# Patient Record
Sex: Male | Born: 1950
Health system: Southern US, Community
[De-identification: ages and names within clinical notes are randomized; demographics above are authoritative.]

## PROBLEM LIST (undated history)

## (undated) DIAGNOSIS — I209 Angina pectoris, unspecified: Secondary | ICD-10-CM

## (undated) DIAGNOSIS — K219 Gastro-esophageal reflux disease without esophagitis: Secondary | ICD-10-CM

## (undated) DIAGNOSIS — M1990 Unspecified osteoarthritis, unspecified site: Secondary | ICD-10-CM

## (undated) DIAGNOSIS — E785 Hyperlipidemia, unspecified: Secondary | ICD-10-CM

## (undated) HISTORY — DX: Gastro-esophageal reflux disease without esophagitis: K21.9

## (undated) HISTORY — DX: Unspecified osteoarthritis, unspecified site: M19.90

## (undated) HISTORY — DX: Hyperlipidemia, unspecified: E78.5

---

## 1997-07-05 HISTORY — PX: APPENDECTOMY: SHX54

## 2010-09-09 LAB — HM COLONOSCOPY

## 2011-08-25 LAB — PSA: PSA: 0.8

## 2012-07-06 ENCOUNTER — Ambulatory Visit: Payer: Self-pay | Admitting: Family Medicine

## 2012-07-07 ENCOUNTER — Ambulatory Visit: Payer: Self-pay | Admitting: Family Medicine

## 2012-10-10 ENCOUNTER — Ambulatory Visit: Payer: Self-pay | Admitting: Family Medicine

## 2013-02-26 ENCOUNTER — Encounter: Payer: Self-pay | Admitting: *Deleted

## 2013-03-14 ENCOUNTER — Ambulatory Visit (INDEPENDENT_AMBULATORY_CARE_PROVIDER_SITE_OTHER): Payer: BC Managed Care – PPO | Admitting: General Surgery

## 2013-03-14 ENCOUNTER — Encounter: Payer: Self-pay | Admitting: General Surgery

## 2013-03-14 VITALS — BP 142/76 | HR 68 | Resp 14 | Ht 72.0 in | Wt 214.0 lb

## 2013-03-14 DIAGNOSIS — K8020 Calculus of gallbladder without cholecystitis without obstruction: Secondary | ICD-10-CM | POA: Insufficient documentation

## 2013-03-14 NOTE — Patient Instructions (Addendum)
Patient to return as needed. 

## 2013-03-14 NOTE — Progress Notes (Signed)
Patient ID: Ryan English, male   DOB: Apr 05, 1951, 62 y.o.   MRN: [id number]  Chief Complaint  Patient presents with  . Other    evaluation of gallstones    HPI Ryan English is a 62 y.o. male who presents for an evaluation of gallstones. The patient was referred by Dr. Elease Hashimoto. The patient went for a Chest CT in January 2014 where gallstones were present. The patient denies any problems or symptoms at this time.  On questioning the patient has no dietary intolerance. He has not appreciated postprandial belching, distention, diarrhea or pain.  HPI  Past Medical History  Diagnosis Date  . GERD (gastroesophageal reflux disease)   . Arthritis   . Hyperlipidemia     Past Surgical History  Procedure Laterality Date  . Appendectomy  1999    History reviewed. No pertinent family history.  Social History History  Substance Use Topics  . Smoking status: Never Smoker   . Smokeless tobacco: Not on file  . Alcohol Use: Yes    No Known Allergies  Current Outpatient Prescriptions  Medication Sig Dispense Refill  . aspirin 81 MG tablet Take 81 mg by mouth daily.      Marland Kitchen atorvastatin (LIPITOR) 40 MG tablet Take 1 tablet by mouth daily.      . Misc Natural Products (OSTEO BI-FLEX JOINT SHIELD PO) Take 2 tablets by mouth daily.      . Omega-3 Fatty Acids (FISH OIL) 1200 MG CAPS Take 1 capsule by mouth daily.      Marland Kitchen omeprazole (PRILOSEC) 20 MG capsule Take 20 mg by mouth daily.      Marland Kitchen Phenylephrine-DM-GG-APAP (SUDAFED PE COLD/COUGH PO) Take 10 mg by mouth daily.       No current facility-administered medications for this visit.    Review of Systems Review of Systems  Constitutional: Negative.   Respiratory: Negative.   Cardiovascular: Negative.   Gastrointestinal: Negative.     Blood pressure 142/76, pulse 68, resp. rate 14, height 6' (1.829 m), weight 214 lb (97.07 kg).  Physical Exam Physical Exam  Constitutional: He is oriented to person, place, and time. He appears  well-developed and well-nourished.  Neck: No thyromegaly present.  Cardiovascular: Normal rate, regular rhythm and normal heart sounds.   No murmur heard. Pulmonary/Chest: Effort normal and breath sounds normal.  Abdominal: Soft. Bowel sounds are normal. There is no tenderness.  Lymphadenopathy:    He has no cervical adenopathy.  Neurological: He is alert and oriented to person, place, and time.  Skin: Skin is warm and dry.    Data Reviewed PCP Notes.  CT scan dated 07/07/2012 completed for a cough and fever showed no evidence of pulmonary infiltrate. Gallstones were identified. 1.3 cm right adrenal nodule noted for which MRI was suggested multiple pinpoint pulmonary nodules consistent with granulomas.  Review of the CT showed the gallstones to be calcified suggesting that they've been there for quite some time.    Assessment    Cholelithiasis without any symptoms to suggest cholecystitis.    Plan    The patient is in good health, totally asymptomatic and with no history of diabetes. Typical symptoms to suggest the gallstones are becoming symptomatic were reviewed. Should he experience any symptoms he was encouraged to call for assessment, but otherwise would defer elective cholecystectomy.       Ryan English 03/16/2013, 5:00 PM

## 2013-03-16 ENCOUNTER — Encounter: Payer: Self-pay | Admitting: General Surgery

## 2013-05-08 ENCOUNTER — Ambulatory Visit: Payer: Self-pay | Admitting: Family Medicine

## 2013-08-28 ENCOUNTER — Ambulatory Visit: Payer: Self-pay | Admitting: Family Medicine

## 2014-06-21 ENCOUNTER — Other Ambulatory Visit: Payer: Self-pay | Admitting: Orthopaedic Surgery

## 2014-06-21 DIAGNOSIS — M4716 Other spondylosis with myelopathy, lumbar region: Secondary | ICD-10-CM

## 2014-06-25 ENCOUNTER — Ambulatory Visit
Admission: RE | Admit: 2014-06-25 | Discharge: 2014-06-25 | Disposition: A | Discharge status: Home / Self Care | Payer: BC Managed Care – PPO | Source: Ambulatory Visit | Attending: Orthopaedic Surgery | Admitting: Orthopaedic Surgery

## 2014-06-25 DIAGNOSIS — M4716 Other spondylosis with myelopathy, lumbar region: Secondary | ICD-10-CM

## 2014-08-02 DIAGNOSIS — R001 Bradycardia, unspecified: Secondary | ICD-10-CM | POA: Insufficient documentation

## 2014-08-02 DIAGNOSIS — E782 Mixed hyperlipidemia: Secondary | ICD-10-CM | POA: Insufficient documentation

## 2014-12-31 ENCOUNTER — Ambulatory Visit: Payer: Self-pay

## 2015-04-09 ENCOUNTER — Telehealth: Payer: Self-pay | Admitting: Family Medicine

## 2015-04-09 DIAGNOSIS — J3089 Other allergic rhinitis: Secondary | ICD-10-CM

## 2015-04-09 DIAGNOSIS — J309 Allergic rhinitis, unspecified: Secondary | ICD-10-CM | POA: Insufficient documentation

## 2015-04-09 MED ORDER — MONTELUKAST SODIUM 10 MG PO TABS: 10.0000 mg | ORAL_TABLET | Freq: Every day | ORAL | Status: DC

## 2015-04-09 NOTE — Telephone Encounter (Signed)
Pt is returning call.  CB#[phone]/MW

## 2015-04-09 NOTE — Telephone Encounter (Signed)
Pt states he has some drainage and coughing with no fever.Pt would like to see if you would be willing to call in something for him to [facility].

## 2015-04-09 NOTE — Telephone Encounter (Signed)
Patient reports that he has PND, scratchy throat, runny nose, and cough X 3 days. Patient denies any fever or shortness of breath. Patient reports that the drainage is his main complaint. He denies any ear pain or itchy watery eyes. He has not been taking anything OTC for symptoms. Patient is wondering if something can be called  Into the pharmacy. [facility] [place]. Please advise. Thanks!

## 2015-04-11 NOTE — Telephone Encounter (Signed)
See other note; this is completed.   Thanks,   -Mickel Baas

## 2015-05-26 ENCOUNTER — Other Ambulatory Visit: Payer: Self-pay

## 2015-05-26 ENCOUNTER — Other Ambulatory Visit
Admission: RE | Admit: 2015-05-26 | Discharge: 2015-05-26 | Disposition: A | Discharge status: Home / Self Care | Payer: BLUE CROSS/BLUE SHIELD | Source: Ambulatory Visit | Attending: Family Medicine | Admitting: Family Medicine

## 2015-05-26 ENCOUNTER — Encounter: Payer: Self-pay | Admitting: Family Medicine

## 2015-05-26 ENCOUNTER — Telehealth: Payer: Self-pay

## 2015-05-26 ENCOUNTER — Ambulatory Visit (INDEPENDENT_AMBULATORY_CARE_PROVIDER_SITE_OTHER): Payer: BLUE CROSS/BLUE SHIELD | Admitting: Family Medicine

## 2015-05-26 VITALS — BP 128/66 | HR 60 | Temp 97.7°F | Resp 16 | Ht 71.5 in | Wt 214.0 lb

## 2015-05-26 DIAGNOSIS — R0789 Other chest pain: Secondary | ICD-10-CM | POA: Insufficient documentation

## 2015-05-26 DIAGNOSIS — D3500 Benign neoplasm of unspecified adrenal gland: Secondary | ICD-10-CM | POA: Insufficient documentation

## 2015-05-26 DIAGNOSIS — R001 Bradycardia, unspecified: Secondary | ICD-10-CM | POA: Diagnosis not present

## 2015-05-26 DIAGNOSIS — I2510 Atherosclerotic heart disease of native coronary artery without angina pectoris: Secondary | ICD-10-CM

## 2015-05-26 DIAGNOSIS — K649 Unspecified hemorrhoids: Secondary | ICD-10-CM | POA: Insufficient documentation

## 2015-05-26 DIAGNOSIS — H6990 Unspecified Eustachian tube disorder, unspecified ear: Secondary | ICD-10-CM | POA: Insufficient documentation

## 2015-05-26 DIAGNOSIS — H6980 Other specified disorders of Eustachian tube, unspecified ear: Secondary | ICD-10-CM | POA: Insufficient documentation

## 2015-05-26 DIAGNOSIS — K8020 Calculus of gallbladder without cholecystitis without obstruction: Secondary | ICD-10-CM | POA: Insufficient documentation

## 2015-05-26 LAB — TROPONIN I

## 2015-05-26 LAB — CKMB (ARMC ONLY): CK, MB: 2.1 ng/mL (ref 0.5–5.0)

## 2015-05-26 NOTE — Telephone Encounter (Signed)
Pt called c/o chest pain x 4-5 days. Pt reports it is left sided, and is a constant "pressure". Denies crushing sensation. Reports it is relieved with applying pressure to the chest. Pt also experiencing lightheadedness/dizziness. Denies N/V. Pt reports he has left arm arthritis, and could not tell me if arm pain is abnormal or worsening. Pt also is unsure if he is experiencing SOB. Pt reports he is stable, and only called [place] at his wife's urging. Advised pt to check BP, and to report to ED if sx worsen. Appointment made for 12:00 today. Renaldo Fiddler, CMA

## 2015-05-26 NOTE — Progress Notes (Signed)
Name: ESTELLA VERDIER   MRN: [id number]    DOB: June 07, 1951   Date:05/26/2015       Progress Note  Subjective  Chief Complaint  Chief Complaint  Patient presents with  . Chest Pain    HPI  64 yo male started with ache in left chest since last Tuesday.  Arm pain has not changed. Notices it more in the evening.  Is light headed. Feels like he is in a fog. Dizzy when closing eyes. Walking fine.   Notices it more with sitting on the couch. No sharp pain. Constant ache.  Rubbing it helps.  Makes it feel better.  Does not make any difference with activity.  No SOB.  No nausea. No jaw pain.  Does not impact his ADLs. Kept grandkids this week.  Did see cardiology earlier this year.  Had stress test twice.  Not had cath.  No reflux.  Has minimal coronary artery disease and bradycardia.  Does feel better with pressure.     Past Medical History  Diagnosis Date  . GERD (gastroesophageal reflux disease)   . Arthritis   . Hyperlipidemia     Social History  Substance Use Topics  . Smoking status: Never Smoker   . Smokeless tobacco: Never Used  . Alcohol Use: Yes     Current outpatient prescriptions:  .  aspirin 81 MG tablet, Take 81 mg by mouth daily., Disp: , Rfl:  .  atorvastatin (LIPITOR) 40 MG tablet, Take 1 tablet by mouth daily., Disp: , Rfl:  .  cyclobenzaprine (FLEXERIL) 10 MG tablet, Take 10 mg by mouth 3 (three) times daily as needed for muscle spasms., Disp: , Rfl:  .  gabapentin (NEURONTIN) 300 MG capsule, , Disp: , Rfl: 1 .  loratadine (CLARITIN) 10 MG tablet, Take by mouth., Disp: , Rfl:  .  meloxicam (MOBIC) 15 MG tablet, Take by mouth., Disp: , Rfl:  .  Misc Natural Products (OSTEO BI-FLEX JOINT SHIELD PO), Take 2 tablets by mouth daily., Disp: , Rfl:  .  montelukast (SINGULAIR) 10 MG tablet, Take 1 tablet (10 mg total) by mouth at bedtime., Disp: 30 tablet, Rfl: 3 .  Omega-3 Fatty Acids (FISH OIL) 1200 MG CAPS, Take 1 capsule by mouth daily., Disp: , Rfl:  .  omeprazole  (PRILOSEC) 20 MG capsule, Take 20 mg by mouth daily., Disp: , Rfl:  .  Phenylephrine-DM-GG-APAP (SUDAFED PE COLD/COUGH PO), Take 10 mg by mouth daily., Disp: , Rfl:   No Known Allergies  Review of Systems  Constitutional: Negative.   HENT: Negative.   Cardiovascular: Positive for chest pain. Negative for palpitations, orthopnea, claudication and leg swelling.  Musculoskeletal: Positive for myalgias. Negative for back pain and joint pain.  Neurological: Positive for dizziness.  Psychiatric/Behavioral: Negative.     Objective  Filed Vitals:   05/26/15 1229  BP: 128/66  Pulse: 60  Temp: 97.7 F (36.5 C)  TempSrc: Oral  Resp: 16  Height: 5' 11.5" (1.816 m)  Weight: 214 lb (97.07 kg)     Physical Exam  Constitutional: He is oriented to person, place, and time and well-developed, well-nourished, and in no distress.  HENT:  Head: Normocephalic and atraumatic.  Right Ear: External ear normal.  Left Ear: External ear normal.  Nose: Nose normal.  Mouth/Throat: Oropharynx is clear and moist.  Eyes: Pupils are equal, round, and reactive to light.  Neck: Normal range of motion. Neck supple.  Cardiovascular: Normal rate and regular rhythm.   Pulmonary/Chest: Effort normal.  Musculoskeletal: He exhibits tenderness.  Left chest wall in area where he describes the pain. Does duplicate and worsen pain he has come in for.   Neurological: He is alert and oriented to person, place, and time.  Skin: Skin is warm and dry.  Psychiatric: Mood, memory, affect and judgment normal.   1. Atypical chest pain New problem. EKG unchanged from previous. Has known history of bradycardia.  Suspect chest wall pain. Will treat as needed if does not resolve. Will check enzymes to rule out cardiac etiology although suspicion is low.   - EKG 12-Lead  2. Atherosclerosis of native coronary artery of native heart without angina pectoris Does have mild plaque on CT. Strong FH.  Normal stress tests. Will check  enzymes to be safe.  Ordered stat CK- MB and troponin.  Further plan pending these results.  Continue current medication.     3. Bradycardia Asymptomatic.  Will refer as needed.    Margarita Rana, MD

## 2015-06-06 ENCOUNTER — Emergency Department
Admission: EM | Admit: 2015-06-06 | Discharge: 2015-06-06 | Disposition: A | Discharge status: Home / Self Care | Payer: BLUE CROSS/BLUE SHIELD | Attending: Emergency Medicine | Admitting: Emergency Medicine

## 2015-06-06 ENCOUNTER — Telehealth: Payer: Self-pay

## 2015-06-06 ENCOUNTER — Emergency Department: Payer: BLUE CROSS/BLUE SHIELD

## 2015-06-06 DIAGNOSIS — R079 Chest pain, unspecified: Secondary | ICD-10-CM | POA: Diagnosis present

## 2015-06-06 DIAGNOSIS — Z791 Long term (current) use of non-steroidal anti-inflammatories (NSAID): Secondary | ICD-10-CM | POA: Insufficient documentation

## 2015-06-06 DIAGNOSIS — Z7982 Long term (current) use of aspirin: Secondary | ICD-10-CM | POA: Insufficient documentation

## 2015-06-06 DIAGNOSIS — R42 Dizziness and giddiness: Secondary | ICD-10-CM | POA: Diagnosis not present

## 2015-06-06 LAB — CBC
HCT: 41.1 % (ref 40.0–52.0)
HEMOGLOBIN: 14.4 g/dL (ref 13.0–18.0)
MCH: 29.7 pg (ref 26.0–34.0)
MCHC: 35.1 g/dL (ref 32.0–36.0)
MCV: 84.6 fL (ref 80.0–100.0)
Platelets: 165 10*3/uL (ref 150–440)
RBC: 4.85 MIL/uL (ref 4.40–5.90)
RDW: 13.2 % (ref 11.5–14.5)
WBC: 6.2 10*3/uL (ref 3.8–10.6)

## 2015-06-06 LAB — BASIC METABOLIC PANEL
ANION GAP: 7 (ref 5–15)
BUN: 13 mg/dL (ref 6–20)
CHLORIDE: 107 mmol/L (ref 101–111)
CO2: 26 mmol/L (ref 22–32)
Calcium: 9.3 mg/dL (ref 8.9–10.3)
Creatinine, Ser: 0.86 mg/dL (ref 0.61–1.24)
GFR calc Af Amer: >60 mL/min (ref >60)
Glucose, Bld: 83 mg/dL (ref 65–99)
POTASSIUM: 3.8 mmol/L (ref 3.5–5.1)
SODIUM: 140 mmol/L (ref 135–145)

## 2015-06-06 LAB — TROPONIN I

## 2015-06-06 NOTE — ED Notes (Signed)
Patient transported to X-ray 

## 2015-06-06 NOTE — ED Notes (Signed)
Pt in via triage w/ intermittent chest pain x 1 week.  Pt reports dizziness and some SOB w/ pain.  Pt A/Ox4, vitals WDL, no immediate distress at this time.

## 2015-06-06 NOTE — Discharge Instructions (Signed)
Please seek medical attention for any high fevers, chest pain, shortness of breath, change in behavior, persistent vomiting, bloody stool or any other new or concerning symptoms. ° ° °Nonspecific Chest Pain  °Chest pain can be caused by many different conditions. There is always a chance that your pain could be related to something serious, such as a heart attack or a blood clot in your lungs. Chest pain can also be caused by conditions that are not life-threatening. If you have chest pain, it is very important to follow up with your health care provider. °CAUSES  °Chest pain can be caused by: °· Heartburn. °· Pneumonia or bronchitis. °· Anxiety or stress. °· Inflammation around your heart (pericarditis) or lung (pleuritis or pleurisy). °· A blood clot in your lung. °· A collapsed lung (pneumothorax). It can develop suddenly on its own (spontaneous pneumothorax) or from trauma to the chest. °· Shingles infection (varicella-zoster virus). °· Heart attack. °· Damage to the bones, muscles, and cartilage that make up your chest wall. This can include: °¨ Bruised bones due to injury. °¨ Strained muscles or cartilage due to frequent or repeated coughing or overwork. °¨ Fracture to one or more ribs. °¨ Sore cartilage due to inflammation (costochondritis). °RISK FACTORS  °Risk factors for chest pain may include: °· Activities that increase your risk for trauma or injury to your chest. °· Respiratory infections or conditions that cause frequent coughing. °· Medical conditions or overeating that can cause heartburn. °· Heart disease or family history of heart disease. °· Conditions or health behaviors that increase your risk of developing a blood clot. °· Having had chicken pox (varicella zoster). °SIGNS AND SYMPTOMS °Chest pain can feel like: °· Burning or tingling on the surface of your chest or deep in your chest. °· Crushing, pressure, aching, or squeezing pain. °· Dull or sharp pain that is worse when you move, cough, or  take a deep breath. °· Pain that is also felt in your back, neck, shoulder, or arm, or pain that spreads to any of these areas. °Your chest pain may come and go, or it may stay constant. °DIAGNOSIS °Lab tests or other studies may be needed to find the cause of your pain. Your health care provider may have you take a test called an ambulatory ECG (electrocardiogram). An ECG records your heartbeat patterns at the time the test is performed. You may also have other tests, such as: °· Transthoracic echocardiogram (TTE). During echocardiography, sound waves are used to create a picture of all of the heart structures and to look at how blood flows through your heart. °· Transesophageal echocardiogram (TEE). This is a more advanced imaging test that obtains images from inside your body. It allows your health care provider to see your heart in finer detail. °· Cardiac monitoring. This allows your health care provider to monitor your heart rate and rhythm in real time. °· Holter monitor. This is a portable device that records your heartbeat and can help to diagnose abnormal heartbeats. It allows your health care provider to track your heart activity for several days, if needed. °· Stress tests. These can be done through exercise or by taking medicine that makes your heart beat more quickly. °· Blood tests. °· Imaging tests. °TREATMENT  °Your treatment depends on what is causing your chest pain. Treatment may include: °· Medicines. These may include: °¨ Acid blockers for heartburn. °¨ Anti-inflammatory medicine. °¨ Pain medicine for inflammatory conditions. °¨ Antibiotic medicine, if an infection is present. °¨ Medicines   to dissolve blood clots. °¨ Medicines to treat coronary artery disease. °· Supportive care for conditions that do not require medicines. This may include: °¨ Resting. °¨ Applying heat or cold packs to injured areas. °¨ Limiting activities until pain decreases. °HOME CARE INSTRUCTIONS °· If you were prescribed  an antibiotic medicine, finish it all even if you start to feel better. °· Avoid any activities that bring on chest pain. °· Do not use any tobacco products, including cigarettes, chewing tobacco, or electronic cigarettes. If you need help quitting, ask your health care provider. °· Do not drink alcohol. °· Take medicines only as directed by your health care provider. °· Keep all follow-up visits as directed by your health care provider. This is important. This includes any further testing if your chest pain does not go away. °· If heartburn is the cause for your chest pain, you may be told to keep your head raised (elevated) while sleeping. This reduces the chance that acid will go from your stomach into your esophagus. °· Make lifestyle changes as directed by your health care provider. These may include: °¨ Getting regular exercise. Ask your health care provider to suggest some activities that are safe for you. °¨ Eating a heart-healthy diet. A registered dietitian can help you to learn healthy eating options. °¨ Maintaining a healthy weight. °¨ Managing diabetes, if necessary. °¨ Reducing stress. °SEEK MEDICAL CARE IF: °· Your chest pain does not go away after treatment. °· You have a rash with blisters on your chest. °· You have a fever. °SEEK IMMEDIATE MEDICAL CARE IF:  °· Your chest pain is worse. °· You have an increasing cough, or you cough up blood. °· You have severe abdominal pain. °· You have severe weakness. °· You faint. °· You have chills. °· You have sudden, unexplained chest discomfort. °· You have sudden, unexplained discomfort in your arms, back, neck, or jaw. °· You have shortness of breath at any time. °· You suddenly start to sweat, or your skin gets clammy. °· You feel nauseous or you vomit. °· You suddenly feel light-headed or dizzy. °· Your heart begins to beat quickly, or it feels like it is skipping beats. °These symptoms may represent a serious problem that is an emergency. Do not wait to  see if the symptoms will go away. Get medical help right away. Call your local emergency services (911 in the [place] Do not drive yourself to the hospital. °  °This information is not intended to replace advice given to you by your health care provider. Make sure you discuss any questions you have with your health care provider. °  °Document Released: 03/31/2005 Document Revised: 07/12/2014 Document Reviewed: 01/25/2014 °Elsevier Interactive Patient Education ©2016 [employer] ° °

## 2015-06-06 NOTE — Telephone Encounter (Signed)
Please see if pain is with palpation, if not, needs to follow up with his cardiologist.Light headed and uneasy sounds like a change. ER if seems like symptoms have worsened. I will be unable to be reached after 3 today.   Thanks.

## 2015-06-06 NOTE — Telephone Encounter (Signed)
Patient called c/o of occasional chest pain that it bothersome. He reports that he was seen in the office on 05/26/15 and an EKG and labs were done. Patient reports that this was normal. However, patient is still experiencing pain. He denies any shortness of breath, but he does feel light-headed and un-easy. He states that the nurse at his job checked his BP and it was 140/76 which is stable for him. He is not sure of what else to do. Any recommendations? Please advise. Contact number is correct (mobile number). Thanks!

## 2015-06-06 NOTE — ED Provider Notes (Signed)
[facility] Emergency Department Provider Note    ____________________________________________  Time seen: 1700  I have reviewed the triage vital signs and the nursing notes.   HISTORY  Chief Complaint Chest Pain   History limited by: Not Limited   HPI Ryan English is a 64 y.o. male who presents to the emergency department today with concerns for intermittent chest pain and dizziness. He states that the chest pain has been over the course of the past week and a half. He states that the pain will sometimes last the full day. He states it is somewhat worse at night. He has not noticed any thing he can directly do that makes the pain better or worse. He states he has not had any associated shortness of breath. He has not had any diaphoresis. He does state that he has felt lightheaded with this pain. This morning it was the worst and he felt lightheaded and dizzy. States this has improved throughout the day.   Past Medical History  Diagnosis Date  . GERD (gastroesophageal reflux disease)   . Arthritis   . Hyperlipidemia     Patient Active Problem List   Diagnosis Date Noted  . Abnormal blood sugar 05/26/2015  . Adrenal adenoma 05/26/2015  . Benign paroxysmal positional nystagmus 05/26/2015  . Atherosclerosis of coronary artery 05/26/2015  . Biliary calculi 05/26/2015  . Dysfunction of eustachian tube 05/26/2015  . Hemorrhoid 05/26/2015  . 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 Hypercholesterolemia 05/26/2015  . Lung nodule, multiple 05/26/2015  . Atypical chest pain 05/26/2015  . Allergic rhinitis 04/09/2015  . Bradycardia 08/02/2014  . Combined fat and carbohydrate induced hyperlipemia 08/02/2014  . Gallstone 03/14/2013    Past Surgical History  Procedure Laterality Date  . Appendectomy  1999    Current Outpatient Rx  Name  Route  Sig  Dispense  Refill  . aspirin 81 MG tablet   Oral   Take 81 mg by mouth daily.         Marland Kitchen atorvastatin  (LIPITOR) 40 MG tablet   Oral   Take 1 tablet by mouth daily.         . cyclobenzaprine (FLEXERIL) 10 MG tablet   Oral   Take 10 mg by mouth 3 (three) times daily as needed for muscle spasms.         Marland Kitchen gabapentin (NEURONTIN) 300 MG capsule            1   . loratadine (CLARITIN) 10 MG tablet   Oral   Take by mouth.         . meloxicam (MOBIC) 15 MG tablet   Oral   Take by mouth.         . Misc Natural Products (OSTEO BI-FLEX JOINT SHIELD PO)   Oral   Take 2 tablets by mouth daily.         . montelukast (SINGULAIR) 10 MG tablet   Oral   Take 1 tablet (10 mg total) by mouth at bedtime.   30 tablet   3   . Omega-3 Fatty Acids (FISH OIL) 1200 MG CAPS   Oral   Take 1 capsule by mouth daily.         Marland Kitchen omeprazole (PRILOSEC) 20 MG capsule   Oral   Take 20 mg by mouth daily.         Marland Kitchen Phenylephrine-DM-GG-APAP (SUDAFED PE COLD/COUGH PO)   Oral   Take 10 mg by mouth daily.  Allergies Review of patient's allergies indicates no known allergies.  History reviewed. No pertinent family history.  Social History Social History  Substance Use Topics  . Smoking status: Never Smoker   . Smokeless tobacco: Never Used  . Alcohol Use: Yes    Review of Systems  Constitutional: Negative for fever. Cardiovascular: Positive for chest pain. Respiratory: Negative for shortness of breath. Gastrointestinal: Negative for abdominal pain, vomiting and diarrhea. Genitourinary: Negative for dysuria. Musculoskeletal: Negative for back pain. Skin: Negative for rash. Neurological: Negative for headaches, focal weakness or numbness. Positive for dizziness.  10-point ROS otherwise negative.  ____________________________________________   PHYSICAL EXAM:  VITAL SIGNS: ED Triage Vitals  Enc Vitals Group     BP 06/06/15 1606 134/81 mmHg     Pulse Rate 06/06/15 1606 65     Resp 06/06/15 1606 16     Temp 06/06/15 1606 98.1 F (36.7 C)     Temp src --       SpO2 06/06/15 1606 95 %     Weight 06/06/15 1606 212 lb (96.163 kg)     Height 06/06/15 1606 5\' 11"  (1.803 m)     Head Cir --      Peak Flow --      Pain Score 06/06/15 1605 5   Constitutional: Alert and oriented. Well appearing and in no distress. Eyes: Conjunctivae are normal. PERRL. Normal extraocular movements. ENT   Head: Normocephalic and atraumatic.   Nose: No congestion/rhinnorhea.   Mouth/Throat: Mucous membranes are moist.   Neck: No stridor. Hematological/Lymphatic/Immunilogical: No cervical lymphadenopathy. Cardiovascular: Normal rate, regular rhythm.  No murmurs, rubs, or gallops. Respiratory: Normal respiratory effort without tachypnea nor retractions. Breath sounds are clear and equal bilaterally. No wheezes/rales/rhonchi. Gastrointestinal: Soft and nontender. No distention. Genitourinary: Deferred Musculoskeletal: Normal range of motion in all extremities. No joint effusions.  No lower extremity tenderness nor edema. Neurologic:  Normal speech and language. No gross focal neurologic deficits are appreciated.  Skin:  Skin is warm, dry and intact. No rash noted. Psychiatric: Mood and affect are normal. Speech and behavior are normal. Patient exhibits appropriate insight and judgment.  ____________________________________________    LABS (pertinent positives/negatives)  Labs Reviewed  CBC  BASIC METABOLIC PANEL  TROPONIN I     ____________________________________________   EKG  I, Nance Pear, attending physician, personally viewed and interpreted this EKG  EKG Time: 1600 Rate: 65 Rhythm: NSr Axis: normal Intervals: qtc 426 QRS: narrow ST changes: no st elevation Impression: normal ekg ____________________________________________    RADIOLOGY  CXR IMPRESSION: Minimal chronic bronchitic changes without acute infiltrate.   ____________________________________________   PROCEDURES  Procedure(s) performed: None  Critical  Care performed: No  ____________________________________________   INITIAL IMPRESSION / ASSESSMENT AND PLAN / ED COURSE  Pertinent labs & imaging results that were available during my care of the patient were reviewed by me and considered in my medical decision making (see chart for details).  Patient presented to the emergency department today with 1-1/2 week concern of intermittent chest pain with dizziness. 2 sets of troponin negative. Patient's EKG without any ST elevation. Discussed with patient that I would like to follow up with cardiology although given her workup today think ACS unlikely. No other clear etiology of the patient's symptoms. Encourage primary care follow-up.  ____________________________________________   FINAL CLINICAL IMPRESSION(S) / ED DIAGNOSES  Final diagnoses:  Chest pain, unspecified chest pain type  Dizziness     Nance Pear, MD 06/06/15 2007

## 2015-06-06 NOTE — ED Notes (Addendum)
Patient comes in with intermittent chest pain for 1.5 weeks, but today chest pain has gotten worse and is associated with dizziness today.  Patient complains of weakness with left sided chest pain that radiates to left arm.  Patient denies nausea or vomiting.

## 2015-06-06 NOTE — Telephone Encounter (Signed)
I spoke with [name], he says he does have some shortness of breath, he says it could be a cold though.  But he is becoming more dizzy and unsteady.  I suggested that he should go to the ER and be evaluated and he agreed to go.  I called over the ER Triage nurse and advised her he was on his way.   Thanks,   -Mickel Baas

## 2015-06-07 NOTE — Telephone Encounter (Signed)
Spoke with patient and he reports that he has still had some dizzy spells. However, they are much better than yesterday. He reports that they did draw labs and did tests which he does not know the results on. He reports that they did not schedule his appt with cardiology because it was after 8 pm when he was discharged. Patient reports that he will call 1st thing on Monday to schedule appt. He sends his thanks for calling and checking on him.

## 2015-06-07 NOTE — Telephone Encounter (Signed)
Patient discharged from ER Friday.  Sounds like need follow up with Dr. Nehemiah Massed. Please see if this has been scheduled. Thanks.

## 2015-06-16 ENCOUNTER — Other Ambulatory Visit: Payer: Self-pay | Admitting: Orthopedic Surgery

## 2015-06-16 DIAGNOSIS — M4316 Spondylolisthesis, lumbar region: Secondary | ICD-10-CM

## 2015-06-26 ENCOUNTER — Ambulatory Visit
Admission: RE | Admit: 2015-06-26 | Discharge: 2015-06-26 | Disposition: A | Discharge status: Home / Self Care | Payer: BLUE CROSS/BLUE SHIELD | Source: Ambulatory Visit | Attending: Orthopedic Surgery | Admitting: Orthopedic Surgery

## 2015-06-26 DIAGNOSIS — M4316 Spondylolisthesis, lumbar region: Secondary | ICD-10-CM

## 2015-07-31 ENCOUNTER — Other Ambulatory Visit: Payer: Self-pay | Admitting: Family Medicine

## 2015-07-31 DIAGNOSIS — J3089 Other allergic rhinitis: Secondary | ICD-10-CM

## 2015-08-12 ENCOUNTER — Ambulatory Visit (INDEPENDENT_AMBULATORY_CARE_PROVIDER_SITE_OTHER): Payer: BLUE CROSS/BLUE SHIELD | Admitting: Physician Assistant

## 2015-08-12 ENCOUNTER — Encounter: Payer: Self-pay | Admitting: Physician Assistant

## 2015-08-12 VITALS — BP 128/72 | HR 70 | Temp 98.3°F | Resp 16 | Wt 218.0 lb

## 2015-08-12 DIAGNOSIS — N4829 Other inflammatory disorders of penis: Secondary | ICD-10-CM | POA: Diagnosis not present

## 2015-08-12 DIAGNOSIS — J069 Acute upper respiratory infection, unspecified: Secondary | ICD-10-CM | POA: Diagnosis not present

## 2015-08-12 MED ORDER — AZITHROMYCIN 250 MG PO TABS: ORAL_TABLET | ORAL | Status: DC

## 2015-08-12 MED ORDER — BACITRACIN 500 UNIT/GM OP OINT: TOPICAL_OINTMENT | OPHTHALMIC | Status: DC

## 2015-08-12 NOTE — Patient Instructions (Signed)
Upper Respiratory Infection, Adult Most upper respiratory infections (URIs) are a viral infection of the air passages leading to the lungs. A URI affects the nose, throat, and upper air passages. The most common type of URI is nasopharyngitis and is typically referred to as "the common cold." URIs run their course and usually go away on their own. Most of the time, a URI does not require medical attention, but sometimes a bacterial infection in the upper airways can follow a viral infection. This is called a secondary infection. Sinus and middle ear infections are common types of secondary upper respiratory infections. Bacterial pneumonia can also complicate a URI. A URI can worsen asthma and chronic obstructive pulmonary disease (COPD). Sometimes, these complications can require emergency medical care and may be life threatening.  CAUSES Almost all URIs are caused by viruses. A virus is a type of germ and can spread from one person to another.  RISKS FACTORS You may be at risk for a URI if:   You smoke.   You have chronic heart or lung disease.  You have a weakened defense (immune) system.   You are very young or very old.   You have nasal allergies or asthma.  You work in crowded or poorly ventilated areas.  You work in health care facilities or schools. SIGNS AND SYMPTOMS  Symptoms typically develop 2-3 days after you come in contact with a cold virus. Most viral URIs last 7-10 days. However, viral URIs from the influenza virus (flu virus) can last 14-18 days and are typically more severe. Symptoms may include:   Runny or stuffy (congested) nose.   Sneezing.   Cough.   Sore throat.   Headache.   Fatigue.   Fever.   Loss of appetite.   Pain in your forehead, behind your eyes, and over your cheekbones (sinus pain).  Muscle aches.  DIAGNOSIS  Your health care provider may diagnose a URI by:  Physical exam.  Tests to check that your symptoms are not due to  another condition such as:  Strep throat.  Sinusitis.  Pneumonia.  Asthma. TREATMENT  A URI goes away on its own with time. It cannot be cured with medicines, but medicines may be prescribed or recommended to relieve symptoms. Medicines may help:  Reduce your fever.  Reduce your cough.  Relieve nasal congestion. HOME CARE INSTRUCTIONS   Take medicines only as directed by your health care provider.   Gargle warm saltwater or take cough drops to comfort your throat as directed by your health care provider.  Use a warm mist humidifier or inhale steam from a shower to increase air moisture. This may make it easier to breathe.  Drink enough fluid to keep your urine clear or pale yellow.   Eat soups and other clear broths and maintain good nutrition.   Rest as needed.   Return to work when your temperature has returned to normal or as your health care provider advises. You may need to stay home longer to avoid infecting others. You can also use a face mask and careful hand washing to prevent spread of the virus.  Increase the usage of your inhaler if you have asthma.   Do not use any tobacco products, including cigarettes, chewing tobacco, or electronic cigarettes. If you need help quitting, ask your health care provider. PREVENTION  The best way to protect yourself from getting a cold is to practice good hygiene.   Avoid oral or hand contact with people with cold   symptoms.   Wash your hands often if contact occurs.  There is no clear evidence that vitamin C, vitamin E, echinacea, or exercise reduces the chance of developing a cold. However, it is always recommended to get plenty of rest, exercise, and practice good nutrition.  SEEK MEDICAL CARE IF:   You are getting worse rather than better.   Your symptoms are not controlled by medicine.   You have chills.  You have worsening shortness of breath.  You have brown or red mucus.  You have yellow or brown nasal  discharge.  You have pain in your face, especially when you bend forward.  You have a fever.  You have swollen neck glands.  You have pain while swallowing.  You have white areas in the back of your throat. SEEK IMMEDIATE MEDICAL CARE IF:   You have severe or persistent:  Headache.  Ear pain.  Sinus pain.  Chest pain.  You have chronic lung disease and any of the following:  Wheezing.  Prolonged cough.  Coughing up blood.  A change in your usual mucus.  You have a stiff neck.  You have changes in your:  Vision.  Hearing.  Thinking.  Mood. MAKE SURE YOU:   Understand these instructions.  Will watch your condition.  Will get help right away if you are not doing well or get worse.   This information is not intended to replace advice given to you by your health care provider. Make sure you discuss any questions you have with your health care provider.   Document Released: 12/15/2000 Document Revised: 11/05/2014 Document Reviewed: 09/26/2013 Elsevier Interactive Patient Education 2016 [employer]  

## 2015-08-12 NOTE — Progress Notes (Signed)
Patient ID: Ryan English, male   DOB: March 27, 1951, 65 y.o.   MRN: [id number]       [name] Male    DOB: 10-06-1950   65 y.o.   MRN: [id number] Visit Date: 08/12/2015  Today's Provider: Mar Daring, PA-C   Chief Complaint  Patient presents with  . URI    X 2 days.    Subjective:    URI  This is a new problem. The current episode started in the past 7 days. The problem has been gradually worsening. There has been no fever. Associated symptoms include congestion, coughing, headaches, rhinorrhea, sinus pain, sneezing and a sore throat. Pertinent negatives include no abdominal pain, chest pain, ear pain, nausea, vomiting or wheezing. He has tried decongestant for the symptoms. The treatment provided mild relief.  Patient reports that he has been using Mucinex with no relief. Patient also mentions that he is having surgery in 2 weeks on his back and wants to be well before his operation.      No Known Allergies Previous Medications   ASPIRIN 81 MG TABLET    Take 81 mg by mouth daily.   ATORVASTATIN (LIPITOR) 40 MG TABLET    Take 1 tablet by mouth daily.   CYCLOBENZAPRINE (FLEXERIL) 10 MG TABLET    Take 10 mg by mouth 3 (three) times daily as needed for muscle spasms.   GABAPENTIN (NEURONTIN) 300 MG CAPSULE       LORATADINE (CLARITIN) 10 MG TABLET    Take by mouth.   MELOXICAM (MOBIC) 15 MG TABLET    Take by mouth.   MISC NATURAL PRODUCTS (OSTEO BI-FLEX JOINT SHIELD PO)    Take 2 tablets by mouth daily.   MONTELUKAST (SINGULAIR) 10 MG TABLET    TAKE 1 TABLET (10 MG TOTAL) BY MOUTH AT BEDTIME.   OMEGA-3 FATTY ACIDS (FISH OIL) 1200 MG CAPS    Take 1 capsule by mouth daily.   OMEPRAZOLE (PRILOSEC) 20 MG CAPSULE    Take 20 mg by mouth daily.   PHENYLEPHRINE-DM-GG-APAP (SUDAFED PE COLD/COUGH PO)    Take 10 mg by mouth daily.    Review of Systems  Constitutional: Positive for fatigue.  HENT: Positive for congestion, postnasal drip, rhinorrhea, sneezing and sore  throat. Negative for ear pain.   Respiratory: Positive for cough. Negative for chest tightness, shortness of breath and wheezing.   Cardiovascular: Negative for chest pain.  Gastrointestinal: Negative for nausea, vomiting and abdominal pain.  Neurological: Positive for headaches. Negative for dizziness.    Social History  Substance Use Topics  . Smoking status: Never Smoker   . Smokeless tobacco: Never Used  . Alcohol Use: Yes   Objective:   BP 128/72 mmHg  Pulse 70  Temp(Src) 98.3 F (36.8 C)  Resp 16  Wt 218 lb (98.884 kg)  SpO2 97%  Physical Exam  Constitutional: He appears well-developed and well-nourished. No distress.  HENT:  Head: Normocephalic and atraumatic.  Right Ear: Hearing, tympanic membrane, external ear and ear canal normal. Tympanic membrane is not erythematous and not bulging. No middle ear effusion.  Left Ear: Hearing, tympanic membrane, external ear and ear canal normal. Tympanic membrane is not erythematous and not bulging.  No middle ear effusion.  Nose: Mucosal edema and rhinorrhea present. Right sinus exhibits no maxillary sinus tenderness and no frontal sinus tenderness. Left sinus exhibits no maxillary sinus tenderness and no frontal sinus tenderness.  Mouth/Throat: Uvula is midline, oropharynx is clear and moist and  mucous membranes are normal. No oropharyngeal exudate, posterior oropharyngeal edema or posterior oropharyngeal erythema.  Eyes: Conjunctivae and EOM are normal. Pupils are equal, round, and reactive to light. Right eye exhibits no discharge. Left eye exhibits no discharge.  Neck: Normal range of motion. Neck supple. No tracheal deviation present. No Brudzinski's sign and no Kernig's sign noted. No thyromegaly present.  Cardiovascular: Normal rate, regular rhythm and normal heart sounds.  Exam reveals no gallop and no friction rub.   No murmur heard. Pulmonary/Chest: Effort normal and breath sounds normal. No stridor. No respiratory distress.  He has no wheezes. He has no rales.  Lymphadenopathy:    He has no cervical adenopathy.  Skin: Skin is warm and dry. He is not diaphoretic.  Vitals reviewed.       Assessment & Plan:     1. Upper respiratory infection New-onset symptoms. Explained that this could be most likely viral and that if so it may have to run its course but will give Z-Pak as below in case it is bacterial to catch it and time and hopefully limit symptoms prior to his surgical procedure that is upcoming in 2 weeks. I did advise that he may continue the Mucinex DM as needed for congestion. He does need to make sure to stay well-hydrated and try to get plenty of rest. He is to call the office if his symptoms do worsen in the meantime. - azithromycin (ZITHROMAX) 250 MG tablet; Take 2 tablets PO on day one, and one tablet PO daily thereafter until completed.  Dispense: 6 tablet; Refill: 0  2. Foreskin inflammation He mentions an increase in irritation and inflammation of his foreskin secondary to sexual intercourse. I did advise the may be due to lack of lubrication and to avoid future irritation to add more lubrication. He did voice understanding. I will give bacitracin ointment as below. He is to call the office if symptoms do not improve. - bacitracin ophthalmic ointment; Apply as directed twice daily  Dispense: 3.5 g; Refill: 0       Mar Daring, PA-C  [facility]

## 2015-08-20 ENCOUNTER — Ambulatory Visit (INDEPENDENT_AMBULATORY_CARE_PROVIDER_SITE_OTHER): Payer: BLUE CROSS/BLUE SHIELD | Admitting: Family Medicine

## 2015-08-20 ENCOUNTER — Encounter: Payer: Self-pay | Admitting: Family Medicine

## 2015-08-20 VITALS — BP 128/70 | HR 64 | Temp 97.4°F | Resp 16 | Wt 219.0 lb

## 2015-08-20 DIAGNOSIS — K529 Noninfective gastroenteritis and colitis, unspecified: Secondary | ICD-10-CM

## 2015-08-20 DIAGNOSIS — N4829 Other inflammatory disorders of penis: Secondary | ICD-10-CM | POA: Diagnosis not present

## 2015-08-20 MED ORDER — NYSTATIN 100000 UNIT/GM EX OINT: 1.0000 "application " | TOPICAL_OINTMENT | Freq: Two times a day (BID) | CUTANEOUS | Status: DC

## 2015-08-20 MED ORDER — PROBIOTIC COLON SUPPORT PO CAPS: 1.0000 | ORAL_CAPSULE | Freq: Every day | ORAL | Status: DC

## 2015-08-20 NOTE — Progress Notes (Signed)
Subjective:    Patient ID: Ryan English, male    DOB: [birth year], 65 y.o.   MRN: [id number]  Abdominal Pain This is a new problem. The current episode started in the past 7 days (since Monday pm). The problem has been unchanged. The pain is located in the generalized abdominal region. The pain is at a severity of 3/10. The pain is mild. The quality of the pain is aching ("churning"). Associated symptoms include arthralgias, diarrhea, a fever (temperture reached "almost 100 degrees" last night), flatus, melena, myalgias, nausea and vomiting. Pertinent negatives include no anorexia, belching, constipation, dysuria, frequency, headaches, hematochezia or hematuria. Nothing aggravates the pain. He has tried nothing for the symptoms.  Pt has eaten today, without vomiting. Pt does report he had diarrhea an hour before appointment.      Review of Systems  Constitutional: Positive for fever (temperture reached "almost 100 degrees" last night).  Gastrointestinal: Positive for nausea, vomiting, abdominal pain, diarrhea, melena and flatus. Negative for constipation, hematochezia and anorexia.  Genitourinary: Negative for dysuria, frequency and hematuria.  Musculoskeletal: Positive for myalgias and arthralgias.  Neurological: Positive for light-headedness. Negative for headaches.   BP 128/70 mmHg  Pulse 64  Temp(Src) 97.4 F (36.3 C) (Oral)  Resp 16  Wt 219 lb (99.338 kg)   Patient Active Problem List   Diagnosis Date Noted  . Abnormal blood sugar 05/26/2015  . Adrenal adenoma 05/26/2015  . Benign paroxysmal positional nystagmus 05/26/2015  . Atherosclerosis of coronary artery 05/26/2015  . Biliary calculi 05/26/2015  . Dysfunction of eustachian tube 05/26/2015  . Hemorrhoid 05/26/2015  . 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 Hypercholesterolemia 05/26/2015  . Lung nodule, multiple 05/26/2015  . Atypical chest pain 05/26/2015  . Allergic rhinitis 04/09/2015  . Bradycardia 08/02/2014  .  Combined fat and carbohydrate induced hyperlipemia 08/02/2014  . Gallstone 03/14/2013   Past Medical History  Diagnosis Date  . GERD (gastroesophageal reflux disease)   . Arthritis   . Hyperlipidemia    Current Outpatient Prescriptions on File Prior to Visit  Medication Sig  . aspirin 81 MG tablet Take 81 mg by mouth daily.  Marland Kitchen atorvastatin (LIPITOR) 40 MG tablet Take 1 tablet by mouth daily.  . cyclobenzaprine (FLEXERIL) 10 MG tablet Take 10 mg by mouth 3 (three) times daily as needed for muscle spasms.  Marland Kitchen loratadine (CLARITIN) 10 MG tablet Take by mouth.  . Misc Natural Products (OSTEO BI-FLEX JOINT SHIELD PO) Take 2 tablets by mouth daily.  . montelukast (SINGULAIR) 10 MG tablet TAKE 1 TABLET (10 MG TOTAL) BY MOUTH AT BEDTIME.  Marland Kitchen omeprazole (PRILOSEC) 20 MG capsule Take 20 mg by mouth daily.  Marland Kitchen gabapentin (NEURONTIN) 300 MG capsule Reported on 08/20/2015  . Omega-3 Fatty Acids (FISH OIL) 1200 MG CAPS Take 1 capsule by mouth daily. Reported on 08/20/2015  . Phenylephrine-DM-GG-APAP (SUDAFED PE COLD/COUGH PO) Take 10 mg by mouth daily. Reported on 08/20/2015   No current facility-administered medications on file prior to visit.   No Known Allergies Past Surgical History  Procedure Laterality Date  . Appendectomy  1999   Social History   Social History  . Marital Status: Married    Spouse Name: N/A  . Number of Children: N/A  . Years of Education: N/A   Occupational History  . Not on file.   Social History Main Topics  . Smoking status: Never Smoker   . Smokeless tobacco: Never Used  . Alcohol Use: Yes     Comment: occasionally  .  Drug Use: No  . Sexual Activity: Not on file   Other Topics Concern  . Not on file   Social History Narrative   No family history on file.       Objective:   Physical Exam  Constitutional: He appears well-developed and well-nourished.  Cardiovascular: Normal rate and regular rhythm.   Pulmonary/Chest: Effort normal and breath sounds  normal. No respiratory distress.  Abdominal: Soft. Bowel sounds are increased. There is no tenderness.  Psychiatric: He has a normal mood and affect. His behavior is normal.   BP 128/70 mmHg  Pulse 64  Temp(Src) 97.4 F (36.3 C) (Oral)  Resp 16  Wt 219 lb (99.338 kg)     Assessment & Plan:  1. Gastroenteritis New problem.   Suspect viral. Grandchild was sick with similar. Does have surgery scheduled in three weeks. Warnings given for reasons to call back if worsens or does not improve.   - Probiotic Product (PROBIOTIC COLON SUPPORT) CAPS; Take 1 tablet by mouth daily.  Dispense: 1 capsule; Refill: 0  2. Foreskin inflammation Will try Nystatin. Recheck if does not improve.  - nystatin ointment (MYCOSTATIN); Apply 1 application topically 2 (two) times daily.  Dispense: 30 g; Refill: 0   Patient was seen and examined by Jerrell Belfast, MD, and note scribed by Renaldo Fiddler, CMA. I have reviewed the document for accuracy and completeness and I agree with above. Jerrell Belfast, MD   Margarita Rana, MD

## 2015-11-05 DIAGNOSIS — Z6830 Body mass index (BMI) 30.0-30.9, adult: Secondary | ICD-10-CM | POA: Diagnosis not present

## 2015-11-05 DIAGNOSIS — M4806 Spinal stenosis, lumbar region: Secondary | ICD-10-CM | POA: Diagnosis not present

## 2015-11-06 ENCOUNTER — Ambulatory Visit (INDEPENDENT_AMBULATORY_CARE_PROVIDER_SITE_OTHER): Payer: BLUE CROSS/BLUE SHIELD | Admitting: Family Medicine

## 2015-11-06 ENCOUNTER — Encounter: Payer: Self-pay | Admitting: Family Medicine

## 2015-11-06 VITALS — BP 118/72 | HR 64 | Temp 98.6°F | Resp 16 | Wt 216.0 lb

## 2015-11-06 DIAGNOSIS — M4806 Spinal stenosis, lumbar region: Secondary | ICD-10-CM | POA: Diagnosis not present

## 2015-11-06 DIAGNOSIS — M4310 Spondylolisthesis, site unspecified: Secondary | ICD-10-CM | POA: Insufficient documentation

## 2015-11-06 DIAGNOSIS — J069 Acute upper respiratory infection, unspecified: Secondary | ICD-10-CM | POA: Diagnosis not present

## 2015-11-06 DIAGNOSIS — M479 Spondylosis, unspecified: Secondary | ICD-10-CM | POA: Insufficient documentation

## 2015-11-06 DIAGNOSIS — Z125 Encounter for screening for malignant neoplasm of prostate: Secondary | ICD-10-CM | POA: Diagnosis not present

## 2015-11-06 DIAGNOSIS — Z0000 Encounter for general adult medical examination without abnormal findings: Secondary | ICD-10-CM

## 2015-11-06 DIAGNOSIS — R7309 Other abnormal glucose: Secondary | ICD-10-CM

## 2015-11-06 DIAGNOSIS — M48061 Spinal stenosis, lumbar region without neurogenic claudication: Secondary | ICD-10-CM

## 2015-11-06 DIAGNOSIS — J3089 Other allergic rhinitis: Secondary | ICD-10-CM

## 2015-11-06 DIAGNOSIS — E7800 Pure hypercholesterolemia, unspecified: Secondary | ICD-10-CM | POA: Diagnosis not present

## 2015-11-06 DIAGNOSIS — M5430 Sciatica, unspecified side: Secondary | ICD-10-CM | POA: Insufficient documentation

## 2015-11-06 MED ORDER — HYDROCODONE-HOMATROPINE 5-1.5 MG/5ML PO SYRP: 5.0000 mL | ORAL_SOLUTION | Freq: Three times a day (TID) | ORAL | Status: DC | PRN

## 2015-11-06 NOTE — Progress Notes (Signed)
Patient ID: Ryan English, male   DOB: 1950/07/18, 65 y.o.   MRN: [id number]        [name], Male    DOB: 06-23-1951, 65 y.o.   MRN: [id number] Visit Date: 11/06/2015  Today's Provider: Margarita Rana, MD   Chief Complaint  Patient presents with  . Annual Exam   Subjective:    Annual physical exam Ryan English is a 65 y.o. male who presents today for health maintenance and complete physical. He feels fairly well.  Pt reports having cold symptoms for about three days.  He is feeling some better but would like a cough medicine to help with sleep.   He reports exercising regularly. He reports he is sleeping fairly well.  -----------------------------------------------------------------   Review of Systems  Constitutional: Negative.   HENT: Negative.   Eyes: Negative.   Respiratory: Positive for cough. Negative for apnea, choking, chest tightness, shortness of breath, wheezing and stridor.   Cardiovascular: Negative.   Gastrointestinal: Negative.   Endocrine: Negative.   Genitourinary: Negative.   Musculoskeletal: Positive for back pain. Negative for myalgias, joint swelling, arthralgias, gait problem, neck pain and neck stiffness.  Skin: Negative.   Allergic/Immunologic: Negative.   Neurological: Negative.   Hematological: Negative.   Psychiatric/Behavioral: Negative.     Social History      He  reports that he has never smoked. He has never used smokeless tobacco. He reports that he drinks alcohol. He reports that he does not use illicit drugs.       Social History   Social History  . Marital Status: Married    Spouse Name: N/A  . Number of Children: 2  . Years of Education: N/A   Social History Main Topics  . Smoking status: Never Smoker   . Smokeless tobacco: Never Used  . Alcohol Use: Yes     Comment: occasionally  . Drug Use: No  . Sexual Activity: Not Asked   Other Topics Concern  . None   Social History Narrative    Past  Medical History  Diagnosis Date  . GERD (gastroesophageal reflux disease)   . Arthritis   . Hyperlipidemia      Patient Active Problem List   Diagnosis Date Noted  . Degenerative arthritis of spine 11/06/2015  . Neuralgia neuritis, sciatic nerve 11/06/2015  . Lumbar canal stenosis 11/06/2015  . SPL (spondylolisthesis) 11/06/2015  . Abnormal blood sugar 05/26/2015  . Adrenal adenoma 05/26/2015  . Benign paroxysmal positional nystagmus 05/26/2015  . Atherosclerosis of coronary artery 05/26/2015  . Biliary calculi 05/26/2015  . Dysfunction of eustachian tube 05/26/2015  . Hemorrhoid 05/26/2015  . 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 Hypercholesterolemia 05/26/2015  . Lung nodule, multiple 05/26/2015  . Atypical chest pain 05/26/2015  . Allergic rhinitis 04/09/2015  . Bradycardia 08/02/2014  . Combined fat and carbohydrate induced hyperlipemia 08/02/2014  . Gallstone 03/14/2013    Past Surgical History  Procedure Laterality Date  . Appendectomy  1999    Family History        Family Status  Relation Status Death Age  . Mother Deceased 44  . Father Deceased 47's    MVA  . Sister Deceased 78    DM  . Brother Alive   . Paternal Grandmother Alive   . Sister Alive   . Brother Deceased 13    MI  . Brother Alive   . Brother Alive         His family history includes Cancer  in his mother; Diabetes in his brother, brother, sister, and sister; Heart disease in his brother and brother.    No Known Allergies  Previous Medications   ASPIRIN 81 MG TABLET    Take 81 mg by mouth daily.   ATORVASTATIN (LIPITOR) 40 MG TABLET    Take 1 tablet by mouth daily.   CYCLOBENZAPRINE (FLEXERIL) 10 MG TABLET    Take 10 mg by mouth 3 (three) times daily as needed for muscle spasms.   GABAPENTIN (NEURONTIN) 300 MG CAPSULE    Reported on 08/20/2015   LORATADINE (CLARITIN) 10 MG TABLET    Take by mouth.   MELOXICAM (MOBIC) 15 MG TABLET    Take 15 mg by mouth.   MISC NATURAL PRODUCTS (OSTEO  BI-FLEX JOINT SHIELD PO)    Take 2 tablets by mouth daily.   MONTELUKAST (SINGULAIR) 10 MG TABLET    TAKE 1 TABLET (10 MG TOTAL) BY MOUTH AT BEDTIME.   NYSTATIN OINTMENT (MYCOSTATIN)    Apply 1 application topically 2 (two) times daily.   OMEGA-3 FATTY ACIDS (FISH OIL) 1200 MG CAPS    Take 1 capsule by mouth daily. Reported on 08/20/2015   OMEPRAZOLE (PRILOSEC) 20 MG CAPSULE    Take 20 mg by mouth daily.   PHENYLEPHRINE-DM-GG-APAP (SUDAFED PE COLD/COUGH PO)    Take 10 mg by mouth daily. Reported on 08/20/2015    Patient Care Team: Margarita Rana, MD as PCP - General (Family Medicine) Robert Bellow, MD as Consulting Physician (General Surgery) Margarita Rana, MD as Referring Physician (Family Medicine)     Objective:   Vitals: BP 118/72 mmHg  Pulse 64  Temp(Src) 98.6 F (37 C) (Oral)  Resp 16  Wt 216 lb (97.977 kg)   Physical Exam  Constitutional: He is oriented to person, place, and time. He appears well-developed and well-nourished.  HENT:  Head: Normocephalic and atraumatic.  Right Ear: Tympanic membrane, external ear and ear canal normal.  Left Ear: Tympanic membrane, external ear and ear canal normal.  Nose: Mucosal edema present.  Mouth/Throat: Uvula is midline, oropharynx is clear and moist and mucous membranes are normal.  Cardiovascular: Normal rate, regular rhythm and normal heart sounds.   Pulmonary/Chest: Effort normal and breath sounds normal.  Musculoskeletal: Normal range of motion.  Neurological: He is alert and oriented to person, place, and time.  Skin: Skin is warm and dry.  Psychiatric: He has a normal mood and affect. His behavior is normal. Judgment and thought content normal.     Depression Screen PHQ 2/9 Scores 11/06/2015  PHQ - 2 Score 0      Assessment & Plan:     Routine Health Maintenance and Physical Exam  Exercise Activities and Dietary recommendations Goals    None      Immunization History  Administered Date(s) Administered  .  Influenza,inj,Quad PF,36+ Mos 04/05/2015  . Td 05/17/2007  . Tdap 05/17/2007  . Zoster 08/21/2012    Health Maintenance  Topic Date Due  . HIV Screening  09/15/1965  . PNA vac Low Risk Adult (1 of 2 - PCV13) 09/16/2015  . INFLUENZA VACCINE  02/03/2016  . TETANUS/TDAP  05/22/2017  . COLONOSCOPY  09/08/2020  . ZOSTAVAX  Completed  . Hepatitis C Screening  Completed    2. Hypercholesterolemia Stable; will check labs.  - Comprehensive metabolic panel - Lipid panel  3. Abnormal blood sugar Stable; check labs.   - Comprehensive metabolic panel - TSH - Hemoglobin A1c  4. Prostate cancer screening  Will check labs.   - PSA  5. Upper respiratory infection Suspect Viral.  Will treat with cough medicine as below.  Pt advised to call if worsening or not improved.   - HYDROcodone-homatropine (HYCODAN) 5-1.5 MG/5ML syrup; Take 5 mLs by mouth every 8 (eight) hours as needed for cough.  Dispense: 120 mL; Refill: 0    Discussed health benefits of physical activity, and encouraged him to engage in regular exercise appropriate for his age and condition.     Patient was seen and examined by Jerrell Belfast, MD, and note scribed by Ashley Royalty, CMA.  I have reviewed the document for accuracy and completeness and I agree with above. Jerrell Belfast, MD   Margarita Rana, MD   --------------------------------------------------------------------

## 2015-11-14 DIAGNOSIS — Z0000 Encounter for general adult medical examination without abnormal findings: Secondary | ICD-10-CM | POA: Diagnosis not present

## 2015-11-14 DIAGNOSIS — E7800 Pure hypercholesterolemia, unspecified: Secondary | ICD-10-CM | POA: Diagnosis not present

## 2015-11-14 DIAGNOSIS — Z125 Encounter for screening for malignant neoplasm of prostate: Secondary | ICD-10-CM | POA: Diagnosis not present

## 2015-11-14 DIAGNOSIS — R7309 Other abnormal glucose: Secondary | ICD-10-CM | POA: Diagnosis not present

## 2015-11-15 LAB — COMPREHENSIVE METABOLIC PANEL
ALK PHOS: 57 IU/L (ref 39–117)
ALT: 23 IU/L (ref 0–44)
AST: 20 IU/L (ref 0–40)
Albumin/Globulin Ratio: 1.7 (ref 1.2–2.2)
Albumin: 4.4 g/dL (ref 3.6–4.8)
BILIRUBIN TOTAL: 0.5 mg/dL (ref 0.0–1.2)
BUN/Creatinine Ratio: 14 (ref 10–24)
BUN: 13 mg/dL (ref 8–27)
CHLORIDE: 103 mmol/L (ref 96–106)
CO2: 25 mmol/L (ref 18–29)
CREATININE: 0.93 mg/dL (ref 0.76–1.27)
Calcium: 9.6 mg/dL (ref 8.6–10.2)
GFR calc Af Amer: 99 mL/min/{1.73_m2} (ref >59)
GFR calc non Af Amer: 86 mL/min/{1.73_m2} (ref >59)
GLUCOSE: 96 mg/dL (ref 65–99)
Globulin, Total: 2.6 g/dL (ref 1.5–4.5)
Potassium: 4.4 mmol/L (ref 3.5–5.2)
Sodium: 143 mmol/L (ref 134–144)
Total Protein: 7 g/dL (ref 6.0–8.5)

## 2015-11-15 LAB — HEMOGLOBIN A1C
ESTIMATED AVERAGE GLUCOSE: 108 mg/dL
Hgb A1c MFr Bld: 5.4 % (ref 4.8–5.6)

## 2015-11-15 LAB — CBC WITH DIFFERENTIAL/PLATELET
BASOS ABS: 0.1 10*3/uL (ref 0.0–0.2)
Basos: 1 %
EOS (ABSOLUTE): 0.1 10*3/uL (ref 0.0–0.4)
Eos: 2 %
Hematocrit: 42.8 % (ref 37.5–51.0)
Hemoglobin: 14.8 g/dL (ref 12.6–17.7)
Immature Grans (Abs): 0.1 10*3/uL (ref 0.0–0.1)
Immature Granulocytes: 1 %
LYMPHS ABS: 1.8 10*3/uL (ref 0.7–3.1)
Lymphs: 26 %
MCH: 29.7 pg (ref 26.6–33.0)
MCHC: 34.6 g/dL (ref 31.5–35.7)
MCV: 86 fL (ref 79–97)
MONOS ABS: 0.7 10*3/uL (ref 0.1–0.9)
Monocytes: 10 %
Neutrophils Absolute: 4.2 10*3/uL (ref 1.4–7.0)
Neutrophils: 60 %
Platelets: 149 10*3/uL — ABNORMAL LOW (ref 150–379)
RBC: 4.98 x10E6/uL (ref 4.14–5.80)
RDW: 13.1 % (ref 12.3–15.4)
WBC: 6.9 10*3/uL (ref 3.4–10.8)

## 2015-11-15 LAB — PSA: Prostate Specific Ag, Serum: 1 ng/mL (ref 0.0–4.0)

## 2015-11-15 LAB — LIPID PANEL
Chol/HDL Ratio: 3.2 ratio units (ref 0.0–5.0)
Cholesterol, Total: 122 mg/dL (ref 100–199)
HDL: 38 mg/dL — ABNORMAL LOW (ref >39)
LDL CALC: 69 mg/dL (ref 0–99)
TRIGLYCERIDES: 74 mg/dL (ref 0–149)
VLDL Cholesterol Cal: 15 mg/dL (ref 5–40)

## 2015-11-15 LAB — TSH: TSH: 2.67 u[IU]/mL (ref 0.450–4.500)

## 2015-11-17 ENCOUNTER — Telehealth: Payer: Self-pay

## 2015-11-17 NOTE — Telephone Encounter (Signed)
-----   Message from Margarita Rana, MD sent at 11/16/2015  6:57 AM EDT ----- Labs stable. Please notify patient. Thanks.

## 2015-11-17 NOTE — Telephone Encounter (Signed)
LMTCB 11/17/2015  Thanks,   -Mickel Baas

## 2015-11-17 NOTE — Telephone Encounter (Signed)
Advised patient of lab results  

## 2016-01-26 ENCOUNTER — Other Ambulatory Visit: Payer: Self-pay | Admitting: Physician Assistant

## 2016-01-26 DIAGNOSIS — J3089 Other allergic rhinitis: Secondary | ICD-10-CM

## 2016-05-18 ENCOUNTER — Ambulatory Visit (INDEPENDENT_AMBULATORY_CARE_PROVIDER_SITE_OTHER): Payer: BLUE CROSS/BLUE SHIELD | Admitting: Physician Assistant

## 2016-05-18 ENCOUNTER — Ambulatory Visit
Admission: RE | Admit: 2016-05-18 | Discharge: 2016-05-18 | Disposition: A | Discharge status: Home / Self Care | Payer: BLUE CROSS/BLUE SHIELD | Source: Ambulatory Visit | Attending: Physician Assistant | Admitting: Physician Assistant

## 2016-05-18 ENCOUNTER — Encounter: Payer: Self-pay | Admitting: Physician Assistant

## 2016-05-18 ENCOUNTER — Telehealth: Payer: Self-pay

## 2016-05-18 VITALS — BP 136/86 | HR 86 | Temp 98.5°F | Resp 16 | Wt 210.0 lb

## 2016-05-18 DIAGNOSIS — R05 Cough: Secondary | ICD-10-CM | POA: Insufficient documentation

## 2016-05-18 DIAGNOSIS — R058 Other specified cough: Secondary | ICD-10-CM

## 2016-05-18 DIAGNOSIS — J069 Acute upper respiratory infection, unspecified: Secondary | ICD-10-CM

## 2016-05-18 MED ORDER — AZITHROMYCIN 250 MG PO TABS: ORAL_TABLET | ORAL | 0 refills | Status: DC

## 2016-05-18 NOTE — Patient Instructions (Signed)
Upper Respiratory Infection, Adult Most upper respiratory infections (URIs) are caused by a virus. A URI affects the nose, throat, and upper air passages. The most common type of URI is often called "the common cold." Follow these instructions at home:  Take medicines only as told by your doctor.  Gargle warm saltwater or take cough drops to comfort your throat as told by your doctor.  Use a warm mist humidifier or inhale steam from a shower to increase air moisture. This may make it easier to breathe.  Drink enough fluid to keep your pee (urine) clear or pale yellow.  Eat soups and other clear broths.  Have a healthy diet.  Rest as needed.  Go back to work when your fever is gone or your doctor says it is okay.  You may need to stay home longer to avoid giving your URI to others.  You can also wear a face mask and wash your hands often to prevent spread of the virus.  Use your inhaler more if you have asthma.  Do not use any tobacco products, including cigarettes, chewing tobacco, or electronic cigarettes. If you need help quitting, ask your doctor. Contact a doctor if:  You are getting worse, not better.  Your symptoms are not helped by medicine.  You have chills.  You are getting more short of breath.  You have brown or red mucus.  You have yellow or brown discharge from your nose.  You have pain in your face, especially when you bend forward.  You have a fever.  You have puffy (swollen) neck glands.  You have pain while swallowing.  You have white areas in the back of your throat. Get help right away if:  You have very bad or constant:  Headache.  Ear pain.  Pain in your forehead, behind your eyes, and over your cheekbones (sinus pain).  Chest pain.  You have long-lasting (chronic) lung disease and any of the following:  Wheezing.  Long-lasting cough.  Coughing up blood.  A change in your usual mucus.  You have a stiff neck.  You have  changes in your:  Vision.  Hearing.  Thinking.  Mood. This information is not intended to replace advice given to you by your health care provider. Make sure you discuss any questions you have with your health care provider. Document Released: 12/08/2007 Document Revised: 02/22/2016 Document Reviewed: 09/26/2013 Elsevier Interactive Patient Education  2017 Elsevier Inc.  

## 2016-05-18 NOTE — Telephone Encounter (Signed)
LMTCB 05/18/2016  Thanks,   -Mickel Baas

## 2016-05-18 NOTE — Telephone Encounter (Signed)
-----   Message from Trinna Post, [place] sent at 05/18/2016  3:20 PM EST ----- Normal CXR. No evidence of pneumonia. Likely viral URI, though patient may keep hard script for azithromycin.

## 2016-05-18 NOTE — Progress Notes (Signed)
[place]  Chief Complaint  Patient presents with  . URI    Subjective:    Patient ID: Ryan English, male    DOB: 01/13/1951, 65 y.o.   MRN: [id number]  Upper Respiratory Infection: Link Ryan English is a 65 y.o. male with a past medical history significant for Allergic Rhinitis,complaining of symptoms of a URI. Symptoms include congestion, cough and sore throat. Onset of symptoms was 3 days ago, gradually worsening since that time. He also c/o cough described as productive and productive cough with  green colored sputum for the past 3 days .  He is drinking plenty of fluids. Evaluation to date: none. Treatment to date: cough suppressants. The treatment has provided minimal.   Review of Systems  Constitutional: Positive for chills and fatigue. Negative for activity change, appetite change, diaphoresis, fever and unexpected weight change.  HENT: Positive for congestion, sneezing and sore throat. Negative for ear discharge, ear pain, hearing loss, nosebleeds, postnasal drip, rhinorrhea, sinus pain, sinus pressure, tinnitus, trouble swallowing and voice change.   Eyes: Negative.   Respiratory: Positive for cough, shortness of breath and wheezing. Negative for apnea, choking, chest tightness and stridor.   Gastrointestinal: Negative.   Neurological: Positive for light-headedness. Negative for dizziness and headaches.       Objective:   BP 136/86 (BP Location: Left Arm, Patient Position: Sitting, Cuff Size: Large)   Pulse 86   Temp 98.5 F (36.9 C) (Oral)   Resp 16   Wt 210 lb (95.3 kg)   BMI 29.29 kg/m   Patient Active Problem List   Diagnosis Date Noted  . Degenerative arthritis of spine 11/06/2015  . Neuralgia neuritis, sciatic nerve 11/06/2015  . Lumbar canal stenosis 11/06/2015  . SPL (spondylolisthesis) 11/06/2015  . Abnormal blood sugar 05/26/2015  . Adrenal adenoma 05/26/2015  . Benign paroxysmal positional nystagmus  05/26/2015  . Atherosclerosis of coronary artery 05/26/2015  . Biliary calculi 05/26/2015  . Dysfunction of eustachian tube 05/26/2015  . Hemorrhoid 05/26/2015  . 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 Hypercholesterolemia 05/26/2015  . Lung nodule, multiple 05/26/2015  . Atypical chest pain 05/26/2015  . Allergic rhinitis 04/09/2015  . Bradycardia 08/02/2014  . Combined fat and carbohydrate induced hyperlipemia 08/02/2014  . Gallstone 03/14/2013    Outpatient Encounter Prescriptions as of 05/18/2016  Medication Sig Note  . aspirin 81 MG tablet Take 81 mg by mouth daily.   Marland Kitchen atorvastatin (LIPITOR) 40 MG tablet Take 1 tablet by mouth daily.   . meloxicam (MOBIC) 15 MG tablet Take 15 mg by mouth. 11/06/2015: Received from: [facility]  . Misc Natural Products (OSTEO BI-FLEX JOINT SHIELD PO) Take 2 tablets by mouth daily.   . montelukast (SINGULAIR) 10 MG tablet TAKE 1 TABLET (10 MG TOTAL) BY MOUTH AT BEDTIME.   Marland Kitchen Omega-3 Fatty Acids (FISH OIL) 1200 MG CAPS Take 1 capsule by mouth daily. Reported on 08/20/2015   . omeprazole (PRILOSEC) 20 MG capsule Take 20 mg by mouth daily.   Marland Kitchen azithromycin (ZITHROMAX) 250 MG tablet Take 2 the first day, one the following four days   . cyclobenzaprine (FLEXERIL) 10 MG tablet Take 10 mg by mouth 3 (three) times daily as needed for muscle spasms.   Marland Kitchen gabapentin (NEURONTIN) 300 MG capsule Reported on 08/20/2015 05/26/2015: Received from: External Pharmacy  . HYDROcodone-homatropine (HYCODAN) 5-1.5 MG/5ML syrup Take 5 mLs by mouth every 8 (eight) hours as needed for cough. (Patient not taking: Reported on 05/18/2016)   .  loratadine (CLARITIN) 10 MG tablet Take by mouth. 05/26/2015: Received from: [facility]  . nystatin ointment (MYCOSTATIN) Apply 1 application topically 2 (two) times daily.   Marland Kitchen Phenylephrine-DM-GG-APAP (SUDAFED PE COLD/COUGH PO) Take 10 mg by mouth daily. Reported on 08/20/2015    No facility-administered encounter medications  on file as of 05/18/2016.     No Known Allergies     Physical Exam  Constitutional: He is oriented to person, place, and time. He appears well-developed and well-nourished. No distress.  HENT:  Mouth/Throat: Oropharynx is clear and moist. No oropharyngeal exudate.  Eyes: Conjunctivae are normal.  Neck: Neck supple.  Cardiovascular: Normal rate, regular rhythm and normal heart sounds.   Pulmonary/Chest: Effort normal and breath sounds normal.  Lymphadenopathy:    He has no cervical adenopathy.  Neurological: He is alert and oriented to person, place, and time.  Skin: Skin is warm and dry. He is not diaphoretic.  Psychiatric: He has a normal mood and affect. His behavior is normal.  Vitals reviewed.      Assessment & Plan:   Problem List Items Addressed This Visit    None    Visit Diagnoses    Productive cough    -  Primary   Relevant Medications   azithromycin (ZITHROMAX) 250 MG tablet   Other Relevant Orders   DG Chest 2 View   Upper respiratory tract infection, unspecified type       Relevant Medications   azithromycin (ZITHROMAX) 250 MG tablet     Problem List Items Addressed This Visit    None    Visit Diagnoses    Productive cough    -  Primary   Relevant Medications   azithromycin (ZITHROMAX) 250 MG tablet   Other Relevant Orders   DG Chest 2 View   Upper respiratory tract infection, unspecified type       Relevant Medications   azithromycin (ZITHROMAX) 250 MG tablet      Patient is 65 y/o male presenting with Uri symptoms and productive cough. Rapid flu in office was negative. Patient traveling for work tomorrow. Will get CXR to further evaluate. Have given hard script for azithromycin if symptoms progress while on his trip. Counseled him on symptoms that would warrant urgent care visit. Free to call office with questions/concerns.  Recommend rest, fluids, frequent hand washing.   Return if symptoms worsen or fail to improve.  The entirety of the  information documented in the History of Present Illness, Review of Systems and Physical Exam were personally obtained by me. Portions of this information were initially documented by Ashley Royalty, CMA and reviewed by me for thoroughness and accuracy.    Patient Instructions  Upper Respiratory Infection, Adult Most upper respiratory infections (URIs) are caused by a virus. A URI affects the nose, throat, and upper air passages. The most common type of URI is often called "the common cold." Follow these instructions at home:  Take medicines only as told by your doctor.  Gargle warm saltwater or take cough drops to comfort your throat as told by your doctor.  Use a warm mist humidifier or inhale steam from a shower to increase air moisture. This may make it easier to breathe.  Drink enough fluid to keep your pee (urine) clear or pale yellow.  Eat soups and other clear broths.  Have a healthy diet.  Rest as needed.  Go back to work when your fever is gone or your doctor says it is okay.  You  may need to stay home longer to avoid giving your URI to others.  You can also wear a face mask and wash your hands often to prevent spread of the virus.  Use your inhaler more if you have asthma.  Do not use any tobacco products, including cigarettes, chewing tobacco, or electronic cigarettes. If you need help quitting, ask your doctor. Contact a doctor if:  You are getting worse, not better.  Your symptoms are not helped by medicine.  You have chills.  You are getting more short of breath.  You have brown or red mucus.  You have yellow or brown discharge from your nose.  You have pain in your face, especially when you bend forward.  You have a fever.  You have puffy (swollen) neck glands.  You have pain while swallowing.  You have white areas in the back of your throat. Get help right away if:  You have very bad or constant:  Headache.  Ear pain.  Pain in your forehead,  behind your eyes, and over your cheekbones (sinus pain).  Chest pain.  You have long-lasting (chronic) lung disease and any of the following:  Wheezing.  Long-lasting cough.  Coughing up blood.  A change in your usual mucus.  You have a stiff neck.  You have changes in your:  Vision.  Hearing.  Thinking.  Mood. This information is not intended to replace advice given to you by your health care provider. Make sure you discuss any questions you have with your health care provider. Document Released: 12/08/2007 Document Revised: 02/22/2016 Document Reviewed: 09/26/2013 Elsevier Interactive Patient Education  2017 [employer].     The entirety of the information documented in the History of Present Illness, Review of Systems and Physical Exam were personally obtained by me. Portions of this information were initially documented by Ashley Royalty, CMA and reviewed by me for thoroughness and accuracy.

## 2016-05-19 NOTE — Telephone Encounter (Signed)
Pt called for results.  Ryan English was busy.  Since it was normal I gave him the results.  Ryan English can still call pt back if she wants to give him more details.  Pt understood results and will keep the rx for antibiotic on hand but said for right now he will not take it.  Thanks, [employer]

## 2016-05-19 NOTE — Telephone Encounter (Signed)
Thank you. Will advise Mickel Baas.

## 2016-06-09 DIAGNOSIS — Z23 Encounter for immunization: Secondary | ICD-10-CM | POA: Diagnosis not present

## 2016-06-17 ENCOUNTER — Telehealth: Payer: Self-pay | Admitting: Physician Assistant

## 2016-06-17 MED ORDER — CLOTRIMAZOLE-BETAMETHASONE 1-0.05 % EX CREA: 1.0000 "application " | TOPICAL_CREAM | Freq: Two times a day (BID) | CUTANEOUS | 1 refills | Status: DC

## 2016-06-17 NOTE — Telephone Encounter (Signed)
Can we see if it is clomitrazole-betamethasone or nystatin? I see Nystatin in his med list that she prescribed last February.

## 2016-06-17 NOTE — Telephone Encounter (Signed)
Please Review.  Thanks,  -Wilba Mutz 

## 2016-06-17 NOTE — Telephone Encounter (Signed)
Per patient he gave the name to the girl that answered this morning. He is talking about the Clotrimazole cream,.  Thanks,  -Joseline

## 2016-06-17 NOTE — Telephone Encounter (Signed)
Pt needs prescription for cream you put on your genital area  He said Dr. Venia Minks use to prescribe.  Clotrimazole betamethasone dipropioneate cream?  [facility]   Pt's call back  [phone]   [facility]

## 2016-06-17 NOTE — Telephone Encounter (Signed)
Sent in to [facility] [address]

## 2016-06-24 DIAGNOSIS — K219 Gastro-esophageal reflux disease without esophagitis: Secondary | ICD-10-CM | POA: Insufficient documentation

## 2016-06-24 DIAGNOSIS — E782 Mixed hyperlipidemia: Secondary | ICD-10-CM | POA: Diagnosis not present

## 2016-06-24 DIAGNOSIS — I2510 Atherosclerotic heart disease of native coronary artery without angina pectoris: Secondary | ICD-10-CM | POA: Diagnosis not present

## 2016-06-24 DIAGNOSIS — Z9189 Other specified personal risk factors, not elsewhere classified: Secondary | ICD-10-CM | POA: Diagnosis not present

## 2016-07-07 DIAGNOSIS — E782 Mixed hyperlipidemia: Secondary | ICD-10-CM | POA: Diagnosis not present

## 2016-07-24 ENCOUNTER — Other Ambulatory Visit: Payer: Self-pay | Admitting: Physician Assistant

## 2016-07-24 DIAGNOSIS — J3089 Other allergic rhinitis: Secondary | ICD-10-CM

## 2016-08-17 ENCOUNTER — Ambulatory Visit (INDEPENDENT_AMBULATORY_CARE_PROVIDER_SITE_OTHER): Payer: BLUE CROSS/BLUE SHIELD | Admitting: Family Medicine

## 2016-08-17 ENCOUNTER — Encounter: Payer: Self-pay | Admitting: Family Medicine

## 2016-08-17 VITALS — BP 132/72 | HR 72 | Temp 98.8°F | Resp 16 | Wt 212.0 lb

## 2016-08-17 DIAGNOSIS — J069 Acute upper respiratory infection, unspecified: Secondary | ICD-10-CM

## 2016-08-17 DIAGNOSIS — B9789 Other viral agents as the cause of diseases classified elsewhere: Secondary | ICD-10-CM

## 2016-08-17 MED ORDER — HYDROCODONE-HOMATROPINE 5-1.5 MG/5ML PO SYRP: ORAL_SOLUTION | ORAL | 0 refills | Status: DC

## 2016-08-17 NOTE — Patient Instructions (Signed)
Discussed use of Mucinex D for congestion and Delsym for cough. 

## 2016-08-17 NOTE — Progress Notes (Signed)
Subjective:     Patient ID: Ryan English, male   DOB: [birth year], 66 y.o.   MRN: [id number]  HPI  Chief Complaint  Patient presents with  . URI    Patient reports that he has had nasal congestion, chills, PND, and sore throat X 3 days. Patient reports that he has done salt water gargles and Mucinex with no relief. He has also taken some left over cough syrup from his last visit which helped a little.   Reports exposure to sick grandchildren.   Review of Systems     Objective:   Physical Exam  Constitutional: He appears well-developed and well-nourished. No distress.  Ears: T.M's intact without inflammation Throat: no tonsillar enlargement or exudate Neck: no cervical adenopathy Lungs: clear     Assessment:    1. Viral upper respiratory tract infection - HYDROcodone-homatropine (HYCODAN) 5-1.5 MG/5ML syrup; 5 ml 4-6 hours as needed for cough  Dispense: 240 mL; Refill: 0    Plan:    Discussed use of Mucinex D and Delsym.

## 2016-08-20 ENCOUNTER — Telehealth: Payer: Self-pay | Admitting: Physician Assistant

## 2016-08-20 ENCOUNTER — Encounter: Payer: Self-pay | Admitting: Physician Assistant

## 2016-08-20 ENCOUNTER — Ambulatory Visit (INDEPENDENT_AMBULATORY_CARE_PROVIDER_SITE_OTHER): Payer: BLUE CROSS/BLUE SHIELD | Admitting: Physician Assistant

## 2016-08-20 VITALS — BP 114/68 | HR 88 | Temp 98.8°F | Resp 16 | Wt 208.0 lb

## 2016-08-20 DIAGNOSIS — R05 Cough: Secondary | ICD-10-CM | POA: Diagnosis not present

## 2016-08-20 DIAGNOSIS — K529 Noninfective gastroenteritis and colitis, unspecified: Secondary | ICD-10-CM | POA: Diagnosis not present

## 2016-08-20 DIAGNOSIS — R059 Cough, unspecified: Secondary | ICD-10-CM

## 2016-08-20 LAB — POCT INFLUENZA A/B
Influenza A, POC: NEGATIVE
Influenza B, POC: NEGATIVE

## 2016-08-20 MED ORDER — PROMETHAZINE HCL 12.5 MG PO TABS: 12.5000 mg | ORAL_TABLET | Freq: Four times a day (QID) | ORAL | 0 refills | Status: DC | PRN

## 2016-08-20 NOTE — Telephone Encounter (Signed)
Ryan English was prescribed Zofran  Thanks,   -Mickel Baas

## 2016-08-20 NOTE — Telephone Encounter (Signed)
Why is it taking three days? Did explain to patient about prior authorization and chance he might pay out of pocket to get immediately. Don't feel comfortable him taking somebody else's prescription.

## 2016-08-20 NOTE — Telephone Encounter (Signed)
Pt advised... He is going to pick up his prescription.   Thanks,   -Mickel Baas

## 2016-08-20 NOTE — Telephone Encounter (Signed)
Pt was in this am.  He was given a medication for stomach virus, pain ect.  He said the rx wouldn't be avalible for 3 days.  He said his wife was in the first of the year with the same virus.  He wants to know if he can take the rx that she was given.  They have some left over.  His wives name is Hovannes Mcmanigal 01/25/55  His call back is [phone]  Thanks Con [employer]

## 2016-08-20 NOTE — Progress Notes (Signed)
[name] Male    DOB: Mar 26, 1951   66 y.o.   MRN: [id number] Visit Date: 08/20/2016  Today's Provider: Trinna Post, PA-C   Chief Complaint  Patient presents with  . Emesis   Subjective:    Emesis   This is a new problem. The current episode started yesterday. The problem occurs 5 to 10 times per day. The problem has been gradually improving. Associated symptoms include abdominal pain, chills, coughing, diarrhea, a fever, headaches, myalgias and URI. Pertinent negatives include no arthralgias, chest pain, dizziness or sweats. Risk factors include ill contacts. He has tried increased fluids for the symptoms.    No Known Allergies   Current Outpatient Prescriptions:  .  aspirin 81 MG tablet, Take 81 mg by mouth daily., Disp: , Rfl:  .  atorvastatin (LIPITOR) 40 MG tablet, Take 1 tablet by mouth daily., Disp: , Rfl:  .  azithromycin (ZITHROMAX) 250 MG tablet, Take 2 the first day, one the following four days, Disp: 6 tablet, Rfl: 0 .  clotrimazole-betamethasone (LOTRISONE) cream, Apply 1 application topically 2 (two) times daily., Disp: 45 g, Rfl: 1 .  cyclobenzaprine (FLEXERIL) 10 MG tablet, Take 10 mg by mouth 3 (three) times daily as needed for muscle spasms., Disp: , Rfl:  .  gabapentin (NEURONTIN) 300 MG capsule, Reported on 08/20/2015, Disp: , Rfl: 1 .  HYDROcodone-homatropine (HYCODAN) 5-1.5 MG/5ML syrup, 5 ml 4-6 hours as needed for cough, Disp: 240 mL, Rfl: 0 .  loratadine (CLARITIN) 10 MG tablet, Take by mouth., Disp: , Rfl:  .  meloxicam (MOBIC) 15 MG tablet, Take 15 mg by mouth., Disp: , Rfl:  .  Misc Natural Products (OSTEO BI-FLEX JOINT SHIELD PO), Take 2 tablets by mouth daily., Disp: , Rfl:  .  montelukast (SINGULAIR) 10 MG tablet, TAKE 1 TABLET (10 MG TOTAL) BY MOUTH AT BEDTIME., Disp: 90 tablet, Rfl: 1 .  nystatin ointment (MYCOSTATIN), Apply 1 application topically 2 (two) times daily., Disp: 30 g, Rfl: 0 .  Omega-3 Fatty Acids (FISH OIL)  1200 MG CAPS, Take 1 capsule by mouth daily. Reported on 08/20/2015, Disp: , Rfl:  .  omeprazole (PRILOSEC) 20 MG capsule, Take 20 mg by mouth daily., Disp: , Rfl:  .  Phenylephrine-DM-GG-APAP (SUDAFED PE COLD/COUGH PO), Take 10 mg by mouth daily. Reported on 08/20/2015, Disp: , Rfl:  .  promethazine (PHENERGAN) 12.5 MG tablet, Take 1 tablet (12.5 mg total) by mouth every 6 (six) hours as needed for nausea or vomiting., Disp: 30 tablet, Rfl: 0  Review of Systems  Constitutional: Positive for chills, fatigue and fever. Negative for activity change, appetite change, diaphoresis and unexpected weight change.  HENT: Positive for congestion, postnasal drip and rhinorrhea. Negative for ear discharge, ear pain, nosebleeds, sinus pain, sinus pressure, sneezing, sore throat and tinnitus.   Eyes: Negative.   Respiratory: Positive for cough, chest tightness and shortness of breath. Negative for apnea, choking, wheezing and stridor.   Cardiovascular: Negative for chest pain.  Gastrointestinal: Positive for abdominal pain, diarrhea, nausea and vomiting. Negative for abdominal distention, anal bleeding, blood in stool, constipation and rectal pain.  Musculoskeletal: Positive for myalgias. Negative for arthralgias.  Neurological: Positive for headaches. Negative for dizziness and light-headedness.    Social History  Substance Use Topics  . Smoking status: Never Smoker  . Smokeless tobacco: Never Used  . Alcohol use Yes     Comment: occasionally   Objective:   BP 114/68 (BP Location:  Left Arm, Patient Position: Sitting, Cuff Size: Large)   Pulse 88   Temp 98.8 F (37.1 C) (Oral)   Resp 16   Wt 208 lb (94.3 kg)   BMI 29.01 kg/m   Physical Exam  Constitutional: He is oriented to person, place, and time. He appears well-developed and well-nourished.  HENT:  Right Ear: Tympanic membrane and external ear normal.  Left Ear: Tympanic membrane and external ear normal.  Mouth/Throat: Oropharynx is clear  and moist. No oropharyngeal exudate.  Eyes: Conjunctivae are normal.  Neck: Neck supple.  Cardiovascular: Normal rate, regular rhythm and normal heart sounds.   Pulmonary/Chest: Effort normal and breath sounds normal.  Abdominal: Soft. Bowel sounds are normal. He exhibits no distension. There is no tenderness. There is no rebound and no guarding.  Lymphadenopathy:    He has no cervical adenopathy.  Neurological: He is alert and oriented to person, place, and time.  Skin: Skin is warm and dry.  Psychiatric: He has a normal mood and affect. His behavior is normal.        Assessment & Plan:     1. Gastroenteritis  Pt having trouble keeping liquids down, counseled on sedation risks of Phenergan. Cautioned not to take this with Hycodan. Counseled on signs of dehydration and the need to go to ER for IV fluids.  - promethazine (PHENERGAN) 12.5 MG tablet; Take 1 tablet (12.5 mg total) by mouth every 6 (six) hours as needed for nausea or vomiting.  Dispense: 30 tablet; Refill: 0  2. Cough  Rapid flu negative.  - POCT Influenza A/B  Return if symptoms worsen or fail to improve.   Patient Instructions  Viral Gastroenteritis, Adult Introduction Viral gastroenteritis is also known as the stomach flu. This condition is caused by certain germs (viruses). These germs can be passed from person to person very easily (are very contagious). This condition can cause sudden watery poop (diarrhea), fever, and throwing up (vomiting). Having watery poop and throwing up can make you feel weak and cause you to get dehydrated. Dehydration can make you tired and thirsty, make you have a dry mouth, and make it so you pee (urinate) less often. Older adults and people with other diseases or a weak defense system (immune system) are at higher risk for dehydration. It is important to replace the fluids that you lose from having watery poop and throwing up. Follow these instructions at home: Follow instructions  from your doctor about how to care for yourself at home. Eating and drinking Follow these instructions as told by your doctor:  Take an oral rehydration solution (ORS). This is a drink that is sold at pharmacies and stores.  Drink clear fluids in small amounts as you are able, such as:  Water.  Ice chips.  Diluted fruit juice.  Low-calorie sports drinks.  Eat bland, easy-to-digest foods in small amounts as you are able, such as:  Bananas.  Applesauce.  Rice.  Low-fat (lean) meats.  Toast.  Crackers.  Avoid fluids that have a lot of sugar or caffeine in them.  Avoid alcohol.  Avoid spicy or fatty foods. General instructions  Drink enough fluid to keep your pee (urine) clear or pale yellow.  Wash your hands often. If you cannot use soap and water, use hand sanitizer.  Make sure that all people in your home wash their hands well and often.  Rest at home while you get better.  Take over-the-counter and prescription medicines only as told by your doctor.  Watch your condition for any changes.  Take a warm bath to help with any burning or pain from having watery poop.  Keep all follow-up visits as told by your doctor. This is important. Contact a doctor if:  You cannot keep fluids down.  Your symptoms get worse.  You have new symptoms.  You feel light-headed or dizzy.  You have muscle cramps. Get help right away if:  You have chest pain.  You feel very weak or you pass out (faint).  You see blood in your throw-up.  Your throw-up looks like coffee grounds.  You have bloody or black poop (stools) or poop that look like tar.  You have a very bad headache, a stiff neck, or both.  You have a rash.  You have very bad pain, cramping, or bloating in your belly (abdomen).  You have trouble breathing.  You are breathing very quickly.  Your heart is beating very quickly.  Your skin feels cold and clammy.  You feel confused.  You have pain when  you pee.  You have signs of dehydration, such as:  Dark pee, hardly any pee, or no pee.  Cracked lips.  Dry mouth.  Sunken eyes.  Sleepiness.  Weakness. This information is not intended to replace advice given to you by your health care provider. Make sure you discuss any questions you have with your health care provider. Document Released: 12/08/2007 Document Revised: 01/09/2016 Document Reviewed: 02/25/2015  2017 [employer]   The entirety of the information documented in the History of Present Illness, Review of Systems and Physical Exam were personally obtained by me. Portions of this information were initially documented by Ashley Royalty, CMA and reviewed by me for thoroughness and accuracy.        Trinna Post, PA-C  [facility]

## 2016-08-20 NOTE — Patient Instructions (Signed)
Viral Gastroenteritis, Adult Introduction Viral gastroenteritis is also known as the stomach flu. This condition is caused by certain germs (viruses). These germs can be passed from person to person very easily (are very contagious). This condition can cause sudden watery poop (diarrhea), fever, and throwing up (vomiting). Having watery poop and throwing up can make you feel weak and cause you to get dehydrated. Dehydration can make you tired and thirsty, make you have a dry mouth, and make it so you pee (urinate) less often. Older adults and people with other diseases or a weak defense system (immune system) are at higher risk for dehydration. It is important to replace the fluids that you lose from having watery poop and throwing up. Follow these instructions at home: Follow instructions from your doctor about how to care for yourself at home. Eating and drinking Follow these instructions as told by your doctor:  Take an oral rehydration solution (ORS). This is a drink that is sold at pharmacies and stores.  Drink clear fluids in small amounts as you are able, such as:  Water.  Ice chips.  Diluted fruit juice.  Low-calorie sports drinks.  Eat bland, easy-to-digest foods in small amounts as you are able, such as:  Bananas.  Applesauce.  Rice.  Low-fat (lean) meats.  Toast.  Crackers.  Avoid fluids that have a lot of sugar or caffeine in them.  Avoid alcohol.  Avoid spicy or fatty foods. General instructions  Drink enough fluid to keep your pee (urine) clear or pale yellow.  Wash your hands often. If you cannot use soap and water, use hand sanitizer.  Make sure that all people in your home wash their hands well and often.  Rest at home while you get better.  Take over-the-counter and prescription medicines only as told by your doctor.  Watch your condition for any changes.  Take a warm bath to help with any burning or pain from having watery poop.  Keep all  follow-up visits as told by your doctor. This is important. Contact a doctor if:  You cannot keep fluids down.  Your symptoms get worse.  You have new symptoms.  You feel light-headed or dizzy.  You have muscle cramps. Get help right away if:  You have chest pain.  You feel very weak or you pass out (faint).  You see blood in your throw-up.  Your throw-up looks like coffee grounds.  You have bloody or black poop (stools) or poop that look like tar.  You have a very bad headache, a stiff neck, or both.  You have a rash.  You have very bad pain, cramping, or bloating in your belly (abdomen).  You have trouble breathing.  You are breathing very quickly.  Your heart is beating very quickly.  Your skin feels cold and clammy.  You feel confused.  You have pain when you pee.  You have signs of dehydration, such as:  Dark pee, hardly any pee, or no pee.  Cracked lips.  Dry mouth.  Sunken eyes.  Sleepiness.  Weakness. This information is not intended to replace advice given to you by your health care provider. Make sure you discuss any questions you have with your health care provider. Document Released: 12/08/2007 Document Revised: 01/09/2016 Document Reviewed: 02/25/2015  2017 Elsevier  

## 2016-08-23 ENCOUNTER — Other Ambulatory Visit: Payer: Self-pay | Admitting: Family Medicine

## 2016-08-23 ENCOUNTER — Telehealth: Payer: Self-pay

## 2016-08-23 DIAGNOSIS — J019 Acute sinusitis, unspecified: Secondary | ICD-10-CM

## 2016-08-23 MED ORDER — AMOXICILLIN-POT CLAVULANATE 875-125 MG PO TABS: 1.0000 | ORAL_TABLET | Freq: Two times a day (BID) | ORAL | 0 refills | Status: DC

## 2016-08-23 NOTE — Telephone Encounter (Signed)
Patient called this morning and states that he has been seen twice on 08/17/16 by you for URI and then saw Adriana on 08/20/16 for GI issues. He states vomiting and GI issues have resolved but the cough that has been present since he saw you is still there. He is not able to sleep at night due to constant cough, he is coughing up green phlegm. He states he has had chills and low grade fever over the weekend. Adriana on 08/20/16 checked him for flu and it was negative. Patient states Hycodan cough syrup that was given to him on 08/17/16 is not touching the cough, no relief. Please advise patient on what else he can try. He states he can not get any rest. He states he does not want to come in and be exposed to more things. [phone]

## 2016-08-23 NOTE — Telephone Encounter (Signed)
Reports persistent purulent sinus drainage, PND, and accompanying cough on day #9 of viral URI. Will sent in Augmentin to cover for sinusitis.

## 2016-09-23 DIAGNOSIS — Z6829 Body mass index (BMI) 29.0-29.9, adult: Secondary | ICD-10-CM | POA: Diagnosis not present

## 2016-09-23 DIAGNOSIS — M5416 Radiculopathy, lumbar region: Secondary | ICD-10-CM | POA: Diagnosis not present

## 2016-10-25 DIAGNOSIS — M5146 Schmorl's nodes, lumbar region: Secondary | ICD-10-CM | POA: Diagnosis not present

## 2016-10-25 DIAGNOSIS — M5116 Intervertebral disc disorders with radiculopathy, lumbar region: Secondary | ICD-10-CM | POA: Diagnosis not present

## 2016-10-25 DIAGNOSIS — M5117 Intervertebral disc disorders with radiculopathy, lumbosacral region: Secondary | ICD-10-CM | POA: Diagnosis not present

## 2016-10-25 DIAGNOSIS — M4726 Other spondylosis with radiculopathy, lumbar region: Secondary | ICD-10-CM | POA: Diagnosis not present

## 2016-10-29 DIAGNOSIS — M48061 Spinal stenosis, lumbar region without neurogenic claudication: Secondary | ICD-10-CM | POA: Diagnosis not present

## 2016-10-29 DIAGNOSIS — Z6830 Body mass index (BMI) 30.0-30.9, adult: Secondary | ICD-10-CM | POA: Diagnosis not present

## 2016-11-16 DIAGNOSIS — M48061 Spinal stenosis, lumbar region without neurogenic claudication: Secondary | ICD-10-CM | POA: Diagnosis not present

## 2016-11-17 ENCOUNTER — Ambulatory Visit (INDEPENDENT_AMBULATORY_CARE_PROVIDER_SITE_OTHER): Payer: BLUE CROSS/BLUE SHIELD | Admitting: Family Medicine

## 2016-11-17 ENCOUNTER — Encounter: Payer: Self-pay | Admitting: Family Medicine

## 2016-11-17 ENCOUNTER — Other Ambulatory Visit: Payer: Self-pay | Admitting: Family Medicine

## 2016-11-17 VITALS — BP 112/70 | HR 53 | Temp 97.9°F | Resp 16 | Ht 70.5 in | Wt 214.6 lb

## 2016-11-17 DIAGNOSIS — I2510 Atherosclerotic heart disease of native coronary artery without angina pectoris: Secondary | ICD-10-CM | POA: Diagnosis not present

## 2016-11-17 DIAGNOSIS — L989 Disorder of the skin and subcutaneous tissue, unspecified: Secondary | ICD-10-CM | POA: Diagnosis not present

## 2016-11-17 DIAGNOSIS — M48062 Spinal stenosis, lumbar region with neurogenic claudication: Secondary | ICD-10-CM | POA: Diagnosis not present

## 2016-11-17 DIAGNOSIS — Z23 Encounter for immunization: Secondary | ICD-10-CM

## 2016-11-17 DIAGNOSIS — Z0000 Encounter for general adult medical examination without abnormal findings: Secondary | ICD-10-CM

## 2016-11-17 DIAGNOSIS — L918 Other hypertrophic disorders of the skin: Secondary | ICD-10-CM | POA: Diagnosis not present

## 2016-11-17 NOTE — Progress Notes (Signed)
Subjective:     Patient ID: Ryan English, male   DOB: 1950/10/19, 66 y.o.   MRN: [id number]  HPI  Chief Complaint  Patient presents with  . Annual Exam    Patient comes in office today for his annual physical he states that he feels well today and has no complaints. Patient is following a healthy diet, exercising 7x a week and reports libido is normal. Last Tdap: 05/16/16, Shingles 08/21/12, PSA 11/14/15 and Colonoscopy 09/09/10.   Continues to work at [employer]. Followed annually by cardiology, Dr. Nehemiah Massed and intercurrently by the [facility] for lumbar spinal stenosis. He is anticipating back surgery in the Fall. Wishes rx for Shingrix vaccine.   Review of Systems General: Feeling well and tolerating back discomfort without use of pain medication. HEENT: regular dental visits but is overdue for eye exam and encouraged to schedule. Cardiovascular: no chest pain, shortness of breath, or palpitations GI: Heartburn controlled with PPI. Has tried on one occasion to come off "cold [place]" with rebound acid production. No change in bowel habits. GU: nocturia x 0, no change in bladder habits  Psychiatric: not depressed Musculoskeletal: no joint pain Skin: has skin lesion on right lower leg which has returned despite shave excision per dermatology several years ago. Also has a skin tag on his right neck he wishes removed.    Objective:   Physical Exam  Constitutional: He appears well-developed and well-nourished. No distress.  Eyes: PERRLA, EOMI Neck: no thyromegaly, tenderness or nodules, no cervical adenopathy, no carotid bruits ENT: TM's intact without inflammation; No tonsillar enlargement or exudate, Lungs: Clear Heart :Bradycardic per baseline without murmur or gallop Abd: bowel sounds present, soft, non-tender, no organomegaly Rectal: Prostate firm and non-tender Extremities: no edema Skin: right neck with 2 mm pedunculated skin tag frozen with Cryopen for 25 seconds  each side. Right distal lower leg with raised irregularly shaped tan lesion.     Assessment:    1. Annual physical exam: had prior labs in January with his cardiologist - Renal function panel - PSA  2. Spinal stenosis of lumbar region with neurogenic claudication: per [facility]  3. Atherosclerosis of native coronary artery of native heart without angina pectoris; per cardiology  4. Skin lesion of right leg - Ambulatory referral to Dermatology  5. Need for vaccination against Streptococcus pneumoniae - Pneumococcal conjugate vaccine 13-valent IM  6. ST (skin tag) - Cryotherapy/destruction benign or premalignant lesion    Plan:    Rx for Shingrix but told to wait for 4 weeks due to immunization today. Discussed tapering omeprazole while adding Zantac 150 mg.

## 2016-11-17 NOTE — Patient Instructions (Addendum)
We will call you with the lab results and the dermatology referral. Try get off omeprazole as follows: Add Zantac 150 mg.at bedtime and try to take the omeprazole every other day for 2 weeks. Next add Zantac 150 mg.twice daily and stop the omeprazole. Wait for four weeks before getting the Shingrix vaccine.

## 2016-11-24 DIAGNOSIS — M48061 Spinal stenosis, lumbar region without neurogenic claudication: Secondary | ICD-10-CM | POA: Diagnosis not present

## 2016-12-02 DIAGNOSIS — Z0000 Encounter for general adult medical examination without abnormal findings: Secondary | ICD-10-CM | POA: Diagnosis not present

## 2016-12-02 DIAGNOSIS — M48061 Spinal stenosis, lumbar region without neurogenic claudication: Secondary | ICD-10-CM | POA: Diagnosis not present

## 2016-12-03 LAB — RENAL FUNCTION PANEL
ALBUMIN: 4.6 g/dL (ref 3.6–4.8)
BUN / CREAT RATIO: 11 (ref 10–24)
BUN: 11 mg/dL (ref 8–27)
CHLORIDE: 102 mmol/L (ref 96–106)
CO2: 24 mmol/L (ref 18–29)
CREATININE: 1.04 mg/dL (ref 0.76–1.27)
Calcium: 9.6 mg/dL (ref 8.6–10.2)
GFR calc Af Amer: 86 mL/min/{1.73_m2} (ref >59)
GFR calc non Af Amer: 74 mL/min/{1.73_m2} (ref >59)
Glucose: 89 mg/dL (ref 65–99)
PHOSPHORUS: 3.3 mg/dL (ref 2.5–4.5)
Potassium: 4.5 mmol/L (ref 3.5–5.2)
Sodium: 142 mmol/L (ref 134–144)

## 2016-12-03 LAB — PSA: PROSTATE SPECIFIC AG, SERUM: 0.9 ng/mL (ref 0.0–4.0)

## 2017-01-13 DIAGNOSIS — L570 Actinic keratosis: Secondary | ICD-10-CM | POA: Diagnosis not present

## 2017-01-13 DIAGNOSIS — L821 Other seborrheic keratosis: Secondary | ICD-10-CM | POA: Diagnosis not present

## 2017-01-13 DIAGNOSIS — L820 Inflamed seborrheic keratosis: Secondary | ICD-10-CM | POA: Diagnosis not present

## 2017-01-23 ENCOUNTER — Other Ambulatory Visit: Payer: Self-pay | Admitting: Physician Assistant

## 2017-01-23 DIAGNOSIS — J3089 Other allergic rhinitis: Secondary | ICD-10-CM

## 2017-02-18 IMAGING — MR MR LUMBAR SPINE W/O CM
4 of 5 series · 18 of 48 positions shown · non-contrast
Comparison: 06/25/2014

CLINICAL DATA: Repeat examination for low back pain and left thigh
pain, 18 months duration.

EXAM:
MRI LUMBAR SPINE WITHOUT CONTRAST
TECHNIQUE: Multiplanar, multisequence MR imaging of the lumbar spine was
performed. No intravenous contrast was administered.

[Series 6: T2 · sagittal · 4.0mm · 0.73mm/px · 6 of 16 slices shown (1 of 2)]
[im 1/16]
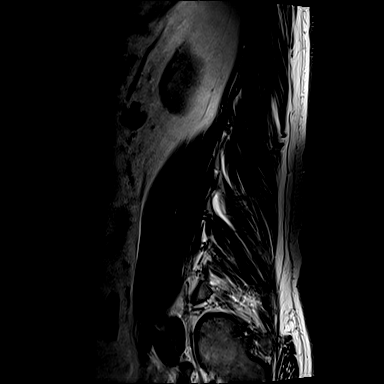
[im 4/16]
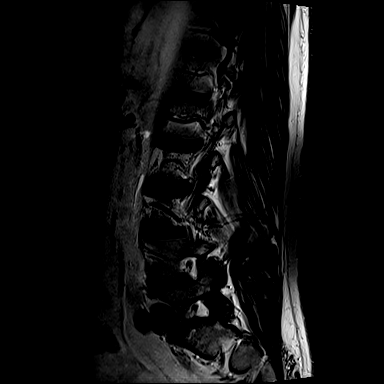
[im 7/16]
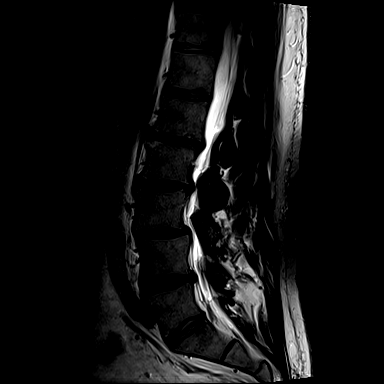
[im 10/16]
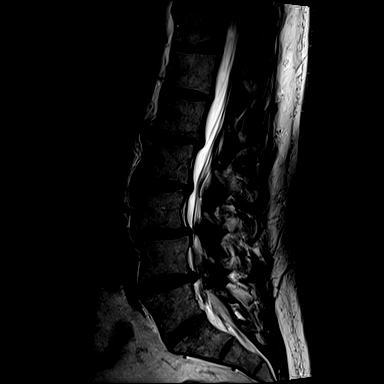
[im 13/16]
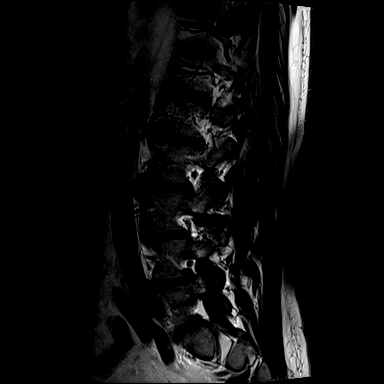
[im 16/16]
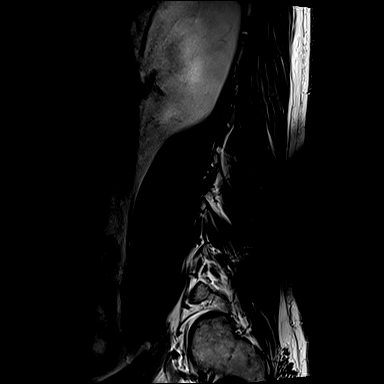

[Series 8: T1 · sagittal · 4.0mm · 0.73mm/px · 3 of 16 slices shown (1 of 2)]
[im 3/16]
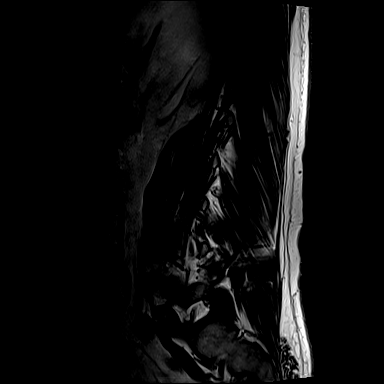
[im 8/16]
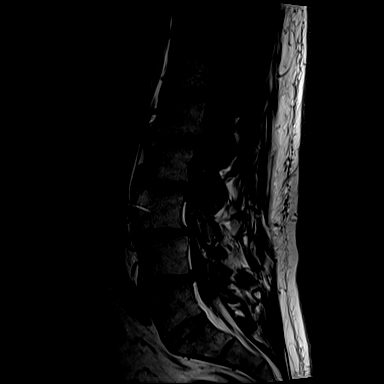
[im 13/16]
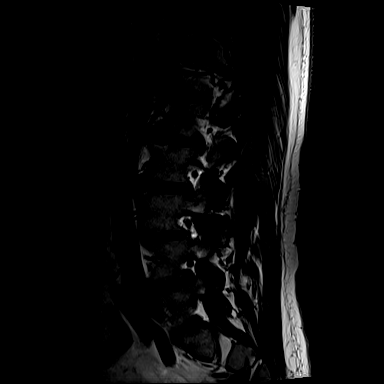

[Series 13: T2 · axial · 4.0mm · 0.28mm/px · z∈[-140,+17]mm · 6 of 32 slices shown (2 of 2)]
[im 1/32]
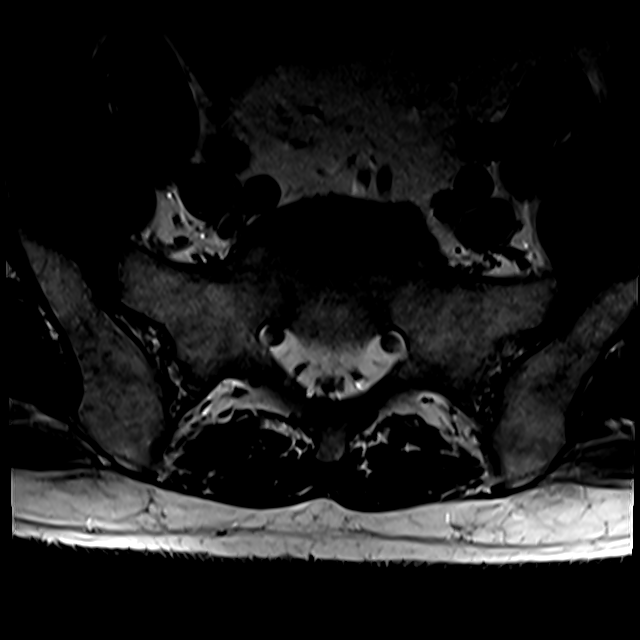
[im 5/32]
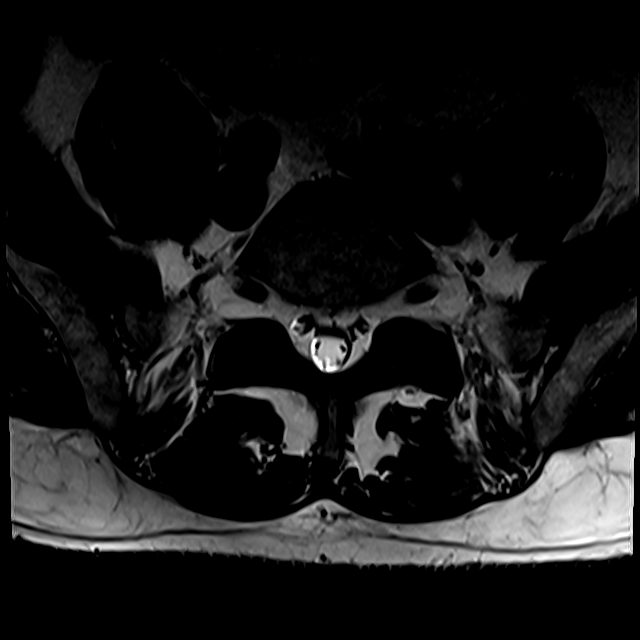
[im 10/32]
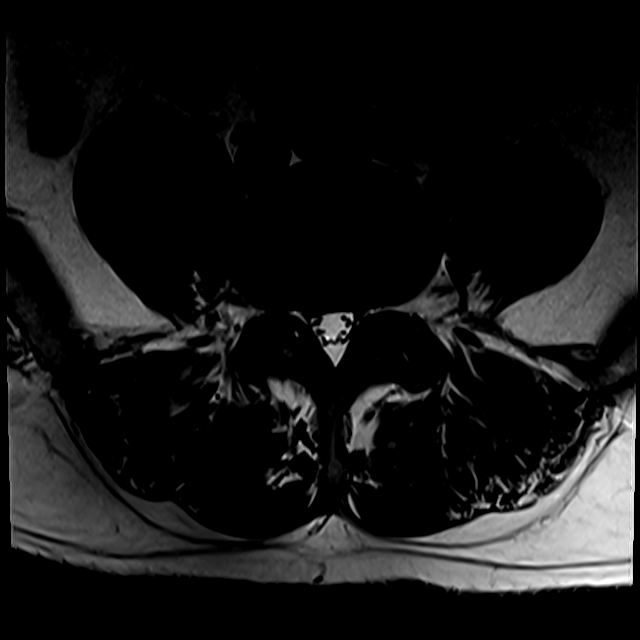
[im 15/32]
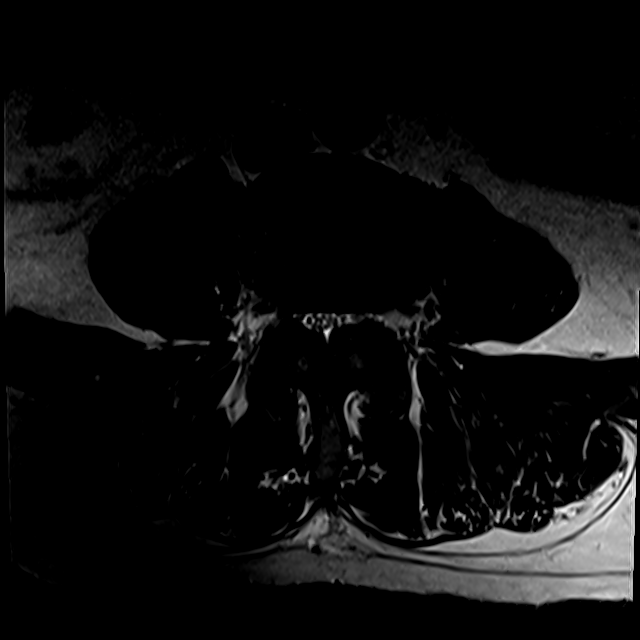
[im 17/32]
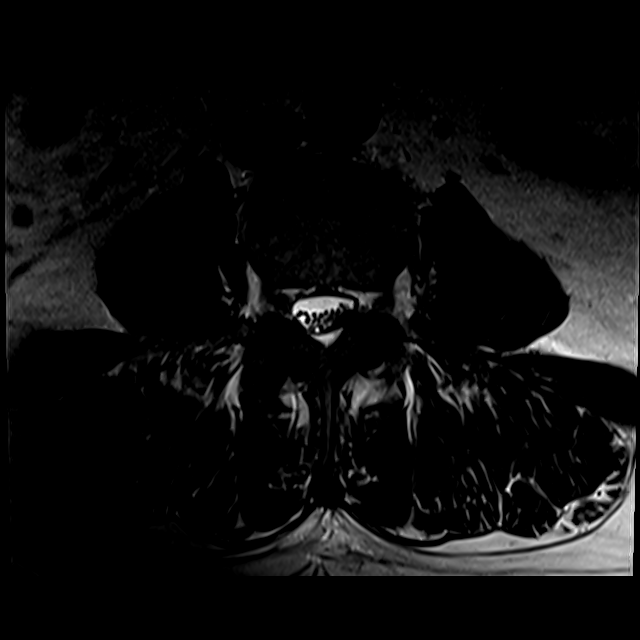
[im 27/32]
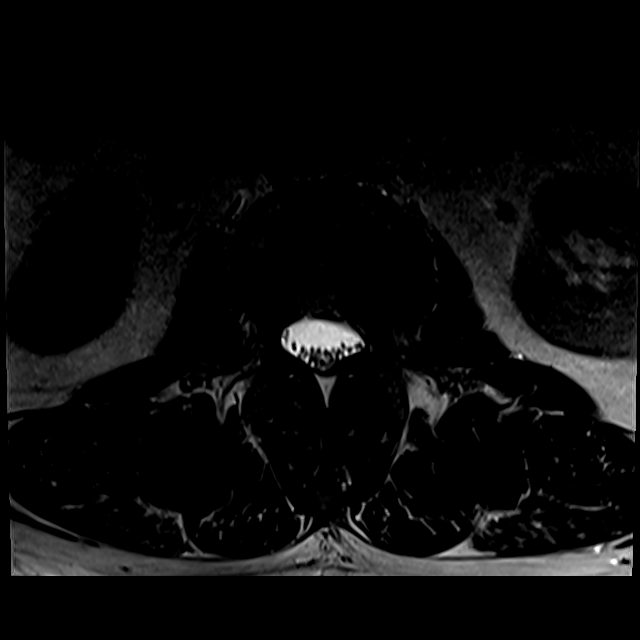

[Series 102: T1 · axial · 4.0mm · 0.28mm/px · z∈[-120,+17]mm · 3 of 32 slices shown (2 of 2)]
[im 5/32]
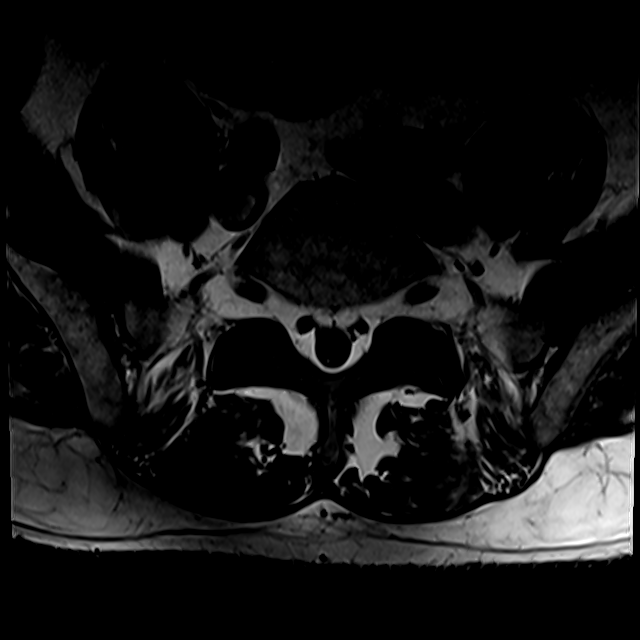
[im 17/32]
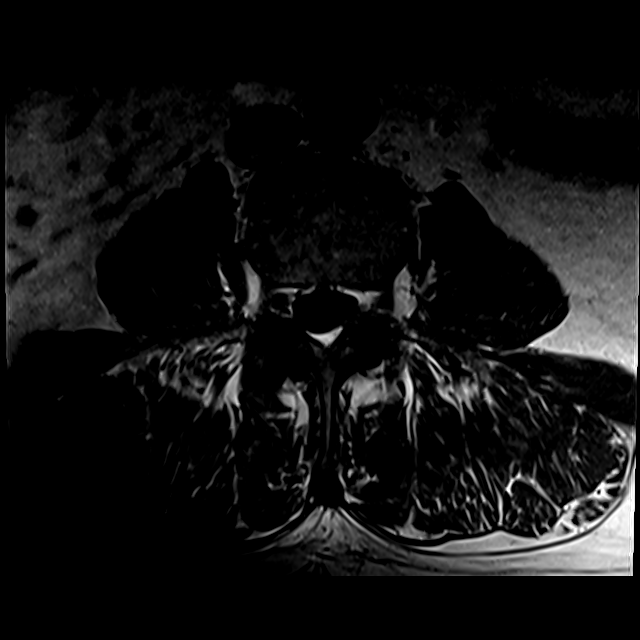
[im 27/32]
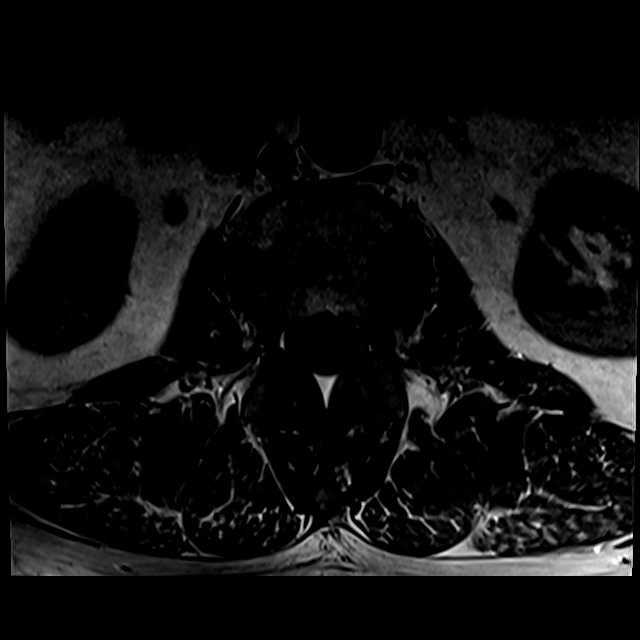

[18 of 48 positions shown; findings below may reference images not displayed]

FINDINGS: The distal cord and conus are normal with the conus tip at T12-L1.
No stenosis or neural compression.

L1-2: Retrolisthesis of 2 mm. Mild bulging of the disc. Mild
narrowing of both lateral recesses without neural compression.

L2-3: Retrolisthesis of 2 mm. Bulging of the disc with a focal
protrusion in the left foraminal to extra foraminal region. Facet
and ligamentous hypertrophy. Stenosis of both lateral recesses left
more than right. The left L2 nerve root could be affected in the
foraminal to extra foraminal level. Disc material previously seen
migrated upward behind L2 has desiccated and involuted and is
virtually no longer visible.

L3-4: Advanced bilateral facet arthropathy with anterolisthesis of 4
mm. Degeneration and bulging of the disc. Moderate to severe
multifactorial stenosis at this level that could cause neural
compression on either or both sides. The appearance could worsen
with standing or flexion.

L4-5: Disc degeneration with circumferential bulging and shallow
herniation in the right posterior lateral to foraminal region. Facet
and ligamentous hypertrophy. Narrowing of the lateral recesses and
foramina right more than left. Neural irritation could occur on the
right.

L5-S1: Normal appearance of the disc. Minimal facet degeneration. No
stenosis or neural compression.
IMPRESSION: L1-2: Retrolisthesis of 2 mm. Bulging of the disc. No compressive
stenosis.

L2-3: Retrolisthesis of 2 mm. Circumferential bulging of the disc,
focally prominent in the left foraminal to extra foraminal region.
Facet and ligamentous hypertrophy. Stenosis of the lateral recesses
left more than right. The L2 nerve root could be affected in the
foraminal to extra foraminal region. Previously seen disc material
migrated upward behind L2 to the left of the midline has desiccated
and is virtually invisible.

L3-4: Advanced bilateral facet arthropathy with anterolisthesis of 4
mm. Bulging of the disc. Multifactorial spinal stenosis that could
cause neural compression on either or both sides. Similar appearance
to the previous study.

L4-5: Shallow disc protrusion more prominent in the right posterior
lateral direction. Some potential for neural compression in the
intervertebral foramen and lateral recess on the right. No change
since the previous study.

## 2017-06-16 DIAGNOSIS — M25541 Pain in joints of right hand: Secondary | ICD-10-CM | POA: Diagnosis not present

## 2017-06-16 DIAGNOSIS — M19141 Post-traumatic osteoarthritis, right hand: Secondary | ICD-10-CM | POA: Diagnosis not present

## 2017-06-21 ENCOUNTER — Ambulatory Visit: Payer: BLUE CROSS/BLUE SHIELD | Admitting: Family Medicine

## 2017-06-21 ENCOUNTER — Encounter: Payer: Self-pay | Admitting: Family Medicine

## 2017-06-21 VITALS — BP 130/88 | HR 54 | Temp 98.0°F | Resp 14 | Wt 211.6 lb

## 2017-06-21 DIAGNOSIS — J069 Acute upper respiratory infection, unspecified: Secondary | ICD-10-CM

## 2017-06-21 MED ORDER — HYDROCODONE-HOMATROPINE 5-1.5 MG/5ML PO SYRP: ORAL_SOLUTION | ORAL | 0 refills | Status: DC

## 2017-06-21 NOTE — Patient Instructions (Addendum)
Discussed use of Mucinex D for congestion. Try Pepcid complete instead of Zantac.

## 2017-06-21 NOTE — Progress Notes (Signed)
Subjective:     Patient ID: Alfonse Spruce, male   DOB: 11-25-1950, 66 y.o.   MRN: [id number] Chief Complaint  Patient presents with  . Cough    Patient comes in office today to address cough and congestion for the past 2-3 days. Patient states that cough is productive  of green mucous,  patient reports shortness of breath and wheezing. Patient states that he has been taking otc allergy medicine.    HPI States wife has been sick as well.  Review of Systems  Gastrointestinal:       Suboptimal control of GERD with Zantac. Suggested he try Pepcid complete.       Objective:   Physical Exam  Constitutional: He appears well-developed and well-nourished. No distress.  Ears: T.M's intact without inflammation Throat: no tonsillar enlargement or exudate Neck: no cervical adenopathy Lungs: clear     Assessment:    1. Viral upper respiratory tract infection - HYDROcodone-homatropine (HYCODAN) 5-1.5 MG/5ML syrup; 5 ml 4-6 hours as needed for cough  Dispense: 120 mL; Refill: 0    Plan:    Discussed use of Mucinex D and natural history of his illness.

## 2017-06-27 DIAGNOSIS — M48062 Spinal stenosis, lumbar region with neurogenic claudication: Secondary | ICD-10-CM | POA: Diagnosis not present

## 2017-06-29 ENCOUNTER — Other Ambulatory Visit: Payer: Self-pay | Admitting: Family Medicine

## 2017-06-29 ENCOUNTER — Telehealth: Payer: Self-pay | Admitting: Physician Assistant

## 2017-06-29 MED ORDER — AMOXICILLIN-POT CLAVULANATE 875-125 MG PO TABS: 1.0000 | ORAL_TABLET | Freq: Two times a day (BID) | ORAL | 0 refills | Status: DC

## 2017-06-29 NOTE — Telephone Encounter (Signed)
Pt was in last week with sinus stuff and cough.  He is no better and would like something else called in.  Thinks he may have a slight fever   He uses [facility] [place]

## 2017-06-29 NOTE — Telephone Encounter (Signed)
Patient reports increased sinus pressure, purulent sinus drainage, post nasal drainage and accompanying cough. Will rx with Augmentin.

## 2017-06-29 NOTE — Telephone Encounter (Signed)
Seen in office 06/21/17.KW

## 2017-06-29 NOTE — Telephone Encounter (Signed)
See below. KW 

## 2017-06-29 NOTE — Telephone Encounter (Signed)
Please review. KW 

## 2017-06-30 DIAGNOSIS — I2510 Atherosclerotic heart disease of native coronary artery without angina pectoris: Secondary | ICD-10-CM | POA: Diagnosis not present

## 2017-06-30 DIAGNOSIS — R079 Chest pain, unspecified: Secondary | ICD-10-CM | POA: Diagnosis not present

## 2017-06-30 DIAGNOSIS — E782 Mixed hyperlipidemia: Secondary | ICD-10-CM | POA: Diagnosis not present

## 2017-07-15 ENCOUNTER — Other Ambulatory Visit: Payer: Self-pay | Admitting: Family Medicine

## 2017-07-15 ENCOUNTER — Encounter: Payer: Self-pay | Admitting: Family Medicine

## 2017-07-15 ENCOUNTER — Telehealth: Payer: Self-pay | Admitting: Family Medicine

## 2017-07-15 ENCOUNTER — Ambulatory Visit: Payer: BLUE CROSS/BLUE SHIELD | Admitting: Family Medicine

## 2017-07-15 VITALS — BP 124/82 | HR 61 | Temp 97.8°F | Resp 16 | Wt 214.2 lb

## 2017-07-15 DIAGNOSIS — J069 Acute upper respiratory infection, unspecified: Secondary | ICD-10-CM

## 2017-07-15 NOTE — Telephone Encounter (Signed)
Patient advised and appt scheduled for this afternoon. KW

## 2017-07-15 NOTE — Telephone Encounter (Signed)
Pt stated he saw Mikki Santee on 06/21/17 and was advised to start taking amoxicillin-clavulanate (AUGMENTIN) 875-125 MG tablet on 06/29/17 and pt completed the medication on or around 07/08/17. Pt stated he was feeling much better but the last 2 days his sore throat, cough, & nasal drainage has returned. Pt is requesting a call back to discuss if he needs another round of medication or what he should do. Pt stated that he doesn't want to get as bad as it was before taking the medication. [facility]. Please advise. Thanks TNP

## 2017-07-15 NOTE — Progress Notes (Signed)
Subjective:     Patient ID: Ryan English, male   DOB: 30-Jan-1951, 67 y.o.   MRN: [id number] Chief Complaint  Patient presents with  . URI    Patient returns back to office to follow up, patient states that he finished antibiotic last friday and cough is still present but slightly improved. Patient reports sinus drainage and scratchy throat, patient states that cough is productive of mucous at night. Patient has been taking prescription Hycodan for cough.    HPI States he was feeling well after completing Augmentin for a sinus infection with slight residual cough. Reports on Tuesday, 1/8, he developed cold sx again with sore throat, sinus congestion and increasing cough. He has been exposed to children though he did not think they were ill.  Review of Systems     Objective:   Physical Exam  Constitutional: He appears well-developed and well-nourished. No distress.  Ears: T.M's intact without inflammation Throat: no tonsillar enlargement or exudate Neck: no cervical adenopathy Lungs: clear     Assessment:    1. Viral upper respiratory tract infection    Plan:    Discussed resuming symptomatic treatment. He prefers honey for cough May call early in the next week if not improving.

## 2017-07-15 NOTE — Patient Instructions (Signed)
Discussed use of Mucinex D for congestion, and honey for cough. Call me if not improving next Monday or Tuesday.

## 2017-07-15 NOTE — Telephone Encounter (Signed)
Spoke with patient on the phone he states that he has a scratchy throat and productive cough that began two days ago, he states that he is still taking Hycodan at night for cough and using otc Mucinex. Please advise if patient needs to return back to office. KW

## 2017-07-15 NOTE — Telephone Encounter (Signed)
This sounds like a new URI. So would resume Mucinex D, Delsym and the rx cough syrup as needed. If he does not feel he had a period of wellness before the onset of these current symptoms would want to see him again.

## 2017-07-20 ENCOUNTER — Other Ambulatory Visit: Payer: Self-pay | Admitting: Physician Assistant

## 2017-07-20 DIAGNOSIS — J3089 Other allergic rhinitis: Secondary | ICD-10-CM

## 2017-11-22 ENCOUNTER — Ambulatory Visit (INDEPENDENT_AMBULATORY_CARE_PROVIDER_SITE_OTHER): Payer: BLUE CROSS/BLUE SHIELD | Admitting: Family Medicine

## 2017-11-22 ENCOUNTER — Encounter: Payer: Self-pay | Admitting: Family Medicine

## 2017-11-22 VITALS — BP 118/70 | HR 50 | Temp 97.4°F | Resp 14 | Ht 71.0 in | Wt 207.0 lb

## 2017-11-22 DIAGNOSIS — Z789 Other specified health status: Secondary | ICD-10-CM | POA: Diagnosis not present

## 2017-11-22 DIAGNOSIS — Z125 Encounter for screening for malignant neoplasm of prostate: Secondary | ICD-10-CM

## 2017-11-22 DIAGNOSIS — M20001 Unspecified deformity of right finger(s): Secondary | ICD-10-CM

## 2017-11-22 DIAGNOSIS — Z0000 Encounter for general adult medical examination without abnormal findings: Secondary | ICD-10-CM | POA: Diagnosis not present

## 2017-11-22 DIAGNOSIS — Z23 Encounter for immunization: Secondary | ICD-10-CM

## 2017-11-22 DIAGNOSIS — E782 Mixed hyperlipidemia: Secondary | ICD-10-CM

## 2017-11-22 NOTE — Patient Instructions (Signed)
Preventive Care 65 Years and Older, Male Preventive care refers to lifestyle choices and visits with your health care provider that can promote health and wellness. What does preventive care include?  A yearly physical exam. This is also called an annual well check.  Dental exams once or twice a year.  Routine eye exams. Ask your health care provider how often you should have your eyes checked.  Personal lifestyle choices, including: ? Daily care of your teeth and gums. ? Regular physical activity. ? Eating a healthy diet. ? Avoiding tobacco and drug use. ? Limiting alcohol use. ? Practicing safe sex. ? Taking low doses of aspirin every day. ? Taking vitamin and mineral supplements as recommended by your health care provider. What happens during an annual well check? The services and screenings done by your health care provider during your annual well check will depend on your age, overall health, lifestyle risk factors, and family history of disease. Counseling Your health care provider may ask you questions about your:  Alcohol use.  Tobacco use.  Drug use.  Emotional well-being.  Home and relationship well-being.  Sexual activity.  Eating habits.  History of falls.  Memory and ability to understand (cognition).  Work and work environment.  Screening You may have the following tests or measurements:  Height, weight, and BMI.  Blood pressure.  Lipid and cholesterol levels. These may be checked every 5 years, or more frequently if you are over 50 years old.  Skin check.  Lung cancer screening. You may have this screening every year starting at age 55 if you have a 30-pack-year history of smoking and currently smoke or have quit within the past 15 years.  Fecal occult blood test (FOBT) of the stool. You may have this test every year starting at age 50.  Flexible sigmoidoscopy or colonoscopy. You may have a sigmoidoscopy every 5 years or a colonoscopy every 10  years starting at age 50.  Prostate cancer screening. Recommendations will vary depending on your family history and other risks.  Hepatitis C blood test.  Hepatitis B blood test.  Sexually transmitted disease (STD) testing.  Diabetes screening. This is done by checking your blood sugar (glucose) after you have not eaten for a while (fasting). You may have this done every 1-3 years.  Abdominal aortic aneurysm (AAA) screening. You may need this if you are a current or former smoker.  Osteoporosis. You may be screened starting at age 70 if you are at high risk.  Talk with your health care provider about your test results, treatment options, and if necessary, the need for more tests. Vaccines Your health care provider may recommend certain vaccines, such as:  Influenza vaccine. This is recommended every year.  Tetanus, diphtheria, and acellular pertussis (Tdap, Td) vaccine. You may need a Td booster every 10 years.  Varicella vaccine. You may need this if you have not been vaccinated.  Zoster vaccine. You may need this after age 60.  Measles, mumps, and rubella (MMR) vaccine. You may need at least one dose of MMR if you were born in 1957 or later. You may also need a second dose.  Pneumococcal 13-valent conjugate (PCV13) vaccine. One dose is recommended after age 65.  Pneumococcal polysaccharide (PPSV23) vaccine. One dose is recommended after age 65.  Meningococcal vaccine. You may need this if you have certain conditions.  Hepatitis A vaccine. You may need this if you have certain conditions or if you travel or work in places where you   may be exposed to hepatitis A.  Hepatitis B vaccine. You may need this if you have certain conditions or if you travel or work in places where you may be exposed to hepatitis B.  Haemophilus influenzae type b (Hib) vaccine. You may need this if you have certain risk factors.  Talk to your health care provider about which screenings and vaccines  you need and how often you need them. This information is not intended to replace advice given to you by your health care provider. Make sure you discuss any questions you have with your health care provider. Document Released: 07/18/2015 Document Revised: 03/10/2016 Document Reviewed: 04/22/2015 Elsevier Interactive Patient Education  2018 [employer]  

## 2017-11-22 NOTE — Progress Notes (Signed)
[name], Male    DOB: 1950-10-13, 67 y.o.   MRN: [id number] Visit Date: 11/22/2017  Today's Provider: Lavon Paganini, MD   Chief Complaint  Patient presents with  . Annual Exam   Subjective:    Annual physical exam Ryan English is a 67 y.o. male who presents today for health maintenance and complete physical. He feels well. He reports he is exercising. He reports he is sleeping well.  Is concerned about deformity of L ring finger.  Been present for many years.  Used to play football and finger got caught in many face masks.  He states that Dr. Marry Guan got an x-ray and showed degeneration of joint space.  It was recommended that he see a Copy.  There is swelling and pain of this area. ----------------------------------------------------------------- Colonoscopy- 09/09/10 Normal repeat 10 years  Review of Systems  Constitutional: Negative.   HENT: Negative.   Eyes: Negative.   Respiratory: Negative.   Cardiovascular: Negative.   Gastrointestinal: Negative.   Endocrine: Negative.   Genitourinary: Negative.   Musculoskeletal: Positive for arthralgias, back pain and myalgias.  Skin: Negative.   Allergic/Immunologic: Negative.   Neurological: Negative.   Hematological: Negative.   Psychiatric/Behavioral: Negative.     Social History      He  reports that he has never smoked. He has never used smokeless tobacco. He reports that he drinks about 3.6 oz of alcohol per week. He reports that he does not use drugs.       Social History   Socioeconomic History  . Marital status: Married    Spouse name: Not on file  . Number of children: 2  . Years of education: Not on file  . Highest education level: Not on file  Occupational History  . Not on file  Social Needs  . Financial resource strain: Not on file  . Food insecurity:    Worry: Not on file    Inability: Not on file  . Transportation needs:    Medical: Not on file    Non-medical: Not  on file  Tobacco Use  . Smoking status: Never Smoker  . Smokeless tobacco: Never Used  Substance and Sexual Activity  . Alcohol use: Yes    Alcohol/week: 3.6 oz    Types: 6 Cans of beer per week    Comment: occasionally  . Drug use: No  . Sexual activity: Not on file  Lifestyle  . Physical activity:    Days per week: Not on file    Minutes per session: Not on file  . Stress: Not on file  Relationships  . Social connections:    Talks on phone: Not on file    Gets together: Not on file    Attends religious service: Not on file    Active member of club or organization: Not on file    Attends meetings of clubs or organizations: Not on file    Relationship status: Not on file  Other Topics Concern  . Not on file  Social History Narrative  . Not on file    Past Medical History:  Diagnosis Date  . Arthritis   . GERD (gastroesophageal reflux disease)   . Hyperlipidemia      Patient Active Problem List   Diagnosis Date Noted  . Degenerative arthritis of spine 11/06/2015  . Neuralgia neuritis, sciatic nerve 11/06/2015  . Lumbar canal stenosis 11/06/2015  . SPL (spondylolisthesis) 11/06/2015  . Adrenal adenoma 05/26/2015  .  Atherosclerosis of coronary artery 05/26/2015  . Atypical chest pain 05/26/2015  . Allergic rhinitis 04/09/2015  . Bradycardia 08/02/2014  . Combined fat and carbohydrate induced hyperlipemia 08/02/2014    Past Surgical History:  Procedure Laterality Date  . [place] History        Family Status  Relation Name Status  . Mother  Deceased at age 86  . Father  Deceased at age 13's       MVA  . Sister  Deceased at age 25       DM  . Brother  Alive  . PGM  Alive  . Sister  Alive  . Brother  Deceased at age 45       MI  . Brother  Alive  . Brother  Alive        His family history includes Cancer in his mother; Diabetes in his brother, brother, sister, and sister; Heart disease in his brother and brother.      No Known  Allergies   Current Outpatient Medications:  .  acetaminophen (TYLENOL) 500 MG tablet, Take 1,000 mg by mouth., Disp: , Rfl:  .  aspirin 81 MG tablet, Take 81 mg by mouth daily., Disp: , Rfl:  .  atorvastatin (LIPITOR) 40 MG tablet, Take 1 tablet by mouth daily., Disp: , Rfl:  .  Misc Natural Products (OSTEO BI-FLEX JOINT SHIELD PO), Take 2 tablets by mouth daily., Disp: , Rfl:  .  montelukast (SINGULAIR) 10 MG tablet, TAKE 1 TABLET (10 MG TOTAL) BY MOUTH AT BEDTIME., Disp: 90 tablet, Rfl: 1 .  Omega-3 Fatty Acids (FISH OIL) 1200 MG CAPS, Take 1 capsule by mouth daily. Reported on 08/20/2015, Disp: , Rfl:  .  ranitidine (ZANTAC) 150 MG tablet, Take by mouth., Disp: , Rfl:  .  HYDROcodone-homatropine (HYCODAN) 5-1.5 MG/5ML syrup, TAKE 1 TEASPOONFUL (5MLS) BY MOUTH EVERY 4 TO 6 HOURS AS NEEDED FOR COUGH, Disp: , Rfl:  .  loratadine (CLARITIN) 10 MG tablet, Take by mouth., Disp: , Rfl:    Patient Care Team: Virginia Crews, MD as PCP - General (Family Medicine) Bary Castilla, Forest Gleason, MD as Consulting Physician (General Surgery)      Objective:   Vitals: BP 118/70 (BP Location: Left Arm, Patient Position: Sitting, Cuff Size: Normal)   Pulse (!) 50   Temp (!) 97.4 F (36.3 C) (Oral)   Resp 14   Ht 5' 11"  (1.803 m)   Wt 207 lb (93.9 kg)   BMI 28.87 kg/m    Vitals:   11/22/17 1019  BP: 118/70  Pulse: (!) 50  Resp: 14  Temp: (!) 97.4 F (36.3 C)  TempSrc: Oral  Weight: 207 lb (93.9 kg)  Height: 5' 11"  (1.803 m)     Physical Exam  Constitutional: He is oriented to person, place, and time. He appears well-developed and well-nourished. No distress.  HENT:  Head: Normocephalic and atraumatic.  Right Ear: External ear normal.  Left Ear: External ear normal.  Nose: Nose normal.  Mouth/Throat: Oropharynx is clear and moist.  Eyes: Pupils are equal, round, and reactive to light. Conjunctivae and EOM are normal. No scleral icterus.  Neck: Neck supple. No thyromegaly present.    Cardiovascular: Normal rate, regular rhythm, normal heart sounds and intact distal pulses.  No murmur heard. Pulmonary/Chest: Effort normal and breath sounds normal. No respiratory distress. He has no wheezes. He has no rales.  Abdominal: Soft. Bowel sounds are normal. He exhibits no  distension. There is no tenderness. There is no rebound and no guarding.  Musculoskeletal: He exhibits no edema.  Right hand: Ring finger with medial deformity of PIP joint with swelling and tenderness diffusely  Lymphadenopathy:    He has no cervical adenopathy.  Neurological: He is alert and oriented to person, place, and time.  Skin: Skin is warm and dry. Capillary refill takes less than 2 seconds. No rash noted.  Psychiatric: He has a normal mood and affect. His behavior is normal.  Vitals reviewed.    Depression Screen PHQ 2/9 Scores 11/22/2017 11/17/2016 11/06/2015  PHQ - 2 Score 0 0 0  PHQ- 9 Score 0 - -     Assessment & Plan:     Routine Health Maintenance and Physical Exam  Exercise Activities and Dietary recommendations Goals    None      Immunization History  Administered Date(s) Administered  . Influenza, High Dose Seasonal PF 06/07/2017  . Influenza,inj,Quad PF,6+ Mos 04/05/2015  . Pneumococcal Conjugate-13 11/17/2016  . Td 05/17/2007  . Tdap 05/17/2007, 09/23/2017  . Zoster 08/21/2012    Health Maintenance  Topic Date Due  . PNA vac Low Risk Adult (2 of 2 - PPSV23) 11/17/2017  . INFLUENZA VACCINE  02/02/2018  . COLONOSCOPY  09/08/2020  . TETANUS/TDAP  09/24/2027  . Hepatitis C Screening  Completed     Discussed health benefits of physical activity, and encouraged him to engage in regular exercise appropriate for his age and condition.    --------------------------------------------------------------------  Problem List Items Addressed This Visit      Other   Combined fat and carbohydrate induced hyperlipemia   Relevant Orders   Lipid panel   Comprehensive  metabolic panel    Other Visit Diagnoses    Encounter for annual physical exam    -  Primary   Relevant Orders   Lipid panel   Comprehensive metabolic panel   CBC   Finger deformity, acquired, right       Relevant Orders   Ambulatory referral to Hand Surgery   Screening for prostate cancer       Relevant Orders   PSA Total (Reflex To Free)   Need for pneumococcal vaccination       Relevant Orders   Pneumococcal polysaccharide vaccine 23-valent greater than or equal to 2yo subcutaneous/IM (Completed)   Measles, mumps, rubella (MMR) vaccination status unknown       Relevant Orders   Measles/Mumps/Rubella Immunity       Return in about 1 year (around 11/23/2018) for CPE.   The entirety of the information documented in the History of Present Illness, Review of Systems and Physical Exam were personally obtained by me. Portions of this information were initially documented by [place], Wendell and reviewed by me for thoroughness and accuracy.    Virginia Crews, MD, MPH [facility] 11/23/2017 9:14 AM

## 2017-11-30 DIAGNOSIS — Z0000 Encounter for general adult medical examination without abnormal findings: Secondary | ICD-10-CM | POA: Diagnosis not present

## 2017-11-30 DIAGNOSIS — Z125 Encounter for screening for malignant neoplasm of prostate: Secondary | ICD-10-CM | POA: Diagnosis not present

## 2017-11-30 DIAGNOSIS — E782 Mixed hyperlipidemia: Secondary | ICD-10-CM | POA: Diagnosis not present

## 2017-11-30 DIAGNOSIS — Z789 Other specified health status: Secondary | ICD-10-CM | POA: Diagnosis not present

## 2017-12-01 ENCOUNTER — Telehealth: Payer: Self-pay

## 2017-12-01 DIAGNOSIS — M152 Bouchard's nodes (with arthropathy): Secondary | ICD-10-CM | POA: Diagnosis not present

## 2017-12-01 LAB — COMPREHENSIVE METABOLIC PANEL
A/G RATIO: 1.7 (ref 1.2–2.2)
ALBUMIN: 4.5 g/dL (ref 3.6–4.8)
ALK PHOS: 48 IU/L (ref 39–117)
ALT: 21 IU/L (ref 0–44)
AST: 18 IU/L (ref 0–40)
BUN / CREAT RATIO: 11 (ref 10–24)
BUN: 10 mg/dL (ref 8–27)
Bilirubin Total: 0.5 mg/dL (ref 0.0–1.2)
CO2: 21 mmol/L (ref 20–29)
CREATININE: 0.91 mg/dL (ref 0.76–1.27)
Calcium: 9.5 mg/dL (ref 8.6–10.2)
Chloride: 105 mmol/L (ref 96–106)
GFR calc Af Amer: 100 mL/min/{1.73_m2} (ref >59)
GFR, EST NON AFRICAN AMERICAN: 87 mL/min/{1.73_m2} (ref >59)
GLOBULIN, TOTAL: 2.6 g/dL (ref 1.5–4.5)
Glucose: 94 mg/dL (ref 65–99)
Potassium: 4.3 mmol/L (ref 3.5–5.2)
SODIUM: 141 mmol/L (ref 134–144)
Total Protein: 7.1 g/dL (ref 6.0–8.5)

## 2017-12-01 LAB — LIPID PANEL
CHOLESTEROL TOTAL: 122 mg/dL (ref 100–199)
Chol/HDL Ratio: 2.7 ratio (ref 0.0–5.0)
HDL: 46 mg/dL (ref >39)
LDL Calculated: 63 mg/dL (ref 0–99)
TRIGLYCERIDES: 67 mg/dL (ref 0–149)
VLDL Cholesterol Cal: 13 mg/dL (ref 5–40)

## 2017-12-01 LAB — MEASLES/MUMPS/RUBELLA IMMUNITY
MUMPS ABS, IGG: >300 AU/mL (ref >10.9)
RUBEOLA AB, IGG: >300 AU/mL (ref >29.9)
Rubella Antibodies, IGG: 30.3 index (ref >0.99)

## 2017-12-01 LAB — CBC
Hematocrit: 42.5 % (ref 37.5–51.0)
Hemoglobin: 14.5 g/dL (ref 13.0–17.7)
MCH: 30.3 pg (ref 26.6–33.0)
MCHC: 34.1 g/dL (ref 31.5–35.7)
MCV: 89 fL (ref 79–97)
PLATELETS: 182 10*3/uL (ref 150–450)
RBC: 4.79 x10E6/uL (ref 4.14–5.80)
RDW: 13.9 % (ref 12.3–15.4)
WBC: 5.2 10*3/uL (ref 3.4–10.8)

## 2017-12-01 LAB — PSA TOTAL (REFLEX TO FREE): Prostate Specific Ag, Serum: 1 ng/mL (ref 0.0–4.0)

## 2017-12-01 NOTE — Telephone Encounter (Signed)
Viewed by Channing Mutters on 12/01/2017 1:53 PM

## 2017-12-01 NOTE — Telephone Encounter (Signed)
-----  Message from Virginia Crews, MD sent at 12/01/2017  9:55 AM EDT ----- Patient is immune to measles, mumps, and rubella.  He does not need vaccination with MMR at this time.  Cholesterol is well controlled.  Normal kidney function, liver function, electrolytes, blood counts.  PSA, prostate cancer screening number, is stable from last year and within normal limits.  Virginia Crews, MD, MPH [facility] 12/01/2017 9:55 AM

## 2017-12-23 ENCOUNTER — Encounter: Payer: Self-pay | Admitting: Family Medicine

## 2017-12-23 ENCOUNTER — Ambulatory Visit: Payer: BLUE CROSS/BLUE SHIELD | Admitting: Family Medicine

## 2017-12-23 VITALS — BP 132/82 | HR 76 | Temp 97.5°F | Resp 16 | Wt 207.8 lb

## 2017-12-23 DIAGNOSIS — J069 Acute upper respiratory infection, unspecified: Secondary | ICD-10-CM | POA: Diagnosis not present

## 2017-12-23 MED ORDER — HYDROCODONE-HOMATROPINE 5-1.5 MG/5ML PO SYRP: 5.0000 mL | ORAL_SOLUTION | Freq: Four times a day (QID) | ORAL | 0 refills | Status: AC | PRN

## 2017-12-23 MED ORDER — AMOXICILLIN-POT CLAVULANATE 875-125 MG PO TABS: 1.0000 | ORAL_TABLET | Freq: Two times a day (BID) | ORAL | 0 refills | Status: DC

## 2017-12-23 NOTE — Progress Notes (Signed)
sinus

## 2017-12-23 NOTE — Patient Instructions (Addendum)
Continue Mucinex D for congestion and Delsym for cough. Start the antibiotic if your sinuses are not improving early next week start the antibiotic.

## 2017-12-23 NOTE — Progress Notes (Signed)
  Subjective:     Patient ID: Ryan English, male   DOB: [birth year], 67 y.o.   MRN: [id number] Chief Complaint  Patient presents with  . Sinus Problem    Patient comes in office today with complaints of sinus pain, sore throat, post nasal drop, cough productive of green mucous . Patient states that he has been taking otc Mucinex D and Tylenol.    HPI Reports cold x onset 5 days ago. No fever or chills reported.  Review of Systems     Objective:   Physical Exam  Constitutional: He appears well-developed and well-nourished. No distress.  Ears: T.M's intact without inflammation Throat: no tonsillar enlargement or exudate Neck: no cervical adenopathy Lungs: clear     Assessment:    1. Viral upper respiratory tract infection: rx for hydrocodone cough syrup and Augmentin.    Plan:    Continue otc medication. Start abx if sinuses not improving over the next 2-3 days.

## 2018-01-21 ENCOUNTER — Other Ambulatory Visit: Payer: Self-pay | Admitting: Physician Assistant

## 2018-01-21 DIAGNOSIS — J3089 Other allergic rhinitis: Secondary | ICD-10-CM

## 2018-01-26 DIAGNOSIS — D2262 Melanocytic nevi of left upper limb, including shoulder: Secondary | ICD-10-CM | POA: Diagnosis not present

## 2018-01-26 DIAGNOSIS — X32XXXA Exposure to sunlight, initial encounter: Secondary | ICD-10-CM | POA: Diagnosis not present

## 2018-01-26 DIAGNOSIS — D2261 Melanocytic nevi of right upper limb, including shoulder: Secondary | ICD-10-CM | POA: Diagnosis not present

## 2018-01-26 DIAGNOSIS — L821 Other seborrheic keratosis: Secondary | ICD-10-CM | POA: Diagnosis not present

## 2018-01-26 DIAGNOSIS — D225 Melanocytic nevi of trunk: Secondary | ICD-10-CM | POA: Diagnosis not present

## 2018-01-26 DIAGNOSIS — L570 Actinic keratosis: Secondary | ICD-10-CM | POA: Diagnosis not present

## 2018-04-21 ENCOUNTER — Telehealth: Payer: Self-pay | Admitting: Family Medicine

## 2018-04-21 NOTE — Telephone Encounter (Signed)
Pt called saying he wants to know what he should take for acid reflux.  He was taking Prilosec was taken off that and put on Zantac max strength.  He read now that can cause Cancer.  He wants to know what he should be taking   [facility]  CB#  [phone]  Thanks [employer]

## 2018-04-21 NOTE — Telephone Encounter (Signed)
Switch to pepcid (famoditine) 20 mg. Once or twice daily.

## 2018-04-21 NOTE — Telephone Encounter (Signed)
[id number] 

## 2018-04-21 NOTE — Telephone Encounter (Signed)
Please advise. KW 

## 2018-04-24 NOTE — Telephone Encounter (Signed)
Patient advised.KW 

## 2018-04-26 DIAGNOSIS — Z23 Encounter for immunization: Secondary | ICD-10-CM | POA: Diagnosis not present

## 2018-07-04 DIAGNOSIS — Z01818 Encounter for other preprocedural examination: Secondary | ICD-10-CM | POA: Diagnosis not present

## 2018-07-04 DIAGNOSIS — I2510 Atherosclerotic heart disease of native coronary artery without angina pectoris: Secondary | ICD-10-CM | POA: Diagnosis not present

## 2018-07-04 DIAGNOSIS — E782 Mixed hyperlipidemia: Secondary | ICD-10-CM | POA: Diagnosis not present

## 2018-07-21 ENCOUNTER — Other Ambulatory Visit: Payer: Self-pay | Admitting: Physician Assistant

## 2018-07-21 DIAGNOSIS — M48061 Spinal stenosis, lumbar region without neurogenic claudication: Secondary | ICD-10-CM | POA: Diagnosis not present

## 2018-07-21 DIAGNOSIS — Z6830 Body mass index (BMI) 30.0-30.9, adult: Secondary | ICD-10-CM | POA: Diagnosis not present

## 2018-07-21 DIAGNOSIS — M4316 Spondylolisthesis, lumbar region: Secondary | ICD-10-CM | POA: Diagnosis not present

## 2018-07-21 DIAGNOSIS — M5126 Other intervertebral disc displacement, lumbar region: Secondary | ICD-10-CM | POA: Diagnosis not present

## 2018-07-21 DIAGNOSIS — J3089 Other allergic rhinitis: Secondary | ICD-10-CM

## 2018-07-21 DIAGNOSIS — M48062 Spinal stenosis, lumbar region with neurogenic claudication: Secondary | ICD-10-CM | POA: Diagnosis not present

## 2018-07-21 DIAGNOSIS — M5136 Other intervertebral disc degeneration, lumbar region: Secondary | ICD-10-CM | POA: Diagnosis not present

## 2018-07-24 ENCOUNTER — Other Ambulatory Visit: Payer: Self-pay | Admitting: Family Medicine

## 2018-07-24 DIAGNOSIS — J3089 Other allergic rhinitis: Secondary | ICD-10-CM

## 2018-07-24 MED ORDER — MONTELUKAST SODIUM 10 MG PO TABS: 10.0000 mg | ORAL_TABLET | Freq: Every day | ORAL | 3 refills | Status: DC

## 2018-07-24 NOTE — Telephone Encounter (Signed)
Please advise 

## 2018-07-24 NOTE — Telephone Encounter (Signed)
°  Pt needing a 90 day refill on: montelukast (SINGULAIR) 10 MG tablet  Please fill at:   [id number] [place] [place] - [address] [phone] (Phone) [phone] (Fax)   Thanks, [employer]

## 2018-07-25 MED ORDER — MONTELUKAST SODIUM 10 MG PO TABS: 10.0000 mg | ORAL_TABLET | Freq: Every day | ORAL | 3 refills | Status: DC

## 2018-07-25 NOTE — Addendum Note (Signed)
Addended by: Virginia Crews on: 07/25/2018 10:14 AM   Modules accepted: Orders

## 2018-07-27 DIAGNOSIS — M47816 Spondylosis without myelopathy or radiculopathy, lumbar region: Secondary | ICD-10-CM | POA: Diagnosis not present

## 2018-07-27 DIAGNOSIS — M48062 Spinal stenosis, lumbar region with neurogenic claudication: Secondary | ICD-10-CM | POA: Diagnosis not present

## 2018-07-27 DIAGNOSIS — M4316 Spondylolisthesis, lumbar region: Secondary | ICD-10-CM | POA: Diagnosis not present

## 2018-07-27 DIAGNOSIS — M48061 Spinal stenosis, lumbar region without neurogenic claudication: Secondary | ICD-10-CM | POA: Diagnosis not present

## 2018-08-01 ENCOUNTER — Ambulatory Visit (INDEPENDENT_AMBULATORY_CARE_PROVIDER_SITE_OTHER): Payer: BLUE CROSS/BLUE SHIELD | Admitting: Family Medicine

## 2018-08-01 ENCOUNTER — Encounter: Payer: Self-pay | Admitting: Family Medicine

## 2018-08-01 ENCOUNTER — Other Ambulatory Visit: Payer: Self-pay

## 2018-08-01 VITALS — BP 140/80 | HR 59 | Temp 97.7°F | Ht 71.5 in | Wt 212.0 lb

## 2018-08-01 DIAGNOSIS — J069 Acute upper respiratory infection, unspecified: Secondary | ICD-10-CM

## 2018-08-01 MED ORDER — HYDROCODONE-HOMATROPINE 5-1.5 MG/5ML PO SYRP: ORAL_SOLUTION | ORAL | 0 refills | Status: DC

## 2018-08-01 NOTE — Progress Notes (Signed)
  Subjective:     Patient ID: Ryan English, male   DOB: 07/19/1950, 68 y.o.   MRN: [id number] Chief Complaint  Patient presents with  . Cough    some fever on thursday 07/27/18, green mucus.  taking mucinex D, cough medication OTC.  hard to sleep from coughing   HPI Reports cold sx for the last week. Minimal sinus congestion and no fevers.   Review of Systems     Objective:   Physical Exam Constitutional:      General: He is not in acute distress.    Appearance: He is not ill-appearing.  Neurological:     Mental Status: He is alert.   Ears: T.M's intact without inflammation Throat: no tonsillar enlargement or exudate Neck: no cervical adenopathy Lungs: clear     Assessment:    1. URI, acute - HYDROcodone-homatropine (HYCODAN) 5-1.5 MG/5ML syrup; 5 ml 4-6 hours as needed for cough  Dispense: 100 mL; Refill: 0    Plan:    Continue expectorants. Call if cough not improving over the next week.,

## 2018-08-01 NOTE — Patient Instructions (Signed)
Continue expectorants like Mucinex or Robitussin.

## 2018-08-04 DIAGNOSIS — M4316 Spondylolisthesis, lumbar region: Secondary | ICD-10-CM | POA: Diagnosis not present

## 2018-08-04 DIAGNOSIS — Z6828 Body mass index (BMI) 28.0-28.9, adult: Secondary | ICD-10-CM | POA: Diagnosis not present

## 2018-08-04 DIAGNOSIS — M5416 Radiculopathy, lumbar region: Secondary | ICD-10-CM | POA: Diagnosis not present

## 2018-09-12 DIAGNOSIS — M48062 Spinal stenosis, lumbar region with neurogenic claudication: Secondary | ICD-10-CM | POA: Diagnosis not present

## 2018-09-12 DIAGNOSIS — Z79899 Other long term (current) drug therapy: Secondary | ICD-10-CM | POA: Diagnosis not present

## 2018-09-12 DIAGNOSIS — M5126 Other intervertebral disc displacement, lumbar region: Secondary | ICD-10-CM | POA: Diagnosis not present

## 2018-09-12 DIAGNOSIS — M79604 Pain in right leg: Secondary | ICD-10-CM | POA: Diagnosis not present

## 2018-09-12 DIAGNOSIS — E7800 Pure hypercholesterolemia, unspecified: Secondary | ICD-10-CM | POA: Diagnosis not present

## 2018-09-12 DIAGNOSIS — Z7982 Long term (current) use of aspirin: Secondary | ICD-10-CM | POA: Diagnosis not present

## 2018-09-12 DIAGNOSIS — M4316 Spondylolisthesis, lumbar region: Secondary | ICD-10-CM | POA: Diagnosis not present

## 2018-09-12 DIAGNOSIS — M4726 Other spondylosis with radiculopathy, lumbar region: Secondary | ICD-10-CM | POA: Diagnosis not present

## 2018-11-16 ENCOUNTER — Telehealth: Payer: Self-pay

## 2018-11-16 NOTE — Telephone Encounter (Signed)
Patient has a CPE scheduled on 11/23/2018. Contacted patient to R/S appointment since Dr. Brita Romp is only doing e-visits at this time. Patient requested a different provider do his CPE if possible. He does not want a PA only MD. Please advise.

## 2018-11-16 NOTE — Telephone Encounter (Signed)
I can do itthe first week of June.  Dr Rosanna Randy will be out of office and Caryn Section is likely to be busy.  There is nothing urgent about a CPE that cannot wait a few weeks.  If there is an acute issue, he can be seen for that

## 2018-11-17 NOTE — Telephone Encounter (Signed)
Patient rescheduled for CPE

## 2018-11-23 ENCOUNTER — Encounter: Payer: Self-pay | Admitting: Family Medicine

## 2018-12-04 ENCOUNTER — Telehealth: Payer: Self-pay

## 2018-12-04 NOTE — Telephone Encounter (Signed)
Patient scheduled for a e-visit on 12/05/2018

## 2018-12-04 NOTE — Telephone Encounter (Signed)
Pt called and advised that he pulled several ticks (small deer tick) off him this past weekend and not sure how long they were on him, it is like a rash there but not a bullseye.  He is asking if he needs to come in or what needs to be done.  Call back # [phone]

## 2018-12-04 NOTE — Telephone Encounter (Signed)
evisit is appropriate

## 2018-12-05 ENCOUNTER — Encounter: Payer: Self-pay | Admitting: Family Medicine

## 2018-12-05 ENCOUNTER — Ambulatory Visit (INDEPENDENT_AMBULATORY_CARE_PROVIDER_SITE_OTHER): Payer: BC Managed Care – PPO | Admitting: Family Medicine

## 2018-12-05 DIAGNOSIS — R21 Rash and other nonspecific skin eruption: Secondary | ICD-10-CM

## 2018-12-05 DIAGNOSIS — W57XXXA Bitten or stung by nonvenomous insect and other nonvenomous arthropods, initial encounter: Secondary | ICD-10-CM

## 2018-12-05 NOTE — Patient Instructions (Signed)

## 2018-12-05 NOTE — Progress Notes (Signed)
[name] Male    DOB: Apr 09, 1951   68 y.o.   MRN: [id number] Visit Date: 12/05/2018  Today's Provider: Lavon Paganini, MD   Chief Complaint  Patient presents with  . Tick Bite   Subjective:    Virtual Visit via Video Note  I connected with Ryan English on 12/05/18 at  8:20 AM EDT by a video enabled telemedicine application and verified that I am speaking with the correct person using two identifiers.   Patient location: home Provider location: [place] involved in the visit: patient, provider    I discussed the limitations of evaluation and management by telemedicine and the availability of in person appointments. The patient expressed understanding and agreed to proceed.  HPI   Tick Bite: Patient C/O two tick bites that he noticed on Saturday. He is unsure how long they were attached for.  Patient reports a rash but not a bullseye area. Area is raised and red, but improving.  Has put alcohol on them.  No drainage, no fever.  States that it was a small deer tick.  Quarter inch in size. On waist.  Allergies  Allergen Reactions  . Other      Current Outpatient Medications:  .  acetaminophen (TYLENOL) 500 MG tablet, Take 1,000 mg by mouth., Disp: , Rfl:  .  aspirin 81 MG tablet, Take 81 mg by mouth daily., Disp: , Rfl:  .  atorvastatin (LIPITOR) 40 MG tablet, Take 1 tablet by mouth daily., Disp: , Rfl:  .  loratadine (CLARITIN) 10 MG tablet, Take by mouth., Disp: , Rfl:  .  Misc Natural Products (OSTEO BI-FLEX JOINT SHIELD PO), Take 2 tablets by mouth daily., Disp: , Rfl:  .  montelukast (SINGULAIR) 10 MG tablet, Take 1 tablet (10 mg total) by mouth at bedtime., Disp: 90 tablet, Rfl: 3 .  Omega-3 Fatty Acids (FISH OIL) 1200 MG CAPS, Take 1 capsule by mouth daily. Reported on 08/20/2015, Disp: , Rfl:  .  ranitidine (ZANTAC) 150 MG tablet, Take by mouth., Disp: , Rfl:   Review of Systems  Constitutional: Negative.    Respiratory: Negative.   Cardiovascular: Negative.   Musculoskeletal: Negative.   Skin: Positive for rash.    Social History   Tobacco Use  . Smoking status: Never Smoker  . Smokeless tobacco: Never Used  Substance Use Topics  . Alcohol use: Yes    Alcohol/week: 6.0 standard drinks    Types: 6 Cans of beer per week    Comment: occasionally      Objective:   There were no vitals taken for this visit. There were no vitals filed for this visit.   Physical Exam Constitutional:      Appearance: Normal appearance.  Pulmonary:     Effort: Pulmonary effort is normal. No respiratory distress.  Skin:    Comments: 2 small areas of erythema on waist line  Neurological:     Mental Status: He is alert and oriented to person, place, and time. Mental status is at baseline.  Psychiatric:        Mood and Affect: Mood normal.        Behavior: Behavior normal.         Assessment & Plan    I discussed the assessment and treatment plan with the patient. The patient was provided an opportunity to ask questions and all were answered. The patient agreed with the plan and demonstrated an understanding of the instructions.  The patient was advised to call back or seek an in-person evaluation if the symptoms worsen or if the condition fails to improve as anticipated.   1. Rash 2. Tick bite, initial encounter - new problem - no signs of Lyme disease and RMSF - discussed local reaction and can apply hydrocortisone cream - discussed we do not live in Lyme endemic area and therefore do not treat with prophylactic doxycycline - discussed return precautions   Return if symptoms worsen or fail to improve.   The entirety of the information documented in the History of Present Illness, Review of Systems and Physical Exam were personally obtained by me. Portions of this information were initially documented by Tiburcio Pea, CMA and reviewed by me for thoroughness and accuracy.     Caoimhe Damron, Dionne Bucy, MD MPH [facility]

## 2019-01-15 ENCOUNTER — Ambulatory Visit (INDEPENDENT_AMBULATORY_CARE_PROVIDER_SITE_OTHER): Payer: BC Managed Care – PPO | Admitting: Family Medicine

## 2019-01-15 ENCOUNTER — Other Ambulatory Visit: Payer: Self-pay

## 2019-01-15 ENCOUNTER — Encounter: Payer: Self-pay | Admitting: Family Medicine

## 2019-01-15 VITALS — BP 135/83 | HR 63 | Temp 98.0°F | Resp 16 | Ht 72.0 in | Wt 211.0 lb

## 2019-01-15 DIAGNOSIS — K219 Gastro-esophageal reflux disease without esophagitis: Secondary | ICD-10-CM

## 2019-01-15 DIAGNOSIS — E663 Overweight: Secondary | ICD-10-CM | POA: Insufficient documentation

## 2019-01-15 DIAGNOSIS — Z125 Encounter for screening for malignant neoplasm of prostate: Secondary | ICD-10-CM | POA: Diagnosis not present

## 2019-01-15 DIAGNOSIS — Z0000 Encounter for general adult medical examination without abnormal findings: Secondary | ICD-10-CM

## 2019-01-15 DIAGNOSIS — I2510 Atherosclerotic heart disease of native coronary artery without angina pectoris: Secondary | ICD-10-CM

## 2019-01-15 DIAGNOSIS — E782 Mixed hyperlipidemia: Secondary | ICD-10-CM | POA: Diagnosis not present

## 2019-01-15 NOTE — Patient Instructions (Signed)
The CDC recommends two doses of Shingrix (the shingles vaccine) separated by 2 to 6 months for adults age 68 years and older. I recommend checking with your insurance plan regarding coverage for this vaccine.    Preventive Care 11 Years and Older, Male Preventive care refers to lifestyle choices and visits with your health care provider that can promote health and wellness. This includes:  A yearly physical exam. This is also called an annual well check.  Regular dental and eye exams.  Immunizations.  Screening for certain conditions.  Healthy lifestyle choices, such as diet and exercise. What can I expect for my preventive care visit? Physical exam Your health care provider will check:  Height and weight. These may be used to calculate body mass index (BMI), which is a measurement that tells if you are at a healthy weight.  Heart rate and blood pressure.  Your skin for abnormal spots. Counseling Your health care provider may ask you questions about:  Alcohol, tobacco, and drug use.  Emotional well-being.  Home and relationship well-being.  Sexual activity.  Eating habits.  History of falls.  Memory and ability to understand (cognition).  Work and work Statistician. What immunizations do I need?  Influenza (flu) vaccine  This is recommended every year. Tetanus, diphtheria, and pertussis (Tdap) vaccine  You may need a Td booster every 10 years. Varicella (chickenpox) vaccine  You may need this vaccine if you have not already been vaccinated. Zoster (shingles) vaccine  You may need this after age 20. Pneumococcal conjugate (PCV13) vaccine  One dose is recommended after age 59. Pneumococcal polysaccharide (PPSV23) vaccine  One dose is recommended after age 47. Measles, mumps, and rubella (MMR) vaccine  You may need at least one dose of MMR if you were born in 1957 or later. You may also need a second dose. Meningococcal conjugate (MenACWY) vaccine  You  may need this if you have certain conditions. Hepatitis A vaccine  You may need this if you have certain conditions or if you travel or work in places where you may be exposed to hepatitis A. Hepatitis B vaccine  You may need this if you have certain conditions or if you travel or work in places where you may be exposed to hepatitis B. Haemophilus influenzae type b (Hib) vaccine  You may need this if you have certain conditions. You may receive vaccines as individual doses or as more than one vaccine together in one shot (combination vaccines). Talk with your health care provider about the risks and benefits of combination vaccines. What tests do I need? Blood tests  Lipid and cholesterol levels. These may be checked every 5 years, or more frequently depending on your overall health.  Hepatitis C test.  Hepatitis B test. Screening  Lung cancer screening. You may have this screening every year starting at age 67 if you have a 30-pack-year history of smoking and currently smoke or have quit within the past 15 years.  Colorectal cancer screening. All adults should have this screening starting at age 48 and continuing until age 67. Your health care provider may recommend screening at age 60 if you are at increased risk. You will have tests every 1-10 years, depending on your results and the type of screening test.  Prostate cancer screening. Recommendations will vary depending on your family history and other risks.  Diabetes screening. This is done by checking your blood sugar (glucose) after you have not eaten for a while (fasting). You may have  this done every 1-3 years.  Abdominal aortic aneurysm (AAA) screening. You may need this if you are a current or former smoker.  Sexually transmitted disease (STD) testing. Follow these instructions at home: Eating and drinking  Eat a diet that includes fresh fruits and vegetables, whole grains, lean protein, and low-fat dairy products. Limit  your intake of foods with high amounts of sugar, saturated fats, and salt.  Take vitamin and mineral supplements as recommended by your health care provider.  Do not drink alcohol if your health care provider tells you not to drink.  If you drink alcohol: ? Limit how much you have to 0-2 drinks a day. ? Be aware of how much alcohol is in your drink. In the [place] one drink equals one 12 oz bottle of beer (355 mL), one 5 oz glass of wine (148 mL), or one 1 oz glass of hard liquor (44 mL). Lifestyle  Take daily care of your teeth and gums.  Stay active. Exercise for at least 30 minutes on 5 or more days each week.  Do not use any products that contain nicotine or tobacco, such as cigarettes, e-cigarettes, and chewing tobacco. If you need help quitting, ask your health care provider.  If you are sexually active, practice safe sex. Use a condom or other form of protection to prevent STIs (sexually transmitted infections).  Talk with your health care provider about taking a low-dose aspirin or statin. What's next?  Visit your health care provider once a year for a well check visit.  Ask your health care provider how often you should have your eyes and teeth checked.  Stay up to date on all vaccines. This information is not intended to replace advice given to you by your health care provider. Make sure you discuss any questions you have with your health care provider. Document Released: 07/18/2015 Document Revised: 06/15/2018 Document Reviewed: 06/15/2018 Elsevier Patient Education  2020 [employer].

## 2019-01-15 NOTE — Progress Notes (Signed)
[name], Male    DOB: Jun 08, 1951, 68 y.o.   MRN: [id number] Visit Date: 01/15/2019  Today's Provider: Lavon Paganini, MD   Chief Complaint  Patient presents with  . Annual Exam   Subjective:     Annual physical exam Ryan English is a 68 y.o. male who presents today for health maintenance and complete physical. He feels fairly well. He reports exercising yes/walking. He reports he is sleeping well.  -----------------------------------------------------------------   Review of Systems  Musculoskeletal: Positive for arthralgias and myalgias.  All other systems reviewed and are negative.   Social History      He  reports that he has never smoked. He has never used smokeless tobacco. He reports current alcohol use of about 6.0 standard drinks of alcohol per week. He reports that he does not use drugs.       Social History   Socioeconomic History  . Marital status: Married    Spouse name: Not on file  . Number of children: 2  . Years of education: Not on file  . Highest education level: Not on file  Occupational History  . Not on file  Social Needs  . Financial resource strain: Not on file  . Food insecurity    Worry: Not on file    Inability: Not on file  . Transportation needs    Medical: Not on file    Non-medical: Not on file  Tobacco Use  . Smoking status: Never Smoker  . Smokeless tobacco: Never Used  Substance and Sexual Activity  . Alcohol use: Yes    Alcohol/week: 6.0 standard drinks    Types: 6 Cans of beer per week    Comment: occasionally  . Drug use: No  . Sexual activity: Not on file  Lifestyle  . Physical activity    Days per week: Not on file    Minutes per session: Not on file  . Stress: Not on file  Relationships  . Social Herbalist on phone: Not on file    Gets together: Not on file    Attends religious service: Not on file    Active member of club or organization: Not on file    Attends  meetings of clubs or organizations: Not on file    Relationship status: Not on file  Other Topics Concern  . Not on file  Social History Narrative  . Not on file    Past Medical History:  Diagnosis Date  . Arthritis   . GERD (gastroesophageal reflux disease)   . Hyperlipidemia      Patient Active Problem List   Diagnosis Date Noted  . Overweight 01/15/2019  . Gastroesophageal reflux disease without esophagitis 06/24/2016  . Degenerative arthritis of spine 11/06/2015  . Neuralgia neuritis, sciatic nerve 11/06/2015  . Lumbar canal stenosis 11/06/2015  . SPL (spondylolisthesis) 11/06/2015  . Adrenal adenoma 05/26/2015  . Atherosclerosis of coronary artery 05/26/2015  . Allergic rhinitis 04/09/2015  . Bradycardia 08/02/2014  . Combined fat and carbohydrate induced hyperlipemia 08/02/2014    Past Surgical History:  Procedure Laterality Date  . [place] History        Family Status  Relation Name Status  . Mother  Deceased at age 32  . Father  Deceased at age 33's       MVA  . Sister  Deceased at age 45       DM  .  Brother  Alive  . PGM  Alive  . Sister  Alive  . Brother  Deceased at age 5       MI  . Brother  Alive  . Brother  Alive        His family history includes Cancer in his mother; Diabetes in his brother, brother, sister, and sister; Heart disease in his brother and brother.      Allergies  Allergen Reactions  . Other      Current Outpatient Medications:  .  acetaminophen (TYLENOL) 500 MG tablet, Take 1,000 mg by mouth., Disp: , Rfl:  .  aspirin 81 MG tablet, Take 81 mg by mouth daily., Disp: , Rfl:  .  atorvastatin (LIPITOR) 40 MG tablet, Take 1 tablet by mouth daily., Disp: , Rfl:  .  Famotidine (PEPCID PO), Take by mouth., Disp: , Rfl:  .  loratadine (CLARITIN) 10 MG tablet, Take by mouth., Disp: , Rfl:  .  Misc Natural Products (OSTEO BI-FLEX JOINT SHIELD PO), Take 2 tablets by mouth daily., Disp: , Rfl:  .  montelukast  (SINGULAIR) 10 MG tablet, Take 1 tablet (10 mg total) by mouth at bedtime., Disp: 90 tablet, Rfl: 3 .  Omega-3 Fatty Acids (FISH OIL) 1200 MG CAPS, Take 1 capsule by mouth daily. Reported on 08/20/2015, Disp: , Rfl:  .  ranitidine (ZANTAC) 150 MG tablet, Take by mouth., Disp: , Rfl:    Patient Care Team: Virginia Crews, MD as PCP - General (Family Medicine) Bary Castilla Forest Gleason, MD as Consulting Physician (General Surgery)    Objective:    Vitals: BP 135/83 (BP Location: Left Arm, Patient Position: Sitting, Cuff Size: Large)   Pulse 63   Temp 98 F (36.7 C) (Oral)   Resp 16   Ht 6' (1.829 m)   Wt 211 lb (95.7 kg)   SpO2 97%   BMI 28.62 kg/m    Vitals:   01/15/19 1339  BP: 135/83  Pulse: 63  Resp: 16  Temp: 98 F (36.7 C)  TempSrc: Oral  SpO2: 97%  Weight: 211 lb (95.7 kg)  Height: 6' (1.829 m)     Physical Exam Vitals signs reviewed.  Constitutional:      General: He is not in acute distress.    Appearance: Normal appearance. He is well-developed. He is not diaphoretic.  HENT:     Head: Normocephalic and atraumatic.     Right Ear: Tympanic membrane, ear canal and external ear normal.     Left Ear: Tympanic membrane, ear canal and external ear normal.  Eyes:     General: No scleral icterus.    Conjunctiva/sclera: Conjunctivae normal.     Pupils: Pupils are equal, round, and reactive to light.  Neck:     Musculoskeletal: Neck supple.     Thyroid: No thyromegaly.  Cardiovascular:     Rate and Rhythm: Normal rate and regular rhythm.     Pulses: Normal pulses.     Heart sounds: Normal heart sounds. No murmur.  Pulmonary:     Effort: Pulmonary effort is normal. No respiratory distress.     Breath sounds: Normal breath sounds. No wheezing or rales.  Abdominal:     General: There is no distension.     Palpations: Abdomen is soft.     Tenderness: There is no abdominal tenderness.  Musculoskeletal:        General: No deformity.     Right lower leg: No edema.      Left  lower leg: No edema.  Lymphadenopathy:     Cervical: No cervical adenopathy.  Skin:    General: Skin is warm and dry.     Capillary Refill: Capillary refill takes less than 2 seconds.     Findings: No rash.  Neurological:     Mental Status: He is alert and oriented to person, place, and time. Mental status is at baseline.  Psychiatric:        Mood and Affect: Mood normal.        Behavior: Behavior normal.        Thought Content: Thought content normal.      Depression Screen PHQ 2/9 Scores 01/15/2019 11/22/2017 11/17/2016 11/06/2015  PHQ - 2 Score 0 0 0 0  PHQ- 9 Score 0 0 - -    Cognitive Testing - 6-CIT  Correct? Score   What year is it? yes 0 0 or 4  What month is it? yes 0 0 or 3  Memorize:    Pia Mau,  42,  [address]  [place],      What time is it? (within 1 hour) yes 0 0 or 3  Count backwards from 20 yes 0 0, 2, or 4  Name the months of the year yes 0 0, 2, or 4  Repeat name & address above yes 0 0, 2, 4, 6, 8, or 10       TOTAL SCORE  0/28   Interpretation:  Normal  Normal (0-7) Abnormal (8-28)     Assessment & Plan:     Routine Health Maintenance and Physical Exam  Exercise Activities and Dietary recommendations Goals   None     Immunization History  Administered Date(s) Administered  . Influenza, High Dose Seasonal PF 06/07/2017, 04/26/2018  . Influenza,inj,Quad PF,6+ Mos 04/05/2015  . Pneumococcal Conjugate-13 11/17/2016  . Pneumococcal Polysaccharide-23 11/22/2017  . Td 05/17/2007  . Tdap 05/17/2007, 09/23/2017  . Zoster 08/21/2012    Health Maintenance  Topic Date Due  . INFLUENZA VACCINE  02/03/2019  . COLONOSCOPY  09/08/2020  . TETANUS/TDAP  09/24/2027  . Hepatitis C Screening  Completed  . PNA vac Low Risk Adult  Completed     Discussed health benefits of physical activity, and encouraged him to engage in regular exercise appropriate for his age and condition.     --------------------------------------------------------------------  Problem List Items Addressed This Visit      Cardiovascular and Mediastinum   Atherosclerosis of coronary artery     Digestive   Gastroesophageal reflux disease without esophagitis   Relevant Medications   Famotidine (PEPCID PO)   Other Relevant Orders   CBC w/Diff/Platelet     Other   Combined fat and carbohydrate induced hyperlipemia   Relevant Orders   Lipid panel   Comprehensive metabolic panel   Overweight    Other Visit Diagnoses    Encounter for annual physical exam    -  Primary   Relevant Orders   Lipid panel   Comprehensive metabolic panel   CBC w/Diff/Platelet   PSA Total (Reflex To Free)   Screening for prostate cancer       Relevant Orders   PSA Total (Reflex To Free)       Return in about 1 year (around 01/15/2020) for CPE.   The entirety of the information documented in the History of Present Illness, Review of Systems and Physical Exam were personally obtained by me. Portions of this information were initially documented by Ryan English, CMA and reviewed  by me for thoroughness and accuracy.    Aiden Rao, Dionne Bucy, MD MPH [facility]

## 2019-01-25 DIAGNOSIS — X32XXXA Exposure to sunlight, initial encounter: Secondary | ICD-10-CM | POA: Diagnosis not present

## 2019-01-25 DIAGNOSIS — L570 Actinic keratosis: Secondary | ICD-10-CM | POA: Diagnosis not present

## 2019-01-25 DIAGNOSIS — L820 Inflamed seborrheic keratosis: Secondary | ICD-10-CM | POA: Diagnosis not present

## 2019-01-25 DIAGNOSIS — L538 Other specified erythematous conditions: Secondary | ICD-10-CM | POA: Diagnosis not present

## 2019-02-07 DIAGNOSIS — E782 Mixed hyperlipidemia: Secondary | ICD-10-CM | POA: Diagnosis not present

## 2019-02-07 DIAGNOSIS — Z0000 Encounter for general adult medical examination without abnormal findings: Secondary | ICD-10-CM | POA: Diagnosis not present

## 2019-02-07 DIAGNOSIS — Z125 Encounter for screening for malignant neoplasm of prostate: Secondary | ICD-10-CM | POA: Diagnosis not present

## 2019-02-07 DIAGNOSIS — K219 Gastro-esophageal reflux disease without esophagitis: Secondary | ICD-10-CM | POA: Diagnosis not present

## 2019-02-08 LAB — CBC WITH DIFFERENTIAL/PLATELET
Basophils Absolute: 0.1 10*3/uL (ref 0.0–0.2)
Basos: 1 %
EOS (ABSOLUTE): 0.2 10*3/uL (ref 0.0–0.4)
Eos: 4 %
Hematocrit: 43.2 % (ref 37.5–51.0)
Hemoglobin: 14.8 g/dL (ref 13.0–17.7)
Immature Grans (Abs): 0 10*3/uL (ref 0.0–0.1)
Immature Granulocytes: 0 %
Lymphocytes Absolute: 1.9 10*3/uL (ref 0.7–3.1)
Lymphs: 41 %
MCH: 30.6 pg (ref 26.6–33.0)
MCHC: 34.3 g/dL (ref 31.5–35.7)
MCV: 89 fL (ref 79–97)
Monocytes Absolute: 0.5 10*3/uL (ref 0.1–0.9)
Monocytes: 10 %
Neutrophils Absolute: 2 10*3/uL (ref 1.4–7.0)
Neutrophils: 44 %
Platelets: 157 10*3/uL (ref 150–450)
RBC: 4.83 x10E6/uL (ref 4.14–5.80)
RDW: 13.1 % (ref 11.6–15.4)
WBC: 4.7 10*3/uL (ref 3.4–10.8)

## 2019-02-08 LAB — COMPREHENSIVE METABOLIC PANEL
ALT: 18 IU/L (ref 0–44)
AST: 16 IU/L (ref 0–40)
Albumin/Globulin Ratio: 2 (ref 1.2–2.2)
Albumin: 4.4 g/dL (ref 3.8–4.8)
Alkaline Phosphatase: 41 IU/L (ref 39–117)
BUN/Creatinine Ratio: 14 (ref 10–24)
BUN: 13 mg/dL (ref 8–27)
Bilirubin Total: 0.6 mg/dL (ref 0.0–1.2)
CO2: 23 mmol/L (ref 20–29)
Calcium: 9.7 mg/dL (ref 8.6–10.2)
Chloride: 103 mmol/L (ref 96–106)
Creatinine, Ser: 0.92 mg/dL (ref 0.76–1.27)
GFR calc Af Amer: 98 mL/min/{1.73_m2} (ref >59)
GFR calc non Af Amer: 85 mL/min/{1.73_m2} (ref >59)
Globulin, Total: 2.2 g/dL (ref 1.5–4.5)
Glucose: 102 mg/dL — ABNORMAL HIGH (ref 65–99)
Potassium: 4.2 mmol/L (ref 3.5–5.2)
Sodium: 141 mmol/L (ref 134–144)
Total Protein: 6.6 g/dL (ref 6.0–8.5)

## 2019-02-08 LAB — LIPID PANEL
Chol/HDL Ratio: 2.7 ratio (ref 0.0–5.0)
Cholesterol, Total: 131 mg/dL (ref 100–199)
HDL: 48 mg/dL (ref >39)
LDL Calculated: 69 mg/dL (ref 0–99)
Triglycerides: 72 mg/dL (ref 0–149)
VLDL Cholesterol Cal: 14 mg/dL (ref 5–40)

## 2019-02-08 LAB — PSA TOTAL (REFLEX TO FREE): Prostate Specific Ag, Serum: 0.8 ng/mL (ref 0.0–4.0)

## 2019-02-09 ENCOUNTER — Telehealth: Payer: Self-pay

## 2019-02-09 NOTE — Telephone Encounter (Signed)
-----   Message from Virginia Crews, MD sent at 02/08/2019  3:43 PM EDT ----- Normal labs

## 2019-02-09 NOTE — Telephone Encounter (Signed)
Lab results given to patient's spouse.

## 2019-02-13 DIAGNOSIS — M172 Bilateral post-traumatic osteoarthritis of knee: Secondary | ICD-10-CM | POA: Diagnosis not present

## 2019-02-13 DIAGNOSIS — M25561 Pain in right knee: Secondary | ICD-10-CM | POA: Diagnosis not present

## 2019-02-13 DIAGNOSIS — M25562 Pain in left knee: Secondary | ICD-10-CM | POA: Diagnosis not present

## 2019-04-23 DIAGNOSIS — Z01818 Encounter for other preprocedural examination: Secondary | ICD-10-CM | POA: Diagnosis not present

## 2019-04-23 DIAGNOSIS — M4316 Spondylolisthesis, lumbar region: Secondary | ICD-10-CM | POA: Diagnosis not present

## 2019-06-26 DIAGNOSIS — I2510 Atherosclerotic heart disease of native coronary artery without angina pectoris: Secondary | ICD-10-CM | POA: Diagnosis not present

## 2019-06-26 DIAGNOSIS — E782 Mixed hyperlipidemia: Secondary | ICD-10-CM | POA: Diagnosis not present

## 2019-07-25 ENCOUNTER — Ambulatory Visit: Payer: Self-pay | Attending: Internal Medicine

## 2019-07-25 DIAGNOSIS — Z23 Encounter for immunization: Secondary | ICD-10-CM | POA: Insufficient documentation

## 2019-08-12 ENCOUNTER — Ambulatory Visit: Payer: Self-pay

## 2019-08-15 ENCOUNTER — Ambulatory Visit: Payer: Self-pay | Attending: Internal Medicine

## 2019-08-15 DIAGNOSIS — Z23 Encounter for immunization: Secondary | ICD-10-CM | POA: Insufficient documentation

## 2019-08-15 NOTE — Progress Notes (Signed)
   Covid-19 Vaccination Clinic  Name:  Ryan English    MRN: [id number] DOB: 05-23-1951  08/15/2019  [name] was observed post Covid-19 immunization for 15 minutes without incidence. He was provided with Vaccine Information Sheet and instruction to access the V-Safe system.   [name] was instructed to call 911 with any severe reactions post vaccine: Marland Kitchen Difficulty breathing  . Swelling of your face and throat  . A fast heartbeat  . A bad rash all over your body  . Dizziness and weakness    Immunizations Administered    Name Date Dose VIS Date Route   Pfizer COVID-19 Vaccine 08/15/2019  8:09 AM 0.3 mL 06/15/2019 Intramuscular   Manufacturer: [place]   Lot: [id number]   [place]: [id number]

## 2019-10-22 ENCOUNTER — Other Ambulatory Visit: Payer: Self-pay | Admitting: Family Medicine

## 2019-10-22 DIAGNOSIS — J3089 Other allergic rhinitis: Secondary | ICD-10-CM

## 2019-10-22 NOTE — Telephone Encounter (Signed)
Requested Prescriptions  Pending Prescriptions Disp Refills  . montelukast (SINGULAIR) 10 MG tablet [Pharmacy Med Name: MONTELUKAST SOD 10 MG TABLET] 90 tablet 0    Sig: TAKE 1 TABLET BY MOUTH EVERYDAY AT BEDTIME     Pulmonology:  Leukotriene Inhibitors Passed - 10/22/2019 10:14 PM      Passed - Valid encounter within last 12 months    Recent Outpatient Visits          9 months ago Encounter for annual physical exam   [facility], Dionne Bucy, MD   10 months ago [place] [facility], Dionne Bucy, MD   1 year ago URI, acute   [place], [place]   1 year ago Viral upper respiratory tract infection   [place], [place]   1 year ago Encounter for annual physical exam   [facility], Dionne Bucy, MD

## 2020-01-16 ENCOUNTER — Encounter: Payer: Self-pay | Admitting: Family Medicine

## 2020-01-26 ENCOUNTER — Other Ambulatory Visit: Payer: Self-pay | Admitting: Family Medicine

## 2020-01-26 DIAGNOSIS — J3089 Other allergic rhinitis: Secondary | ICD-10-CM

## 2020-01-26 NOTE — Telephone Encounter (Signed)
90 day courtesy RF Requested Prescriptions  Pending Prescriptions Disp Refills  . montelukast (SINGULAIR) 10 MG tablet [Pharmacy Med Name: MONTELUKAST SOD 10 MG TABLET] 90 tablet 0    Sig: TAKE 1 TABLET BY MOUTH EVERYDAY AT BEDTIME     Pulmonology:  Leukotriene Inhibitors Failed - 01/26/2020  9:19 AM      Failed - Valid encounter within last 12 months    Recent Outpatient Visits          1 year ago Encounter for annual physical exam   [facility], Dionne Bucy, MD   1 year ago [place], Dionne Bucy, MD   1 year ago URI, acute   [place], [place]   2 years ago Viral upper respiratory tract infection   [place], [place], [place]   2 years ago Encounter for annual physical exam   [facility], Dionne Bucy, MD      Future Appointments            In 6 days Marlyn Corporal, Clearnce Sorrel, PA-C [employer], [place]

## 2020-01-28 DIAGNOSIS — L570 Actinic keratosis: Secondary | ICD-10-CM | POA: Diagnosis not present

## 2020-01-28 DIAGNOSIS — C44529 Squamous cell carcinoma of skin of other part of trunk: Secondary | ICD-10-CM | POA: Diagnosis not present

## 2020-01-28 DIAGNOSIS — D2271 Melanocytic nevi of right lower limb, including hip: Secondary | ICD-10-CM | POA: Diagnosis not present

## 2020-01-28 DIAGNOSIS — D2272 Melanocytic nevi of left lower limb, including hip: Secondary | ICD-10-CM | POA: Diagnosis not present

## 2020-01-28 DIAGNOSIS — D2262 Melanocytic nevi of left upper limb, including shoulder: Secondary | ICD-10-CM | POA: Diagnosis not present

## 2020-01-28 DIAGNOSIS — D0439 Carcinoma in situ of skin of other parts of face: Secondary | ICD-10-CM | POA: Diagnosis not present

## 2020-01-28 DIAGNOSIS — D485 Neoplasm of uncertain behavior of skin: Secondary | ICD-10-CM | POA: Diagnosis not present

## 2020-01-28 DIAGNOSIS — L821 Other seborrheic keratosis: Secondary | ICD-10-CM | POA: Diagnosis not present

## 2020-01-28 DIAGNOSIS — D225 Melanocytic nevi of trunk: Secondary | ICD-10-CM | POA: Diagnosis not present

## 2020-01-28 DIAGNOSIS — B078 Other viral warts: Secondary | ICD-10-CM | POA: Diagnosis not present

## 2020-01-28 DIAGNOSIS — D2261 Melanocytic nevi of right upper limb, including shoulder: Secondary | ICD-10-CM | POA: Diagnosis not present

## 2020-01-28 DIAGNOSIS — R238 Other skin changes: Secondary | ICD-10-CM | POA: Diagnosis not present

## 2020-01-31 NOTE — Progress Notes (Signed)
Complete physical exam   [name]   DOB: 1950/09/22   69 y.o. Male  MRN: [id number] Visit Date: 02/01/2020  Today's healthcare provider: Mar Daring, PA-C   Chief Complaint  Patient presents with  . Annual Exam   Subjective    Ryan English is a 69 y.o. male who presents today for a complete physical exam.  He reports consuming a general diet. Home exercise routine includes walking and physical work. He generally feels well. He reports sleeping well. He does not have additional problems to discuss today.  HPI    Past Medical History:  Diagnosis Date  . Arthritis   . GERD (gastroesophageal reflux disease)   . Hyperlipidemia    Past Surgical History:  Procedure Laterality Date  . APPENDECTOMY  1999   Social History   Socioeconomic History  . Marital status: Married    Spouse name: Not on file  . Number of children: 2  . Years of education: Not on file  . Highest education level: Not on file  Occupational History  . Not on file  Tobacco Use  . Smoking status: Never Smoker  . Smokeless tobacco: Never Used  Vaping Use  . Vaping Use: Never used  Substance and Sexual Activity  . Alcohol use: Yes    Alcohol/week: 6.0 standard drinks    Types: 6 Cans of beer per week    Comment: occasionally  . Drug use: No  . Sexual activity: Not on file  Other Topics Concern  . Not on file  Social History Narrative  . Not on file   Social Determinants of Health   Financial Resource Strain:   . Difficulty of Paying Living Expenses:   Food Insecurity:   . Worried About Charity fundraiser in the Last Year:   . Arboriculturist in the Last Year:   Transportation Needs:   . Film/video editor (Medical):   Marland Kitchen Lack of Transportation (Non-Medical):   Physical Activity:   . Days of Exercise per Week:   . Minutes of Exercise per Session:   Stress:   . Feeling of Stress :   Social Connections:   . Frequency of Communication with Friends and  Family:   . Frequency of Social Gatherings with Friends and Family:   . Attends Religious Services:   . Active Member of Clubs or Organizations:   . Attends Archivist Meetings:   Marland Kitchen Marital Status:   Intimate Partner Violence:   . Fear of Current or Ex-Partner:   . Emotionally Abused:   Marland Kitchen Physically Abused:   . Sexually Abused:    Family Status  Relation Name Status  . Mother  Deceased at age 33  . Father  Deceased at age 49's       MVA  . Sister  Deceased at age 92       DM  . Brother  Alive  . PGM  Alive  . Sister  Alive  . Brother  Deceased at age 66       MI  . Brother  Alive  . Brother  Alive   Family History  Problem Relation Age of Onset  . Cancer Mother   . Diabetes Sister   . Heart disease Brother        MI  . Diabetes Sister   . Heart disease Brother   . Diabetes Brother   . Diabetes Brother    Allergies  Allergen Reactions  . Other     Patient Care Team: Virginia Crews, MD as PCP - General (Family Medicine) Bary Castilla Forest Gleason, MD as Consulting Physician (General Surgery)   Medications: Outpatient Medications Prior to Visit  Medication Sig  . acetaminophen (TYLENOL) 500 MG tablet Take 1,000 mg by mouth.  Marland Kitchen aspirin 81 MG tablet Take 81 mg by mouth daily.  Marland Kitchen atorvastatin (LIPITOR) 40 MG tablet Take 1 tablet by mouth daily.  Mariane Baumgarten Calcium (STOOL SOFTENER PO) Take by mouth.  . Famotidine (PEPCID PO) Take by mouth.  . loratadine (CLARITIN) 10 MG tablet Take by mouth.  . Misc Natural Products (OSTEO BI-FLEX JOINT SHIELD PO) Take 2 tablets by mouth daily.  . montelukast (SINGULAIR) 10 MG tablet TAKE 1 TABLET BY MOUTH EVERYDAY AT BEDTIME  . Omega-3 Fatty Acids (FISH OIL) 1200 MG CAPS Take 1 capsule by mouth daily. Reported on 08/20/2015  . ranitidine (ZANTAC) 150 MG tablet Take by mouth.   No facility-administered medications prior to visit.    Review of Systems  Constitutional: Negative.   HENT: Negative.   Eyes: Negative.     Respiratory: Negative.   Cardiovascular: Negative.   Gastrointestinal: Negative.   Endocrine: Negative.   Genitourinary: Negative.   Musculoskeletal: Negative.   Skin: Negative.   Allergic/Immunologic: Negative.   Neurological: Negative.   Hematological: Negative.   Psychiatric/Behavioral: Negative.     Last CBC Lab Results  Component Value Date   WBC 5.2 02/01/2020   HGB 15.0 02/01/2020   HCT 43.0 02/01/2020   MCV 87 02/01/2020   MCH 30.3 02/01/2020   RDW 13.0 02/01/2020   PLT 176 95/63/8756   Last metabolic panel Lab Results  Component Value Date   GLUCOSE 119 (H) 02/01/2020   NA 138 02/01/2020   K 4.1 02/01/2020   CL 101 02/01/2020   CO2 22 02/01/2020   BUN 11 02/01/2020   CREATININE 1.01 02/01/2020   GFRNONAA 76 02/01/2020   GFRAA 87 02/01/2020   CALCIUM 9.7 02/01/2020   PHOS 3.3 12/02/2016   PROT 7.3 02/01/2020   ALBUMIN 4.7 02/01/2020   LABGLOB 2.6 02/01/2020   AGRATIO 1.8 02/01/2020   BILITOT 0.8 02/01/2020   ALKPHOS 53 02/01/2020   AST 17 02/01/2020   ALT 21 02/01/2020   ANIONGAP 7 06/06/2015      Objective    BP 120/79 (BP Location: Left Arm, Patient Position: Sitting, Cuff Size: Large)   Pulse 64   Temp 98.4 F (36.9 C) (Oral)   Resp 16   Ht 5' 11.5" (1.816 m)   Wt (!) 206 lb 3.2 oz (93.5 kg)   BMI 28.36 kg/m  BP Readings from Last 3 Encounters:  02/01/20 120/79  01/15/19 135/83  08/01/18 140/80   Wt Readings from Last 3 Encounters:  02/01/20 (!) 206 lb 3.2 oz (93.5 kg)  01/15/19 211 lb (95.7 kg)  08/01/18 212 lb (96.2 kg)      Physical Exam Vitals reviewed.  Constitutional:      Appearance: Normal appearance. He is well-developed, well-groomed and overweight.  HENT:     Head: Normocephalic and atraumatic.     Right Ear: Tympanic membrane, ear canal and external ear normal.     Left Ear: Tympanic membrane, ear canal and external ear normal.     Nose: Nose normal.     Mouth/Throat:     Mouth: Mucous membranes are moist.      Pharynx: Oropharynx is clear.  Eyes:  General: No scleral icterus.       Right eye: No discharge.        Left eye: No discharge.     Extraocular Movements: Extraocular movements intact.     Conjunctiva/sclera: Conjunctivae normal.     Pupils: Pupils are equal, round, and reactive to light.  Neck:     Thyroid: No thyromegaly.     Vascular: No carotid bruit.     Trachea: No tracheal deviation.  Cardiovascular:     Rate and Rhythm: Normal rate and regular rhythm.     Pulses: Normal pulses.     Heart sounds: Normal heart sounds. No murmur heard.   Pulmonary:     Effort: Pulmonary effort is normal. No respiratory distress.     Breath sounds: Normal breath sounds. No wheezing or rales.  Chest:     Chest wall: No tenderness.  Abdominal:     General: Abdomen is flat. Bowel sounds are normal. There is no distension.     Palpations: Abdomen is soft. There is no mass.     Tenderness: There is no abdominal tenderness. There is no guarding or rebound.  Musculoskeletal:        General: No tenderness. Normal range of motion.     Cervical back: Normal range of motion and neck supple.     Right lower leg: No edema.     Left lower leg: No edema.  Lymphadenopathy:     Cervical: No cervical adenopathy.  Skin:    General: Skin is warm and dry.     Capillary Refill: Capillary refill takes less than 2 seconds.     Findings: No erythema or rash.  Neurological:     General: No focal deficit present.     Mental Status: He is alert and oriented to person, place, and time. Mental status is at baseline.     Cranial Nerves: No cranial nerve deficit.     Motor: No abnormal muscle tone.     Coordination: Coordination normal.     Deep Tendon Reflexes: Reflexes are normal and symmetric. Reflexes normal.  Psychiatric:        Mood and Affect: Mood normal.        Behavior: Behavior normal. Behavior is cooperative.        Thought Content: Thought content normal.        Judgment: Judgment normal.      Last depression screening scores PHQ 2/9 Scores 02/01/2020 01/15/2019 11/22/2017  PHQ - 2 Score 0 0 0  PHQ- 9 Score - 0 0   Last fall risk screening Fall Risk  02/01/2020  Falls in the past year? 0  Number falls in past yr: 0  Injury with Fall? 0  Risk for fall due to : No Fall Risks  Follow up Falls evaluation completed   Last Audit-C alcohol use screening Alcohol Use Disorder Test (AUDIT) 01/15/2019  1. How often do you have a drink containing alcohol? 3  2. How many drinks containing alcohol do you have on a typical day when you are drinking? 0  3. How often do you have six or more drinks on one occasion? 0  AUDIT-C Score 3   A score of 3 or more in women, and 4 or more in men indicates increased risk for alcohol abuse, EXCEPT if all of the points are from question 1   No results found for any visits on 02/01/20.  Assessment & Plan    Routine Health Maintenance and Physical Exam  Exercise Activities and Dietary recommendations Goals   None     Immunization History  Administered Date(s) Administered  . Influenza, High Dose Seasonal PF 06/07/2017, 04/26/2018  . Influenza,inj,Quad PF,6+ Mos 04/05/2015  . Influenza-Unspecified 04/13/2019  . PFIZER SARS-COV-2 Vaccination 07/25/2019, 08/15/2019  . Pneumococcal Conjugate-13 11/17/2016  . Pneumococcal Polysaccharide-23 11/22/2017  . Td 05/17/2007  . Tdap 05/17/2007, 09/23/2017  . Zoster 08/21/2012  . Zoster Recombinat (Shingrix) 05/18/2019, 10/03/2019    Health Maintenance  Topic Date Due  . INFLUENZA VACCINE  02/03/2020  . COLONOSCOPY  09/08/2020  . TETANUS/TDAP  09/24/2027  . COVID-19 Vaccine  Completed  . Hepatitis C Screening  Completed  . PNA vac Low Risk Adult  Completed    Discussed health benefits of physical activity, and encouraged him to engage in regular exercise appropriate for his age and condition.  1. Annual physical exam Normal physical exam today. Will check labs as below and f/u pending lab  results. If labs are stable and WNL he will not need to have these rechecked for one year at his next annual physical exam. He is to call the office in the meantime if he has any acute issue, questions or concerns. - CBC with Differential/Platelet - Comprehensive metabolic panel - Lipid panel  2. Combined fat and carbohydrate induced hyperlipemia Stable. Continue atorvastatin 40mg . Will check labs as below and f/u pending results. - CBC with Differential/Platelet - Comprehensive metabolic panel - Lipid panel  3. BMI 28.0-28.9,adult Counseled patient on healthy lifestyle modifications including dieting and exercise.  - CBC with Differential/Platelet - Comprehensive metabolic panel - Lipid panel  4. Prostate cancer screening Will check labs as below and f/u pending results. - PSA Total (Reflex To Free)  5. Seasonal allergic rhinitis due to pollen Stable. Diagnosis pulled for medication refill. Continue current medical treatment plan. - fluticasone (FLONASE) 50 MCG/ACT nasal spray; Place 2 sprays into both nostrils daily.  Dispense: 16 g; Refill: 6   No follow-ups on file.     Reynolds Bowl, PA-C, have reviewed all documentation for this visit. The documentation on 02/06/20 for the exam, diagnosis, procedures, and orders are all accurate and complete.   Rubye Beach  [facility] [phone] (phone) [phone] (fax)  [place]

## 2020-02-01 ENCOUNTER — Other Ambulatory Visit: Payer: Self-pay

## 2020-02-01 ENCOUNTER — Encounter: Payer: Self-pay | Admitting: Physician Assistant

## 2020-02-01 ENCOUNTER — Ambulatory Visit (INDEPENDENT_AMBULATORY_CARE_PROVIDER_SITE_OTHER): Payer: Medicare HMO | Admitting: Physician Assistant

## 2020-02-01 VITALS — BP 120/79 | HR 64 | Temp 98.4°F | Resp 16 | Ht 71.5 in | Wt 206.2 lb

## 2020-02-01 DIAGNOSIS — J301 Allergic rhinitis due to pollen: Secondary | ICD-10-CM | POA: Diagnosis not present

## 2020-02-01 DIAGNOSIS — Z0000 Encounter for general adult medical examination without abnormal findings: Secondary | ICD-10-CM | POA: Diagnosis not present

## 2020-02-01 DIAGNOSIS — Z125 Encounter for screening for malignant neoplasm of prostate: Secondary | ICD-10-CM

## 2020-02-01 DIAGNOSIS — Z6828 Body mass index (BMI) 28.0-28.9, adult: Secondary | ICD-10-CM

## 2020-02-01 DIAGNOSIS — E782 Mixed hyperlipidemia: Secondary | ICD-10-CM

## 2020-02-01 MED ORDER — FLUTICASONE PROPIONATE 50 MCG/ACT NA SUSP: 2.0000 | Freq: Every day | NASAL | 6 refills | Status: DC

## 2020-02-01 NOTE — Patient Instructions (Signed)

## 2020-02-02 LAB — CBC WITH DIFFERENTIAL/PLATELET
Basophils Absolute: 0.1 10*3/uL (ref 0.0–0.2)
Basos: 1 %
EOS (ABSOLUTE): 0.1 10*3/uL (ref 0.0–0.4)
Eos: 2 %
Hematocrit: 43 % (ref 37.5–51.0)
Hemoglobin: 15 g/dL (ref 13.0–17.7)
Immature Grans (Abs): 0 10*3/uL (ref 0.0–0.1)
Immature Granulocytes: 0 %
Lymphocytes Absolute: 1.8 10*3/uL (ref 0.7–3.1)
Lymphs: 35 %
MCH: 30.3 pg (ref 26.6–33.0)
MCHC: 34.9 g/dL (ref 31.5–35.7)
MCV: 87 fL (ref 79–97)
Monocytes Absolute: 0.5 10*3/uL (ref 0.1–0.9)
Monocytes: 10 %
Neutrophils Absolute: 2.7 10*3/uL (ref 1.4–7.0)
Neutrophils: 52 %
Platelets: 176 10*3/uL (ref 150–450)
RBC: 4.95 x10E6/uL (ref 4.14–5.80)
RDW: 13 % (ref 11.6–15.4)
WBC: 5.2 10*3/uL (ref 3.4–10.8)

## 2020-02-02 LAB — COMPREHENSIVE METABOLIC PANEL
ALT: 21 IU/L (ref 0–44)
AST: 17 IU/L (ref 0–40)
Albumin/Globulin Ratio: 1.8 (ref 1.2–2.2)
Albumin: 4.7 g/dL (ref 3.8–4.8)
Alkaline Phosphatase: 53 IU/L (ref 48–121)
BUN/Creatinine Ratio: 11 (ref 10–24)
BUN: 11 mg/dL (ref 8–27)
Bilirubin Total: 0.8 mg/dL (ref 0.0–1.2)
CO2: 22 mmol/L (ref 20–29)
Calcium: 9.7 mg/dL (ref 8.6–10.2)
Chloride: 101 mmol/L (ref 96–106)
Creatinine, Ser: 1.01 mg/dL (ref 0.76–1.27)
GFR calc Af Amer: 87 mL/min/{1.73_m2} (ref >59)
GFR calc non Af Amer: 76 mL/min/{1.73_m2} (ref >59)
Globulin, Total: 2.6 g/dL (ref 1.5–4.5)
Glucose: 119 mg/dL — ABNORMAL HIGH (ref 65–99)
Potassium: 4.1 mmol/L (ref 3.5–5.2)
Sodium: 138 mmol/L (ref 134–144)
Total Protein: 7.3 g/dL (ref 6.0–8.5)

## 2020-02-02 LAB — LIPID PANEL
Chol/HDL Ratio: 2.7 ratio (ref 0.0–5.0)
Cholesterol, Total: 127 mg/dL (ref 100–199)
HDL: 47 mg/dL (ref >39)
LDL Chol Calc (NIH): 63 mg/dL (ref 0–99)
Triglycerides: 90 mg/dL (ref 0–149)
VLDL Cholesterol Cal: 17 mg/dL (ref 5–40)

## 2020-02-02 LAB — PSA TOTAL (REFLEX TO FREE): Prostate Specific Ag, Serum: 0.8 ng/mL (ref 0.0–4.0)

## 2020-02-04 ENCOUNTER — Telehealth: Payer: Self-pay

## 2020-02-04 NOTE — Telephone Encounter (Signed)
-----   Message from Mar Daring, [place] sent at 02/04/2020 12:19 PM EDT ----- Blood count is normal. Kidney and liver function are normal. Sodium, potassium, and calcium are normal. Cholesterol is normal. PSA is normal.

## 2020-02-04 NOTE — Telephone Encounter (Signed)
Patient advised as directed below. 

## 2020-02-21 DIAGNOSIS — R69 Illness, unspecified: Secondary | ICD-10-CM | POA: Diagnosis not present

## 2020-02-28 DIAGNOSIS — C44329 Squamous cell carcinoma of skin of other parts of face: Secondary | ICD-10-CM | POA: Diagnosis not present

## 2020-02-28 DIAGNOSIS — C44629 Squamous cell carcinoma of skin of left upper limb, including shoulder: Secondary | ICD-10-CM | POA: Diagnosis not present

## 2020-02-28 DIAGNOSIS — D0439 Carcinoma in situ of skin of other parts of face: Secondary | ICD-10-CM | POA: Diagnosis not present

## 2020-03-06 DIAGNOSIS — C44529 Squamous cell carcinoma of skin of other part of trunk: Secondary | ICD-10-CM | POA: Diagnosis not present

## 2020-04-16 DIAGNOSIS — R69 Illness, unspecified: Secondary | ICD-10-CM | POA: Diagnosis not present

## 2020-04-21 ENCOUNTER — Other Ambulatory Visit: Payer: Self-pay | Admitting: Family Medicine

## 2020-04-21 DIAGNOSIS — J3089 Other allergic rhinitis: Secondary | ICD-10-CM

## 2020-06-03 DIAGNOSIS — M172 Bilateral post-traumatic osteoarthritis of knee: Secondary | ICD-10-CM | POA: Diagnosis not present

## 2020-06-03 DIAGNOSIS — M25562 Pain in left knee: Secondary | ICD-10-CM | POA: Diagnosis not present

## 2020-06-03 DIAGNOSIS — G8929 Other chronic pain: Secondary | ICD-10-CM | POA: Diagnosis not present

## 2020-06-03 DIAGNOSIS — M25561 Pain in right knee: Secondary | ICD-10-CM | POA: Diagnosis not present

## 2020-06-12 ENCOUNTER — Other Ambulatory Visit: Payer: Self-pay

## 2020-06-12 ENCOUNTER — Telehealth (INDEPENDENT_AMBULATORY_CARE_PROVIDER_SITE_OTHER): Payer: Medicare HMO | Admitting: Physician Assistant

## 2020-06-12 DIAGNOSIS — J069 Acute upper respiratory infection, unspecified: Secondary | ICD-10-CM

## 2020-06-12 MED ORDER — PROMETHAZINE-DM 6.25-15 MG/5ML PO SYRP: 5.0000 mL | ORAL_SOLUTION | Freq: Every evening | ORAL | 0 refills | Status: DC | PRN

## 2020-06-12 NOTE — Progress Notes (Signed)
MyChart Video Visit    Virtual Visit via Video Note   This visit type was conducted due to national recommendations for restrictions regarding the COVID-19 Pandemic (e.g. social distancing) in an effort to limit this patient's exposure and mitigate transmission in our community. This patient is at least at moderate risk for complications without adequate follow up. This format is felt to be most appropriate for this patient at this time. Physical exam was limited by quality of the video and audio technology used for the visit.   Patient location: Home Provider location: Office   I discussed the limitations of evaluation and management by telemedicine and the availability of in person appointments. The patient expressed understanding and agreed to proceed.  [name]   DOB: Oct 01, 1950   69 y.o. Male  MRN: [id number] Visit Date: 06/12/2020  Today's healthcare provider: Trinna Post, PA-C   Chief Complaint  Patient presents with  . URI   Subjective    URI  This is a new problem. The current episode started in the past 7 days (4 days). The problem has been gradually worsening. There has been no fever. Associated symptoms include congestion, coughing, headaches, rhinorrhea and sneezing. He has tried decongestant and antihistamine for the symptoms. The treatment provided mild relief.    Patient reports that he was doing yard work over the weekend and the dust from the leaves and grass got into his throat. He reports that he has had a cough ever since.  He has been using Mucinex D, delsym, robitussin. Denies SOB, fevers, chills, nausea, vomiting.     Medications: Outpatient Medications Prior to Visit  Medication Sig  . acetaminophen (TYLENOL) 500 MG tablet Take 1,000 mg by mouth.  Marland Kitchen aspirin 81 MG tablet Take 81 mg by mouth daily.  Marland Kitchen atorvastatin (LIPITOR) 40 MG tablet Take 1 tablet by mouth daily.  . Famotidine (PEPCID PO) Take by mouth.  . fluticasone (FLONASE)  50 MCG/ACT nasal spray Place 2 sprays into both nostrils daily.  . Misc Natural Products (OSTEO BI-FLEX JOINT SHIELD PO) Take 2 tablets by mouth daily.  . montelukast (SINGULAIR) 10 MG tablet TAKE 1 TABLET BY MOUTH EVERYDAY AT BEDTIME  . Docusate Calcium (STOOL SOFTENER PO) Take by mouth.  . loratadine (CLARITIN) 10 MG tablet Take by mouth.   No facility-administered medications prior to visit.    Review of Systems  HENT: Positive for congestion, rhinorrhea and sneezing.   Respiratory: Positive for cough.   Neurological: Positive for headaches.      Objective    There were no vitals taken for this visit.   Physical Exam Constitutional:      Appearance: Normal appearance.  Pulmonary:     Effort: Pulmonary effort is normal. No respiratory distress.  Neurological:     Mental Status: He is alert.  Psychiatric:        Mood and Affect: Mood normal.        Behavior: Behavior normal.        Assessment & Plan     1. Upper respiratory tract infection, unspecified type  Counseled regarding signs and symptoms of viral and bacterial respiratory infections. Advised to call or return for additional evaluation if he develops any sign of bacterial infection, or if current symptoms last longer than 10 days.   - promethazine-dextromethorphan (PROMETHAZINE-DM) 6.25-15 MG/5ML syrup; Take 5 mLs by mouth at bedtime as needed.  Dispense: 118 mL; Refill: 0   No follow-ups on file.  I discussed the assessment and treatment plan with the patient. The patient was provided an opportunity to ask questions and all were answered. The patient agreed with the plan and demonstrated an understanding of the instructions.   The patient was advised to call back or seek an in-person evaluation if the symptoms worsen or if the condition fails to improve as anticipated.  ITrinna Post, PA-C, have reviewed all documentation for this visit. The documentation on 06/12/20 for the exam, diagnosis,  procedures, and orders are all accurate and complete.  The entirety of the information documented in the History of Present Illness, Review of Systems and Physical Exam were personally obtained by me. Portions of this information were initially documented by [facility] and reviewed by me for thoroughness and accuracy.     Paulene Floor [facility] [phone] (phone) [phone] (fax)  [place]

## 2020-06-20 ENCOUNTER — Telehealth: Payer: Self-pay | Admitting: Family Medicine

## 2020-06-20 MED ORDER — AMOXICILLIN-POT CLAVULANATE 875-125 MG PO TABS
1.0000 | ORAL_TABLET | Freq: Two times a day (BID) | ORAL | 0 refills | Status: DC
Start: 2020-06-20 — End: 2020-07-18

## 2020-06-20 NOTE — Telephone Encounter (Signed)
Okay to prescribe Augmentin 1 tab twice daily for 7 days #14 0 refills for possible sinusitis

## 2020-06-20 NOTE — Telephone Encounter (Signed)
Please review. Thanks!  

## 2020-06-20 NOTE — Telephone Encounter (Signed)
Patient was advised by Ryan English on 06/12/2020 to  follow up in 7 to 8 days if symptoms did not improve, patient experiencing the following,  nasal and throat drainage and coughing up phelgm mainly at night, no fever. Promethazine-dextromethorphan (PROMETHAZINE-DM) 6.25-15 MG/5ML syrup is not working. Requesting RX    [id number] [place], [place] - [address] Phone:  [phone]  Fax:  [phone]

## 2020-07-03 DIAGNOSIS — E782 Mixed hyperlipidemia: Secondary | ICD-10-CM | POA: Diagnosis not present

## 2020-07-03 DIAGNOSIS — Z23 Encounter for immunization: Secondary | ICD-10-CM | POA: Diagnosis not present

## 2020-07-03 DIAGNOSIS — I2510 Atherosclerotic heart disease of native coronary artery without angina pectoris: Secondary | ICD-10-CM | POA: Diagnosis not present

## 2020-07-17 ENCOUNTER — Other Ambulatory Visit: Payer: Self-pay | Admitting: Family Medicine

## 2020-07-17 DIAGNOSIS — J3089 Other allergic rhinitis: Secondary | ICD-10-CM

## 2020-07-18 ENCOUNTER — Encounter: Payer: Self-pay | Admitting: Physician Assistant

## 2020-07-18 ENCOUNTER — Telehealth (INDEPENDENT_AMBULATORY_CARE_PROVIDER_SITE_OTHER): Payer: Medicare HMO | Admitting: Physician Assistant

## 2020-07-18 VITALS — Wt 201.0 lb

## 2020-07-18 DIAGNOSIS — J0140 Acute pansinusitis, unspecified: Secondary | ICD-10-CM

## 2020-07-18 MED ORDER — AMOXICILLIN-POT CLAVULANATE 875-125 MG PO TABS: 1.0000 | ORAL_TABLET | Freq: Two times a day (BID) | ORAL | 0 refills | Status: DC

## 2020-07-18 NOTE — Progress Notes (Signed)
Virtual telephone visit    Virtual Visit via Telephone Note   This visit type was conducted due to national recommendations for restrictions regarding the COVID-19 Pandemic (e.g. social distancing) in an effort to limit this patient's exposure and mitigate transmission in our community. Due to his co-morbid illnesses, this patient is at least at moderate risk for complications without adequate follow up. This format is felt to be most appropriate for this patient at this time. The patient did not have access to video technology or had technical difficulties with video requiring transitioning to audio format only (telephone). Physical exam was limited to content and character of the telephone converstion.    Patient location: home Provider location: [facility]  I discussed the limitations of evaluation and management by telemedicine and the availability of in person appointments. The patient expressed understanding and agreed to proceed.   Visit Date: 07/18/2020  Today's healthcare provider: Mar Daring, PA-C   Chief Complaint  Patient presents with  . URI   Subjective    HPI HPI    URI    Associated symptoms inlclude cough.  Recent episode started yesterday.  The problem has been unchanged since onset.  The temperature has been with in normal range.  Patient  is drinking plenty of fluids.  Past hisotry is significant for  no history of pneumonia or bronchitis.  Patient is not a smoker.       Last edited by Dorian Pod, CMA on 07/18/2020  5:12 PM. (History)      Patient reports symptoms started back yesterday. Had similar symptoms about a week ago and symptoms improved, but never completely resolved.     Patient Active Problem List   Diagnosis Date Noted  . Overweight 01/15/2019  . Gastroesophageal reflux disease without esophagitis 06/24/2016  . Degenerative arthritis of spine 11/06/2015  . Neuralgia neuritis, sciatic nerve 11/06/2015   . Lumbar canal stenosis 11/06/2015  . SPL (spondylolisthesis) 11/06/2015  . Adrenal adenoma 05/26/2015  . Atherosclerosis of coronary artery 05/26/2015  . Allergic rhinitis 04/09/2015  . Bradycardia 08/02/2014  . Combined fat and carbohydrate induced hyperlipemia 08/02/2014   Past Medical History:  Diagnosis Date  . Arthritis   . GERD (gastroesophageal reflux disease)   . Hyperlipidemia       Medications: Outpatient Medications Prior to Visit  Medication Sig  . acetaminophen (TYLENOL) 500 MG tablet Take 1,000 mg by mouth.  Marland Kitchen aspirin 81 MG tablet Take 81 mg by mouth daily.  Marland Kitchen atorvastatin (LIPITOR) 40 MG tablet Take 1 tablet by mouth daily.  . fluticasone (FLONASE) 50 MCG/ACT nasal spray Place 2 sprays into both nostrils daily.  . Misc Natural Products (OSTEO BI-FLEX JOINT SHIELD PO) Take 2 tablets by mouth daily.  . montelukast (SINGULAIR) 10 MG tablet TAKE 1 TABLET BY MOUTH EVERYDAY AT BEDTIME  . [DISCONTINUED] amoxicillin-clavulanate (AUGMENTIN) 875-125 MG tablet Take 1 tablet by mouth 2 (two) times daily.  . [DISCONTINUED] Docusate Calcium (STOOL SOFTENER PO) Take by mouth.  . [DISCONTINUED] Famotidine (PEPCID PO) Take by mouth. (Patient not taking: Reported on 07/18/2020)  . [DISCONTINUED] loratadine (CLARITIN) 10 MG tablet Take by mouth.  . [DISCONTINUED] promethazine-dextromethorphan (PROMETHAZINE-DM) 6.25-15 MG/5ML syrup Take 5 mLs by mouth at bedtime as needed.   No facility-administered medications prior to visit.    Review of Systems  Constitutional: Negative for activity change, appetite change, chills, fatigue and fever.  HENT: Positive for postnasal drip and sinus pain. Negative for congestion, ear pain, rhinorrhea,  sinus pressure and sore throat.   Respiratory: Positive for cough. Negative for chest tightness, shortness of breath and wheezing.   Cardiovascular: Negative for chest pain and palpitations.  Neurological: Negative for headaches.    Last CBC Lab  Results  Component Value Date   WBC 5.2 02/01/2020   HGB 15.0 02/01/2020   HCT 43.0 02/01/2020   MCV 87 02/01/2020   MCH 30.3 02/01/2020   RDW 13.0 02/01/2020   PLT 176 02/01/2020      Objective    Wt 201 lb (91.2 kg)   BMI 27.64 kg/m  BP Readings from Last 3 Encounters:  02/01/20 120/79  01/15/19 135/83  08/01/18 140/80   Wt Readings from Last 3 Encounters:  07/18/20 201 lb (91.2 kg)  02/01/20 (!) 206 lb 3.2 oz (93.5 kg)  01/15/19 211 lb (95.7 kg)        Assessment & Plan     1. Acute non-recurrent pansinusitis Worsening symptoms that have not responded to OTC medications. Suspect second sickening, worrisome for bacterial infection. Will give augmentin as below. Continue allergy medications. Stay well hydrated and get plenty of rest. Call if no symptom improvement or if symptoms worsen. - amoxicillin-clavulanate (AUGMENTIN) 875-125 MG tablet; Take 1 tablet by mouth 2 (two) times daily.  Dispense: 20 tablet; Refill: 0   Return if symptoms worsen or fail to improve.    I discussed the assessment and treatment plan with the patient. The patient was provided an opportunity to ask questions and all were answered. The patient agreed with the plan and demonstrated an understanding of the instructions.   The patient was advised to call back or seek an in-person evaluation if the symptoms worsen or if the condition fails to improve as anticipated.  I provided 12 minutes of non-face-to-face time during this encounter.  Reynolds Bowl, PA-C, have reviewed all documentation for this visit. The documentation on 07/21/20 for the exam, diagnosis, procedures, and orders are all accurate and complete.  Rubye Beach [facility] [phone] (phone) [phone] (fax)  [place]

## 2020-07-21 ENCOUNTER — Encounter: Payer: Self-pay | Admitting: Physician Assistant

## 2020-07-21 NOTE — Patient Instructions (Signed)

## 2020-07-23 DIAGNOSIS — Z03818 Encounter for observation for suspected exposure to other biological agents ruled out: Secondary | ICD-10-CM | POA: Diagnosis not present

## 2020-07-23 DIAGNOSIS — Z20822 Contact with and (suspected) exposure to covid-19: Secondary | ICD-10-CM | POA: Diagnosis not present

## 2020-08-28 DIAGNOSIS — M1711 Unilateral primary osteoarthritis, right knee: Secondary | ICD-10-CM | POA: Diagnosis not present

## 2020-08-28 DIAGNOSIS — M1712 Unilateral primary osteoarthritis, left knee: Secondary | ICD-10-CM | POA: Diagnosis not present

## 2020-09-15 DIAGNOSIS — M1711 Unilateral primary osteoarthritis, right knee: Secondary | ICD-10-CM | POA: Diagnosis not present

## 2020-09-22 DIAGNOSIS — M1711 Unilateral primary osteoarthritis, right knee: Secondary | ICD-10-CM | POA: Diagnosis not present

## 2020-09-23 DIAGNOSIS — D225 Melanocytic nevi of trunk: Secondary | ICD-10-CM | POA: Diagnosis not present

## 2020-09-23 DIAGNOSIS — D2272 Melanocytic nevi of left lower limb, including hip: Secondary | ICD-10-CM | POA: Diagnosis not present

## 2020-09-23 DIAGNOSIS — D485 Neoplasm of uncertain behavior of skin: Secondary | ICD-10-CM | POA: Diagnosis not present

## 2020-09-23 DIAGNOSIS — Z85828 Personal history of other malignant neoplasm of skin: Secondary | ICD-10-CM | POA: Diagnosis not present

## 2020-09-23 DIAGNOSIS — D2261 Melanocytic nevi of right upper limb, including shoulder: Secondary | ICD-10-CM | POA: Diagnosis not present

## 2020-09-23 DIAGNOSIS — L570 Actinic keratosis: Secondary | ICD-10-CM | POA: Diagnosis not present

## 2020-09-29 DIAGNOSIS — M1711 Unilateral primary osteoarthritis, right knee: Secondary | ICD-10-CM | POA: Diagnosis not present

## 2020-10-09 ENCOUNTER — Other Ambulatory Visit: Payer: Self-pay | Admitting: Family Medicine

## 2020-10-09 DIAGNOSIS — J3089 Other allergic rhinitis: Secondary | ICD-10-CM

## 2020-10-20 DIAGNOSIS — L570 Actinic keratosis: Secondary | ICD-10-CM | POA: Diagnosis not present

## 2020-12-25 ENCOUNTER — Other Ambulatory Visit: Payer: Self-pay | Admitting: Family Medicine

## 2020-12-25 DIAGNOSIS — J3089 Other allergic rhinitis: Secondary | ICD-10-CM

## 2020-12-25 NOTE — Telephone Encounter (Signed)
Rx remains valid. Future visit in 2 months with Chalmers Cater, NP at [facility]

## 2020-12-26 DIAGNOSIS — J209 Acute bronchitis, unspecified: Secondary | ICD-10-CM | POA: Diagnosis not present

## 2020-12-26 DIAGNOSIS — Z03818 Encounter for observation for suspected exposure to other biological agents ruled out: Secondary | ICD-10-CM | POA: Diagnosis not present

## 2021-02-02 ENCOUNTER — Encounter: Payer: Self-pay | Admitting: Family Medicine

## 2021-02-02 ENCOUNTER — Ambulatory Visit (INDEPENDENT_AMBULATORY_CARE_PROVIDER_SITE_OTHER): Payer: Medicare HMO | Admitting: Family Medicine

## 2021-02-02 ENCOUNTER — Other Ambulatory Visit: Payer: Self-pay

## 2021-02-02 VITALS — BP 123/73 | HR 71 | Temp 98.3°F | Resp 16 | Ht 72.0 in | Wt 203.0 lb

## 2021-02-02 DIAGNOSIS — E663 Overweight: Secondary | ICD-10-CM | POA: Diagnosis not present

## 2021-02-02 DIAGNOSIS — Z1211 Encounter for screening for malignant neoplasm of colon: Secondary | ICD-10-CM | POA: Diagnosis not present

## 2021-02-02 DIAGNOSIS — R739 Hyperglycemia, unspecified: Secondary | ICD-10-CM | POA: Diagnosis not present

## 2021-02-02 DIAGNOSIS — Z0000 Encounter for general adult medical examination without abnormal findings: Secondary | ICD-10-CM

## 2021-02-02 DIAGNOSIS — Z125 Encounter for screening for malignant neoplasm of prostate: Secondary | ICD-10-CM

## 2021-02-02 DIAGNOSIS — E782 Mixed hyperlipidemia: Secondary | ICD-10-CM | POA: Diagnosis not present

## 2021-02-02 NOTE — Assessment & Plan Note (Signed)
Previously well controlled Continue statin Repeat FLP and CMP  

## 2021-02-02 NOTE — Assessment & Plan Note (Signed)
Discussed importance of healthy weight management Discussed diet and exercise  

## 2021-02-02 NOTE — Progress Notes (Signed)
Annual Wellness Visit     [name], Male    DOB: [birth year], 70 y.o.   MRN: [id number] Visit Date: 02/02/2021  Today's Provider: Lavon Paganini, MD   Chief Complaint  Patient presents with   Annual Exam   Subjective    Ryan English is a 70 y.o. male who presents today for his Annual Wellness Visit. He reports consuming a general diet. The patient does not participate in regular exercise at present. He generally feels fairly well. He reports sleeping fairly well. He does not have additional problems to discuss today.   He will be having bilateral knee surgery and back surgery coming up soon but outside of these appointments he is in good health.   Vaccines  He is scheduled today for his 4th COVID vaccines. Otherwise he is current on other vaccines.  Screenings  He is due for his colonoscopy and is amenable for a schedule appointment.      Medications: Outpatient Medications Prior to Visit  Medication Sig   acetaminophen (TYLENOL) 500 MG tablet Take 1,000 mg by mouth.   aspirin 81 MG tablet Take 81 mg by mouth daily.   atorvastatin (LIPITOR) 40 MG tablet Take 1 tablet by mouth daily.   fluticasone (FLONASE) 50 MCG/ACT nasal spray Place 2 sprays into both nostrils daily.   Misc Natural Products (OSTEO BI-FLEX JOINT SHIELD PO) Take 2 tablets by mouth daily.   montelukast (SINGULAIR) 10 MG tablet TAKE 1 TABLET BY MOUTH EVERYDAY AT BEDTIME   [DISCONTINUED] amoxicillin-clavulanate (AUGMENTIN) 875-125 MG tablet Take 1 tablet by mouth 2 (two) times daily.   No facility-administered medications prior to visit.    Allergies  Allergen Reactions   Other     Patient Care Team: Virginia Crews, MD as PCP - General (Family Medicine) Bary Castilla Forest Gleason, MD as Consulting Physician (General Surgery)  Review of Systems  Constitutional:  Negative for chills, diaphoresis, fatigue and fever.  HENT:  Negative for ear pain, sinus pressure, sinus pain and  sore throat.   Eyes:  Negative for pain and visual disturbance.  Respiratory:  Negative for cough, chest tightness, shortness of breath and wheezing.   Cardiovascular:  Negative for chest pain, palpitations and leg swelling.  Gastrointestinal:  Negative for abdominal pain, blood in stool, constipation, diarrhea, nausea and vomiting.  Genitourinary:  Negative for dysuria, flank pain, frequency and urgency.  Musculoskeletal:  Negative for back pain, myalgias and neck pain.  Neurological:  Negative for dizziness, seizures, syncope, weakness, light-headedness, numbness and headaches.  All other systems reviewed and are negative.      Objective    Vitals: BP 123/73   Pulse 71   Temp 98.3 F (36.8 C)   Resp 16   Ht 6' (1.829 m)   Wt 203 lb (92.1 kg)   BMI 27.53 kg/m    Physical Exam Vitals reviewed.  Constitutional:      General: He is not in acute distress.    Appearance: Normal appearance. He is well-developed. He is not diaphoretic.  HENT:     Head: Normocephalic and atraumatic.     Right Ear: Tympanic membrane, ear canal and external ear normal.     Left Ear: Tympanic membrane, ear canal and external ear normal.     Nose: Nose normal.     Mouth/Throat:     Mouth: Mucous membranes are moist.     Pharynx: Oropharynx is clear. No oropharyngeal exudate.  Eyes:  General: No scleral icterus.    Conjunctiva/sclera: Conjunctivae normal.     Pupils: Pupils are equal, round, and reactive to light.  Neck:     Thyroid: No thyromegaly.  Cardiovascular:     Rate and Rhythm: Normal rate and regular rhythm.     Pulses: Normal pulses.     Heart sounds: Normal heart sounds. No murmur heard. Pulmonary:     Effort: Pulmonary effort is normal. No respiratory distress.     Breath sounds: Normal breath sounds. No wheezing or rales.  Abdominal:     General: There is no distension.     Palpations: Abdomen is soft.     Tenderness: no abdominal tenderness  Musculoskeletal:         General: No deformity.     Cervical back: Neck supple.     Right lower leg: No edema.     Left lower leg: No edema.  Lymphadenopathy:     Cervical: No cervical adenopathy.  Skin:    General: Skin is warm and dry.     Findings: No rash.  Neurological:     Mental Status: He is alert and oriented to person, place, and time. Mental status is at baseline.     Sensory: No sensory deficit.     Motor: No weakness.     Gait: Gait normal.  Psychiatric:        Mood and Affect: Mood normal.        Behavior: Behavior normal.        Thought Content: Thought content normal.   Most recent functional status assessment: In your present state of health, do you have any difficulty performing the following activities: 02/02/2021  Hearing? N  Vision? N  Difficulty concentrating or making decisions? N  Walking or climbing stairs? N  Dressing or bathing? N  Doing errands, shopping? N  Some recent data might be hidden   Most recent fall risk assessment: Fall Risk  02/02/2021  Falls in the past year? 0  Number falls in past yr: 0  Injury with Fall? 0  Risk for fall due to : No Fall Risks  Follow up Falls evaluation completed    Most recent depression screenings: PHQ 2/9 Scores 02/02/2021 02/01/2020  PHQ - 2 Score 0 0  PHQ- 9 Score 0 -   Most recent cognitive screening: No flowsheet data found. Most recent Audit-C alcohol use screening Alcohol Use Disorder Test (AUDIT) 02/02/2021  1. How often do you have a drink containing alcohol? 3  2. How many drinks containing alcohol do you have on a typical day when you are drinking? 0  3. How often do you have six or more drinks on one occasion? 0  AUDIT-C Score 3  4. How often during the last year have you found that you were not able to stop drinking once you had started? 0  5. How often during the last year have you failed to do what was normally expected from you because of drinking? 0  6. How often during the last year have you needed a first drink in  the morning to get yourself going after a heavy drinking session? 0  7. How often during the last year have you had a feeling of guilt of remorse after drinking? 0  8. How often during the last year have you been unable to remember what happened the night before because you had been drinking? 0  9. Have you or someone else been injured as a result  of your drinking? 0  10. Has a relative or friend or a doctor or another health worker been concerned about your drinking or suggested you cut down? 0  Alcohol Use Disorder Identification Test Final Score (AUDIT) 3   A score of 3 or more in women, and 4 or more in men indicates increased risk for alcohol abuse, EXCEPT if all of the points are from question 1   No results found for any visits on 02/02/21.  Assessment & Plan     Problem List Items Addressed This Visit       Other   Combined fat and carbohydrate induced hyperlipemia    Previously well controlled Continue statin Repeat FLP and CMP       Relevant Orders   Lipid panel   Comprehensive metabolic panel   Overweight    Discussed importance of healthy weight management Discussed diet and exercise        Other Visit Diagnoses     Encounter for annual wellness visit (AWV) in Medicare patient    -  Primary   Encounter for annual physical exam       Relevant Orders   Ambulatory referral to Gastroenterology   Hemoglobin A1c   Lipid panel   Comprehensive metabolic panel   PSA Total (Reflex To Free)   Screen for colon cancer       Relevant Orders   Ambulatory referral to Gastroenterology   Hyperglycemia       Relevant Orders   Hemoglobin A1c   Prostate cancer screening       Relevant Orders   PSA Total (Reflex To Free)      Annual wellness visit done today including the all of the following: Reviewed patient's Family Medical History Reviewed and updated list of patient's medical providers Assessment of cognitive impairment was done Assessed patient's functional  ability Established a written schedule for health screening [place] Completed and Reviewed  Exercise Activities and Dietary recommendations  Goals   None     Immunization History  Administered Date(s) Administered   Influenza, High Dose Seasonal PF 06/07/2017, 04/26/2018   Influenza,inj,Quad PF,6+ Mos 04/05/2015   Influenza-Unspecified 04/13/2019   PFIZER(Purple Top)SARS-COV-2 Vaccination 07/25/2019, 08/15/2019, 05/14/2020   Pneumococcal Conjugate-13 11/17/2016   Pneumococcal Polysaccharide-23 11/22/2017   Td 05/17/2007   Tdap 05/17/2007, 09/23/2017   Zoster Recombinat (Shingrix) 05/18/2019, 10/03/2019   Zoster, Live 08/21/2012    Health Maintenance  Topic Date Due   COLONOSCOPY (Pts 45-51yr Insurance coverage will need to be confirmed)  09/08/2020   COVID-19 Vaccine (4 - Booster for Pfizer series) 09/11/2020   INFLUENZA VACCINE  02/02/2021   TETANUS/TDAP  09/24/2027   Hepatitis C Screening  Completed   PNA vac Low Risk Adult  Completed   Zoster Vaccines- Shingrix  Completed   HPV VACCINES  Aged Out   Discussed health benefits of physical activity, and encouraged him to engage in regular exercise appropriate for his age and condition.    Return in about 1 year (around 02/02/2022) for AWV, CPE.     I,Essence Turner,acting as a scribe for ALavon Paganini MD.,have documented all relevant documentation on the behalf of ALavon Paganini MD,as directed by  ALavon Paganini MD while in the presence of ALavon Paganini MD.  I, ALavon Paganini MD, have reviewed all documentation for this visit. The documentation on 02/02/21 for the exam, diagnosis, procedures, and orders are all accurate and complete.   Azhia Siefken, ADionne Bucy MD, MPH [facility]

## 2021-02-03 LAB — PSA TOTAL (REFLEX TO FREE): Prostate Specific Ag, Serum: 1.1 ng/mL (ref 0.0–4.0)

## 2021-02-03 LAB — COMPREHENSIVE METABOLIC PANEL
ALT: 21 IU/L (ref 0–44)
AST: 20 IU/L (ref 0–40)
Albumin/Globulin Ratio: 2 (ref 1.2–2.2)
Albumin: 4.7 g/dL (ref 3.8–4.8)
Alkaline Phosphatase: 53 IU/L (ref 44–121)
BUN/Creatinine Ratio: 10 (ref 10–24)
BUN: 11 mg/dL (ref 8–27)
Bilirubin Total: 0.8 mg/dL (ref 0.0–1.2)
CO2: 23 mmol/L (ref 20–29)
Calcium: 9.3 mg/dL (ref 8.6–10.2)
Chloride: 102 mmol/L (ref 96–106)
Creatinine, Ser: 1.12 mg/dL (ref 0.76–1.27)
Globulin, Total: 2.3 g/dL (ref 1.5–4.5)
Glucose: 121 mg/dL — ABNORMAL HIGH (ref 65–99)
Potassium: 4.1 mmol/L (ref 3.5–5.2)
Sodium: 141 mmol/L (ref 134–144)
Total Protein: 7 g/dL (ref 6.0–8.5)
eGFR: 71 mL/min/{1.73_m2} (ref >59)

## 2021-02-03 LAB — LIPID PANEL
Chol/HDL Ratio: 2.7 ratio (ref 0.0–5.0)
Cholesterol, Total: 141 mg/dL (ref 100–199)
HDL: 52 mg/dL (ref >39)
LDL Chol Calc (NIH): 69 mg/dL (ref 0–99)
Triglycerides: 113 mg/dL (ref 0–149)
VLDL Cholesterol Cal: 20 mg/dL (ref 5–40)

## 2021-02-03 LAB — HEMOGLOBIN A1C
Est. average glucose Bld gHb Est-mCnc: 117 mg/dL
Hgb A1c MFr Bld: 5.7 % — ABNORMAL HIGH (ref 4.8–5.6)

## 2021-02-12 ENCOUNTER — Telehealth: Payer: Self-pay

## 2021-02-23 ENCOUNTER — Other Ambulatory Visit: Payer: Self-pay

## 2021-02-23 ENCOUNTER — Telehealth (INDEPENDENT_AMBULATORY_CARE_PROVIDER_SITE_OTHER): Payer: Self-pay | Admitting: Gastroenterology

## 2021-02-23 DIAGNOSIS — Z1211 Encounter for screening for malignant neoplasm of colon: Secondary | ICD-10-CM

## 2021-02-23 MED ORDER — CLENPIQ 10-3.5-12 MG-GM -GM/160ML PO SOLN: 320.0000 mL | Freq: Once | ORAL | 0 refills | Status: AC

## 2021-02-23 NOTE — Progress Notes (Signed)
Gastroenterology Pre-Procedure Review  Request Date: 04/08/2021 Requesting Physician: Dr. Marius Ditch  PATIENT REVIEW QUESTIONS: The patient responded to the following health history questions as indicated:    1. Are you having any GI issues? no 2. Do you have a personal history of Polyps? no 3. Do you have a family history of Colon Cancer or Polyps? no 4. Diabetes Mellitus? no 5. Joint replacements in the past 12 months?no 6. Major health problems in the past 3 months?no 7. Any artificial heart valves, MVP, or defibrillator?no    MEDICATIONS & ALLERGIES:    Patient reports the following regarding taking any anticoagulation/antiplatelet therapy:   Plavix, Coumadin, Eliquis, Xarelto, Lovenox, Pradaxa, Brilinta, or Effient? no Aspirin? Aspirin '81mg'$    Patient confirms/reports the following medications:  Current Outpatient Medications  Medication Sig Dispense Refill   acetaminophen (TYLENOL) 500 MG tablet Take 1,000 mg by mouth.     aspirin 81 MG tablet Take 81 mg by mouth daily.     atorvastatin (LIPITOR) 40 MG tablet Take 1 tablet by mouth daily.     fluticasone (FLONASE) 50 MCG/ACT nasal spray Place 2 sprays into both nostrils daily. 16 g 6   Misc Natural Products (OSTEO BI-FLEX JOINT SHIELD PO) Take 2 tablets by mouth daily.     montelukast (SINGULAIR) 10 MG tablet TAKE 1 TABLET BY MOUTH EVERYDAY AT BEDTIME 90 tablet 0   No current facility-administered medications for this visit.    Patient confirms/reports the following allergies:  Allergies  Allergen Reactions   Other     No orders of the defined types were placed in this encounter.   AUTHORIZATION INFORMATION Primary Insurance: 1D#: Group #:  Secondary Insurance: 1D#: Group #:  SCHEDULE INFORMATION: Date:  Time: Location:

## 2021-03-04 ENCOUNTER — Ambulatory Visit: Payer: Self-pay | Admitting: Adult Health

## 2021-03-11 ENCOUNTER — Telehealth: Payer: Self-pay

## 2021-03-11 MED ORDER — GOLYTELY 236 G PO SOLR: 4000.0000 mL | Freq: Once | ORAL | 0 refills | Status: AC

## 2021-03-11 NOTE — Telephone Encounter (Signed)
Patient left a voicemail stating that the prep that was called in is not covered by his insurance. Called In [place] for patient and called and talk to patient wife. Gave her the instructions for the prep and also sent to mychart account the instructions

## 2021-03-23 ENCOUNTER — Other Ambulatory Visit: Payer: Self-pay | Admitting: Physician Assistant

## 2021-03-23 ENCOUNTER — Other Ambulatory Visit: Payer: Self-pay | Admitting: Family Medicine

## 2021-03-23 DIAGNOSIS — J3089 Other allergic rhinitis: Secondary | ICD-10-CM

## 2021-03-23 DIAGNOSIS — J301 Allergic rhinitis due to pollen: Secondary | ICD-10-CM

## 2021-03-30 DIAGNOSIS — D225 Melanocytic nevi of trunk: Secondary | ICD-10-CM | POA: Diagnosis not present

## 2021-03-30 DIAGNOSIS — Z85828 Personal history of other malignant neoplasm of skin: Secondary | ICD-10-CM | POA: Diagnosis not present

## 2021-03-30 DIAGNOSIS — X32XXXA Exposure to sunlight, initial encounter: Secondary | ICD-10-CM | POA: Diagnosis not present

## 2021-03-30 DIAGNOSIS — L570 Actinic keratosis: Secondary | ICD-10-CM | POA: Diagnosis not present

## 2021-03-30 DIAGNOSIS — D2261 Melanocytic nevi of right upper limb, including shoulder: Secondary | ICD-10-CM | POA: Diagnosis not present

## 2021-03-30 DIAGNOSIS — D2272 Melanocytic nevi of left lower limb, including hip: Secondary | ICD-10-CM | POA: Diagnosis not present

## 2021-04-01 ENCOUNTER — Encounter: Payer: Self-pay | Admitting: Family Medicine

## 2021-04-07 ENCOUNTER — Encounter: Payer: Self-pay | Admitting: Gastroenterology

## 2021-04-07 DIAGNOSIS — M170 Bilateral primary osteoarthritis of knee: Secondary | ICD-10-CM | POA: Diagnosis not present

## 2021-04-07 DIAGNOSIS — M1711 Unilateral primary osteoarthritis, right knee: Secondary | ICD-10-CM | POA: Diagnosis not present

## 2021-04-07 DIAGNOSIS — M1712 Unilateral primary osteoarthritis, left knee: Secondary | ICD-10-CM | POA: Diagnosis not present

## 2021-04-08 ENCOUNTER — Encounter
Admission: RE | Disposition: A | Discharge status: Home / Self Care | Payer: Self-pay | Source: Ambulatory Visit | Attending: Gastroenterology

## 2021-04-08 ENCOUNTER — Encounter: Payer: Self-pay | Admitting: Gastroenterology

## 2021-04-08 ENCOUNTER — Ambulatory Visit
Admission: RE | Admit: 2021-04-08 | Discharge: 2021-04-08 | Disposition: A | Discharge status: Home / Self Care | Payer: Medicare HMO | Source: Ambulatory Visit | Attending: Gastroenterology | Admitting: Gastroenterology

## 2021-04-08 ENCOUNTER — Ambulatory Visit: Payer: Medicare HMO | Admitting: Anesthesiology

## 2021-04-08 DIAGNOSIS — Z79899 Other long term (current) drug therapy: Secondary | ICD-10-CM | POA: Diagnosis not present

## 2021-04-08 DIAGNOSIS — E785 Hyperlipidemia, unspecified: Secondary | ICD-10-CM | POA: Insufficient documentation

## 2021-04-08 DIAGNOSIS — Z1211 Encounter for screening for malignant neoplasm of colon: Secondary | ICD-10-CM | POA: Diagnosis not present

## 2021-04-08 DIAGNOSIS — K644 Residual hemorrhoidal skin tags: Secondary | ICD-10-CM | POA: Diagnosis not present

## 2021-04-08 DIAGNOSIS — D124 Benign neoplasm of descending colon: Secondary | ICD-10-CM | POA: Diagnosis not present

## 2021-04-08 DIAGNOSIS — D122 Benign neoplasm of ascending colon: Secondary | ICD-10-CM | POA: Diagnosis not present

## 2021-04-08 DIAGNOSIS — K5730 Diverticulosis of large intestine without perforation or abscess without bleeding: Secondary | ICD-10-CM | POA: Insufficient documentation

## 2021-04-08 DIAGNOSIS — K635 Polyp of colon: Secondary | ICD-10-CM | POA: Diagnosis not present

## 2021-04-08 DIAGNOSIS — K219 Gastro-esophageal reflux disease without esophagitis: Secondary | ICD-10-CM | POA: Diagnosis not present

## 2021-04-08 DIAGNOSIS — Z7982 Long term (current) use of aspirin: Secondary | ICD-10-CM | POA: Diagnosis not present

## 2021-04-08 HISTORY — PX: COLONOSCOPY WITH PROPOFOL: SHX5780

## 2021-04-08 SURGERY — COLONOSCOPY WITH PROPOFOL
Anesthesia: General

## 2021-04-08 MED ORDER — MIDAZOLAM HCL 2 MG/2ML IJ SOLN
INTRAMUSCULAR | Status: DC | PRN
  Administered 2021-04-08: 2 mg via INTRAVENOUS

## 2021-04-08 MED ORDER — PROPOFOL 500 MG/50ML IV EMUL
INTRAVENOUS | Status: DC | PRN
  Administered 2021-04-08: 50 ug/kg/min via INTRAVENOUS

## 2021-04-08 MED ORDER — EPHEDRINE 5 MG/ML INJ
INTRAVENOUS | Status: AC
  Filled 2021-04-08: qty 5

## 2021-04-08 MED ORDER — PHENYLEPHRINE HCL (PRESSORS) 10 MG/ML IV SOLN
INTRAVENOUS | Status: AC
  Filled 2021-04-08: qty 1

## 2021-04-08 MED ORDER — PROPOFOL 10 MG/ML IV BOLUS
INTRAVENOUS | Status: DC | PRN
  Administered 2021-04-08: 10 mg via INTRAVENOUS
  Administered 2021-04-08 (×2): 20 mg via INTRAVENOUS

## 2021-04-08 MED ORDER — SODIUM CHLORIDE 0.9 % IV SOLN: INTRAVENOUS | Status: DC | PRN

## 2021-04-08 MED ORDER — FENTANYL CITRATE (PF) 100 MCG/2ML IJ SOLN
INTRAMUSCULAR | Status: DC | PRN
  Administered 2021-04-08 (×2): 50 ug via INTRAVENOUS

## 2021-04-08 MED ORDER — PROPOFOL 500 MG/50ML IV EMUL
INTRAVENOUS | Status: AC
  Filled 2021-04-08: qty 100

## 2021-04-08 MED ORDER — LIDOCAINE HCL (PF) 2 % IJ SOLN
INTRAMUSCULAR | Status: AC
  Filled 2021-04-08: qty 10

## 2021-04-08 MED ORDER — FENTANYL CITRATE (PF) 100 MCG/2ML IJ SOLN
INTRAMUSCULAR | Status: AC
  Filled 2021-04-08: qty 2

## 2021-04-08 MED ORDER — LIDOCAINE HCL (CARDIAC) PF 100 MG/5ML IV SOSY
PREFILLED_SYRINGE | INTRAVENOUS | Status: DC | PRN
Start: 2021-04-08 — End: 2021-04-08
  Administered 2021-04-08: 50 mg via INTRAVENOUS

## 2021-04-08 MED ORDER — EPHEDRINE SULFATE 50 MG/ML IJ SOLN
INTRAMUSCULAR | Status: DC | PRN
  Administered 2021-04-08: 10 mg via INTRAVENOUS

## 2021-04-08 MED ORDER — MIDAZOLAM HCL 2 MG/2ML IJ SOLN
INTRAMUSCULAR | Status: AC
  Filled 2021-04-08: qty 2

## 2021-04-08 NOTE — Transfer of Care (Signed)
Immediate Anesthesia Transfer of Care Note  [name]  Procedure(s) Performed: COLONOSCOPY WITH PROPOFOL  Patient Location: PACU  Anesthesia Type:General  Level of Consciousness: sedated  Airway & Oxygen Therapy: Patient Spontanous Breathing and Patient connected to nasal cannula oxygen  Post-op Assessment: Report given to RN and Post -op Vital signs reviewed and stable  Post vital signs: Reviewed and stable  Last Vitals:  Vitals Value Taken Time  BP 96/52 04/08/21 0923  Temp    Pulse 71 04/08/21 0923  Resp 19 04/08/21 0923  SpO2 97 % 04/08/21 0923  Vitals shown include unvalidated device data.  Last Pain:  Vitals:   04/08/21 0830  TempSrc: Temporal  PainSc: 0-No pain         Complications: No notable events documented.

## 2021-04-08 NOTE — Op Note (Signed)
[facility] Gastroenterology Patient Name: Ryan English Procedure Date: 04/08/2021 8:00 AM MRN: [id number] Account #: [id number] Date of Birth: 14-Sep-1950 Admit Type: Outpatient Age: 70 Room: [facility] ENDO ROOM 4 Gender: Male Note Status: Finalized Instrument Name: Park Meo [id number] Procedure:             Colonoscopy Indications:           Screening for colorectal malignant neoplasm, Last                         colonoscopy 10 years ago Providers:             Lin Landsman MD, MD Referring MD:          Dionne Bucy. Bacigalupo (Referring MD) Medicines:             General Anesthesia Complications:         No immediate complications. Estimated blood loss: None. Procedure:             Pre-Anesthesia Assessment:                        - Prior to the procedure, a History and Physical was                         performed, and patient medications and allergies were                         reviewed. The patient is competent. The risks and                         benefits of the procedure and the sedation options and                         risks were discussed with the patient. All questions                         were answered and informed consent was obtained.                         Patient identification and proposed procedure were                         verified by the physician, the nurse, the                         anesthesiologist, the anesthetist and the technician                         in the pre-procedure area in the procedure room in the                         endoscopy suite. Mental Status Examination: alert and                         oriented. Airway Examination: normal oropharyngeal                         airway and neck mobility. Respiratory Examination:  clear to auscultation. CV Examination: normal.                         Prophylactic Antibiotics: The patient does not require                         prophylactic  antibiotics. Prior Anticoagulants: The                         patient has taken no previous anticoagulant or                         antiplatelet agents. ASA Grade Assessment: II - A                         patient with mild systemic disease. After reviewing                         the risks and benefits, the patient was deemed in                         satisfactory condition to undergo the procedure. The                         anesthesia plan was to use general anesthesia.                         Immediately prior to administration of medications,                         the patient was re-assessed for adequacy to receive                         sedatives. The heart rate, respiratory rate, oxygen                         saturations, blood pressure, adequacy of pulmonary                         ventilation, and response to care were monitored                         throughout the procedure. The physical status of the                         patient was re-assessed after the procedure.                        After obtaining informed consent, the colonoscope was                         passed under direct vision. Throughout the procedure,                         the patient's blood pressure, pulse, and oxygen                         saturations were monitored continuously. The  Colonoscope was introduced through the anus and                         advanced to the the cecum, identified by appendiceal                         orifice and ileocecal valve. The colonoscopy was                         performed without difficulty. The patient tolerated                         the procedure well. The quality of the bowel                         preparation was evaluated using the BBPS [facility] Bowel                         Preparation Scale) with scores of: Right Colon = 3,                         Transverse Colon = 3 and Left Colon = 3 (entire mucosa                          seen well with no residual staining, small fragments                         of stool or opaque liquid). The total BBPS score                         equals 9. Findings:      The perianal and digital rectal examinations were normal. Pertinent       negatives include normal sphincter tone and no palpable rectal lesions.      Four sessile polyps were found in the descending colon 3 and ascending       colon 1. The polyps were 3 to 5 mm in size. These polyps were removed       with a cold snare. Resection and retrieval were complete.      Multiple small-mouthed diverticula were found in the sigmoid colon.      Non-bleeding external hemorrhoids were found during retroflexion. The       hemorrhoids were small.      The exam was otherwise without abnormality. Impression:            - Four 3 to 5 mm polyps in the descending colon and in                         the ascending colon, removed with a cold snare.                         Resected and retrieved.                        - Diverticulosis in the sigmoid colon.                        - Non-bleeding external hemorrhoids.                        -  The examination was otherwise normal. Recommendation:        - Discharge patient to home (with escort).                        - Resume previous diet today.                        - Continue present medications.                        - Await pathology results.                        - Repeat colonoscopy in 5 years for surveillance. Procedure Code(s):     --- Professional ---                        [phone], Colonoscopy, flexible; with removal of                         tumor(s), polyp(s), or other lesion(s) by snare                         technique Diagnosis Code(s):     --- Professional ---                        Z12.11, Encounter for screening for malignant neoplasm                         of colon                        K63.5, Polyp of colon                        K64.4, Residual hemorrhoidal  skin tags                        K57.30, Diverticulosis of large intestine without                         perforation or abscess without bleeding CPT copyright 2019 American Medical Association. All rights reserved. The codes documented in this report are preliminary and upon coder review may  be revised to meet current compliance requirements. Dr. Ulyess Mort Lin Landsman MD, MD 04/08/2021 9:21:39 AM This report has been signed electronically. Number of Addenda: 0 Note Initiated On: 04/08/2021 8:00 AM Scope Withdrawal Time: 0 hours 15 minutes 9 seconds  Total Procedure Duration: 0 hours 20 minutes 47 seconds  Estimated Blood Loss:  Estimated blood loss: none.      [facility]

## 2021-04-08 NOTE — Anesthesia Preprocedure Evaluation (Signed)
Anesthesia Evaluation  Patient identified by MRN, date of birth, ID band Patient awake    Reviewed: Allergy & Precautions, H&P , NPO status , Patient's Chart, lab work & pertinent test results, reviewed documented beta blocker date and time   Airway Mallampati: II  TM Distance: >3 FB Neck ROM: full    Dental  (+) Dental Advidsory Given, Teeth Intact, Caps   Pulmonary neg pulmonary ROS,    Pulmonary exam normal breath sounds clear to auscultation       Cardiovascular Exercise Tolerance: Good negative cardio ROS Normal cardiovascular exam Rhythm:regular Rate:Normal     Neuro/Psych negative neurological ROS  negative psych ROS   GI/Hepatic Neg liver ROS, GERD  ,  Endo/Other  negative endocrine ROS  Renal/GU negative Renal ROS  negative genitourinary   Musculoskeletal   Abdominal   Peds  Hematology negative hematology ROS (+)   Anesthesia Other Findings Past Medical History: No date: Arthritis No date: GERD (gastroesophageal reflux disease) No date: Hyperlipidemia   Reproductive/Obstetrics negative OB ROS                             Anesthesia Physical Anesthesia Plan  ASA: 2  Anesthesia Plan: General   Post-op Pain Management:    Induction: Intravenous  PONV Risk Score and Plan: 2 and TIVA and Propofol infusion  Airway Management Planned: Natural Airway and Nasal Cannula  Additional Equipment:   Intra-op Plan:   Post-operative Plan:   Informed Consent: I have reviewed the patients History and Physical, chart, labs and discussed the procedure including the risks, benefits and alternatives for the proposed anesthesia with the patient or authorized representative who has indicated his/her understanding and acceptance.     Dental Advisory Given  Plan Discussed with: Anesthesiologist, CRNA and Surgeon  Anesthesia Plan Comments:         Anesthesia Quick Evaluation

## 2021-04-08 NOTE — H&P (Signed)
Cephas Darby, MD [address]  [place]  [place], [place] [address]  Main: [phone]  Fax: [phone] Pager: [phone]  Primary Care Physician:  Virginia Crews, MD Primary Gastroenterologist:  Dr. Cephas Darby  Pre-Procedure History & Physical: HPI:  Ryan English is a 70 y.o. male is here for an colonoscopy.   Past Medical History:  Diagnosis Date   Arthritis    GERD (gastroesophageal reflux disease)    Hyperlipidemia     Past Surgical History:  Procedure Laterality Date   APPENDECTOMY  1999    Prior to Admission medications   Medication Sig Start Date End Date Taking? Authorizing Provider  acetaminophen (TYLENOL) 500 MG tablet Take 1,000 mg by mouth.    [provider]  aspirin 81 MG tablet Take 81 mg by mouth daily.    [provider]  atorvastatin (LIPITOR) 40 MG tablet Take 1 tablet by mouth daily. 02/20/13   [provider]  fluticasone (FLONASE) 50 MCG/ACT nasal spray SPRAY 2 SPRAYS INTO EACH NOSTRIL EVERY DAY 03/31/21   Bacigalupo, Dionne Bucy, MD  Misc Natural Products (OSTEO BI-FLEX JOINT SHIELD PO) Take 2 tablets by mouth daily.    [provider]  montelukast (SINGULAIR) 10 MG tablet TAKE 1 TABLET BY MOUTH EVERYDAY AT BEDTIME 03/23/21   Virginia Crews, MD    Allergies as of 02/23/2021 - Review Complete 02/02/2021  Allergen Reaction Noted   Other  08/01/2018    Family History  Problem Relation Age of Onset   Cancer Mother    Diabetes Sister    Heart disease Brother        MI   Diabetes Sister    Heart disease Brother    Diabetes Brother    Diabetes Brother     Social History   Socioeconomic History   Marital status: Married    Spouse name: Not on file   Number of children: 2   Years of education: Not on file   Highest education level: Not on file  Occupational History   Not on file  Tobacco Use   Smoking status: Never   Smokeless tobacco: Never  Vaping Use   Vaping Use: Never  used  Substance and Sexual Activity   Alcohol use: Yes    Alcohol/week: 6.0 standard drinks    Types: 6 Cans of beer per week    Comment: occasionally   Drug use: No   Sexual activity: Not on file  Other Topics Concern   Not on file  Social History Narrative   ** Merged History Encounter **       Social Determinants of Health   Financial Resource Strain: Not on file  Food Insecurity: Not on file  Transportation Needs: Not on file  Physical Activity: Not on file  Stress: Not on file  Social Connections: Not on file  Intimate Partner Violence: Not on file    Review of Systems: See HPI, otherwise negative ROS  Physical Exam: BP 122/87   Pulse 65   Temp (!) 96.7 F (35.9 C) (Temporal)   Resp 18   Ht 6' (1.829 m)   Wt 91.2 kg   SpO2 97%   BMI 27.26 kg/m  General:   Alert,  pleasant and cooperative in NAD Head:  Normocephalic and atraumatic. Neck:  Supple; no masses or thyromegaly. Lungs:  Clear throughout to auscultation.    Heart:  Regular rate and rhythm. Abdomen:  Soft, nontender and nondistended. Normal bowel sounds, without  guarding, and without rebound.   Neurologic:  Alert and  oriented x4;  grossly normal neurologically.  Impression/Plan: Albaro L Newey is here for an colonoscopy to be performed for colon cancer screening  Risks, benefits, limitations, and alternatives regarding  colonoscopy have been reviewed with the patient.  Questions have been answered.  All parties agreeable.   Sherri Sear, MD  04/08/2021, 8:36 AM

## 2021-04-09 ENCOUNTER — Encounter: Payer: Self-pay | Admitting: Gastroenterology

## 2021-04-09 LAB — SURGICAL PATHOLOGY

## 2021-04-09 NOTE — Progress Notes (Signed)
Benign lesions on colonoscopy; repeat in 5 years or if symptoms arise prior.  Thanks EP

## 2021-04-10 ENCOUNTER — Encounter: Payer: Self-pay | Admitting: Gastroenterology

## 2021-04-10 NOTE — Anesthesia Postprocedure Evaluation (Signed)
Anesthesia Post Note  [name]  Procedure(s) Performed: COLONOSCOPY WITH PROPOFOL  Patient location during evaluation: Endoscopy Anesthesia Type: General Level of consciousness: awake and alert Pain management: pain level controlled Vital Signs Assessment: post-procedure vital signs reviewed and stable Respiratory status: spontaneous breathing, nonlabored ventilation, respiratory function stable and patient connected to nasal cannula oxygen Cardiovascular status: blood pressure returned to baseline and stable Postop Assessment: no apparent nausea or vomiting Anesthetic complications: no   No notable events documented.   Last Vitals:  Vitals:   04/08/21 0946 04/08/21 0951  BP:  118/79  Pulse:    Resp: 16   Temp:    SpO2:      Last Pain:  Vitals:   04/09/21 0729  TempSrc:   PainSc: 0-No pain                 Martha Clan

## 2021-04-11 DIAGNOSIS — M1712 Unilateral primary osteoarthritis, left knee: Secondary | ICD-10-CM | POA: Insufficient documentation

## 2021-04-17 ENCOUNTER — Telehealth: Payer: Self-pay | Admitting: *Deleted

## 2021-04-17 NOTE — Telephone Encounter (Signed)
Benign lesions on colonoscopy; repeat in 5 years or if symptoms arise prior.   Thanks EP  Patient called for results- advised of above.

## 2021-04-30 ENCOUNTER — Ambulatory Visit: Payer: Medicare HMO | Admitting: Adult Health

## 2021-06-04 DIAGNOSIS — I2510 Atherosclerotic heart disease of native coronary artery without angina pectoris: Secondary | ICD-10-CM | POA: Diagnosis not present

## 2021-06-04 DIAGNOSIS — E782 Mixed hyperlipidemia: Secondary | ICD-10-CM | POA: Diagnosis not present

## 2021-06-15 DIAGNOSIS — M25552 Pain in left hip: Secondary | ICD-10-CM | POA: Diagnosis not present

## 2021-06-15 DIAGNOSIS — M25551 Pain in right hip: Secondary | ICD-10-CM | POA: Diagnosis not present

## 2021-06-15 DIAGNOSIS — M5416 Radiculopathy, lumbar region: Secondary | ICD-10-CM | POA: Diagnosis not present

## 2021-06-17 ENCOUNTER — Other Ambulatory Visit: Payer: Self-pay | Admitting: Family Medicine

## 2021-06-17 DIAGNOSIS — J3089 Other allergic rhinitis: Secondary | ICD-10-CM

## 2021-06-18 NOTE — Telephone Encounter (Signed)
Requested Prescriptions  Pending Prescriptions Disp Refills   montelukast (SINGULAIR) 10 MG tablet [Pharmacy Med Name: MONTELUKAST SOD 10 MG TABLET] 90 tablet 0    Sig: TAKE 1 TABLET BY MOUTH EVERYDAY AT BEDTIME     Pulmonology:  Leukotriene Inhibitors Passed - 06/17/2021  3:36 PM      Passed - Valid encounter within last 12 months    Recent Outpatient Visits          4 months ago Encounter for annual wellness visit (AWV) in Medicare patient   [facility] [place], Dionne Bucy, MD   11 months ago Acute non-recurrent pansinusitis   [place], [place]   1 year ago Upper respiratory tract infection, unspecified type   [facility] [place], Wendee Beavers, PA-C   1 year ago Annual physical exam   [place], Clearnce Sorrel, [place]   2 years ago Encounter for annual physical exam   [facility], Dionne Bucy, MD

## 2021-07-09 ENCOUNTER — Ambulatory Visit: Payer: Self-pay | Admitting: *Deleted

## 2021-07-09 ENCOUNTER — Telehealth: Payer: Self-pay | Admitting: Family Medicine

## 2021-07-09 ENCOUNTER — Telehealth: Payer: Medicare HMO | Admitting: Physician Assistant

## 2021-07-09 DIAGNOSIS — J209 Acute bronchitis, unspecified: Secondary | ICD-10-CM

## 2021-07-09 MED ORDER — BENZONATATE 100 MG PO CAPS: 100.0000 mg | ORAL_CAPSULE | Freq: Three times a day (TID) | ORAL | 0 refills | Status: DC | PRN

## 2021-07-09 MED ORDER — DOXYCYCLINE HYCLATE 100 MG PO TABS: 100.0000 mg | ORAL_TABLET | Freq: Two times a day (BID) | ORAL | 0 refills | Status: DC

## 2021-07-09 NOTE — Telephone Encounter (Signed)
Attempted call NA voicemail not set up. Called home phone number and spoke with his wife. She stated that last thing he told her was that he was going to do a virtual visit on [email or web address].

## 2021-07-09 NOTE — Patient Instructions (Signed)
Ryan English, thank you for joining Ryan Rio, PA-C for today's virtual visit.  While this provider is not your primary care provider (PCP), if your PCP is located in our provider database this encounter information will be shared with them immediately following your visit.  Consent: (Patient) Ryan English provided verbal consent for this virtual visit at the beginning of the encounter.  Current Medications:  Current Outpatient Medications:    acetaminophen (TYLENOL) 500 MG tablet, Take 1,000 mg by mouth., Disp: , Rfl:    aspirin 81 MG tablet, Take 81 mg by mouth daily., Disp: , Rfl:    atorvastatin (LIPITOR) 40 MG tablet, Take 1 tablet by mouth daily., Disp: , Rfl:    fluticasone (FLONASE) 50 MCG/ACT nasal spray, SPRAY 2 SPRAYS INTO EACH NOSTRIL EVERY DAY, Disp: 48 mL, Rfl: 2   Misc Natural Products (OSTEO BI-FLEX JOINT SHIELD PO), Take 2 tablets by mouth daily., Disp: , Rfl:    montelukast (SINGULAIR) 10 MG tablet, TAKE 1 TABLET BY MOUTH EVERYDAY AT BEDTIME, Disp: 90 tablet, Rfl: 0   Medications ordered in this encounter:  No orders of the defined types were placed in this encounter.    *If you need refills on other medications prior to your next appointment, please contact your pharmacy*  Follow-Up: Call back or seek an in-person evaluation if the symptoms worsen or if the condition fails to improve as anticipated.  Other Instructions Take antibiotic (Doxycycline) as directed.  Increase fluids.  Get plenty of rest. Use Mucinex for congestion. Tessalon as directed for cough. Take a daily probiotic (I recommend Align or Culturelle, but even Activia Yogurt may be beneficial).  A humidifier placed in the bedroom may offer some relief for a dry, scratchy throat of nasal irritation.  Read information below on acute bronchitis. Please call or return to clinic if symptoms are not improving.  Acute Bronchitis Bronchitis is when the airways that extend from the windpipe into  the lungs get red, puffy, and painful (inflamed). Bronchitis often causes thick spit (mucus) to develop. This leads to a cough. A cough is the most common symptom of bronchitis. In acute bronchitis, the condition usually begins suddenly and goes away over time (usually in 2 weeks). Smoking, allergies, and asthma can make bronchitis worse. Repeated episodes of bronchitis may cause more lung problems.  HOME CARE Rest. Drink enough fluids to keep your pee (urine) clear or pale yellow (unless you need to limit fluids as told by your doctor). Only take over-the-counter or prescription medicines as told by your doctor. Avoid smoking and secondhand smoke. These can make bronchitis worse. If you are a smoker, think about using nicotine gum or skin patches. Quitting smoking will help your lungs heal faster. Reduce the chance of getting bronchitis again by: Washing your hands often. Avoiding people with cold symptoms. Trying not to touch your hands to your mouth, nose, or eyes. Follow up with your doctor as told.  GET HELP IF: Your symptoms do not improve after 1 week of treatment. Symptoms include: Cough. Fever. Coughing up thick spit. Body aches. Chest congestion. Chills. Shortness of breath. Sore throat.  GET HELP RIGHT AWAY IF:  You have an increased fever. You have chills. You have severe shortness of breath. You have bloody thick spit (sputum). You throw up (vomit) often. You lose too much body fluid (dehydration). You have a severe headache. You faint.  MAKE SURE YOU:  Understand these instructions. Will watch your condition. Will get help right  away if you are not doing well or get worse. Document Released: 12/08/2007 Document Revised: 02/21/2013 Document Reviewed: 12/12/2012 [facility] Patient Information 2015 [place], [place]. This information is not intended to replace advice given to you by your health care provider. Make sure you discuss any questions you have with your health  care provider.    If you have been instructed to have an in-person evaluation today at a local Urgent Care facility, please use the link below. It will take you to a list of all of our available [facility], including address, phone number and hours of operation. Please do not delay care.  [facility]  If you or a family member do not have a primary care provider, use the link below to schedule a visit and establish care. When you choose a [place] primary care physician or advanced practice provider, you gain a long-term partner in health. Find a Primary Care Provider  Learn more about [facility]'s in-office and virtual care options: [facility]

## 2021-07-09 NOTE — Telephone Encounter (Signed)
Summary: cough, congestion   Pt experiencing cough and congestion since 01/01 seeking medical advice on what medicine to take, theres no office appt until 01/10       Chief Complaint: requesting medication for productive cough Symptoms: coughing up dark green mucus, chest tightness from coughing, hoarseness, body aches  Frequency: since 07/05/21 Pertinent Negatives: Patient denies chest pain, difficulty breathing, no fever has not tested for covid  Disposition: [] ED /[x] Urgent Care (no appt availability in office) / [] Appointment(In office/virtual)/ []  [facility]/ [] Home Care/ [] Refused Recommended Disposition /[]  Mobile Bus/ []  Follow-up with PCP Additional Notes:  Recommended to take at home covid test and call clinic back. Has been taking mucinex D and vapor "humidifier" . No available appt until 07/13/21. Please advise .        Reason for Disposition  Cough has been present for > 3 weeks    Not present >3 weeks , cough productive green mucus  Answer Assessment - Initial Assessment Questions 1. ONSET: "When did the cough begin?"      07/05/21 2. SEVERITY: "How bad is the cough today?"      Coughing up dark green mucus  3. SPUTUM: "Describe the color of your sputum" (none, dry cough; clear, white, yellow, green)     Dark green  4. HEMOPTYSIS: "Are you coughing up any blood?" If so ask: "How much?" (flecks, streaks, tablespoons, etc.)     No  5. DIFFICULTY BREATHING: "Are you having difficulty breathing?" If Yes, ask: "How bad is it?" (e.g., mild, moderate, severe)    - MILD: No SOB at rest, mild SOB with walking, speaks normally in sentences, can lie down, no retractions, pulse < 100.    - MODERATE: SOB at rest, SOB with minimal exertion and prefers to sit, cannot lie down flat, speaks in phrases, mild retractions, audible wheezing, pulse 100-120.    - SEVERE: Very SOB at rest, speaks in single words, struggling to breathe, sitting hunched forward, retractions,  pulse > 120      Denies  6. FEVER: "Do you have a fever?" If Yes, ask: "What is your temperature, how was it measured, and when did it start?"     no 7. CARDIAC HISTORY: "Do you have any history of heart disease?" (e.g., heart attack, congestive heart failure)      na 8. LUNG HISTORY: "Do you have any history of lung disease?"  (e.g., pulmonary embolus, asthma, emphysema)     na 9. PE RISK FACTORS: "Do you have a history of blood clots?" (or: recent major surgery, recent prolonged travel, bedridden)     na 10. OTHER SYMPTOMS: "Do you have any other symptoms?" (e.g., runny nose, wheezing, chest pain)       Chest tightness, productive cough- dark green mucus 11. PREGNANCY: "Is there any chance you are pregnant?" "When was your last menstrual period?"       na 12. TRAVEL: "Have you traveled out of the country in the last month?" (e.g., travel history, exposures)       na  Protocols used: Cough - Acute Productive-A-AH

## 2021-07-09 NOTE — Progress Notes (Signed)
Virtual Visit Consent   Ryan English, you are scheduled for a virtual visit with a [place] provider today.     Just as with appointments in the office, your consent must be obtained to participate.  Your consent will be active for this visit and any virtual visit you may have with one of our providers in the next 365 days.     If you have a MyChart account, a copy of this consent can be sent to you electronically.  All virtual visits are billed to your insurance company just like a traditional visit in the office.    As this is a virtual visit, video technology does not allow for your provider to perform a traditional examination.  This may limit your provider's ability to fully assess your condition.  If your provider identifies any concerns that need to be evaluated in person or the need to arrange testing (such as labs, EKG, etc.), we will make arrangements to do so.     Although advances in technology are sophisticated, we cannot ensure that it will always work on either your end or our end.  If the connection with a video visit is poor, the visit may have to be switched to a telephone visit.  With either a video or telephone visit, we are not always able to ensure that we have a secure connection.     I need to obtain your verbal consent now.   Are you willing to proceed with your visit today?    Ryan English has provided verbal consent on 07/09/2021 for a virtual visit (video or telephone).   [name], [place]   Date: 07/09/2021 5:59 PM   Virtual Visit via Video Note   I, [name], connected with  Ryan English  ([id number], Ryan English, 2002) on 07/09/21 at  6:00 PM EST by a video-enabled telemedicine application and verified that I am speaking with the correct person using two identifiers.  Location: Patient: Virtual Visit Location Patient: Home Provider: Virtual Visit Location Provider: Home Office   I discussed the limitations of evaluation and management  by telemedicine and the availability of in person appointments. The patient expressed understanding and agreed to proceed.    History of Present Illness: Ryan English is a 71 y.o. who identifies as a male who was assigned male at birth, and is being seen today for URI symptoms starting almost a week ago. Initially with chest congestion and some nasal/sinus congestion with fatigue. Denies fever.. As of yesterday was noting a substantial increase in cough and congestion, now with thick green phlegm, chest tightness.  Notes starting a humidifier in the bedroom last night and some Vicks vapo-Rub which has helped somewhat. Took a home COVID test this morning that was negative. Denies any known recent travel or sick contact.    HPI: HPI  Problems:  Patient Active Problem List   Diagnosis Date Noted   Polyp of colon    Overweight 07/English/2020   Gastroesophageal reflux disease without esophagitis 06/24/2016   Degenerative arthritis of spine 11/06/2015   Neuralgia neuritis, sciatic nerve 11/06/2015   Lumbar canal stenosis 11/06/2015   SPL (spondylolisthesis) 11/06/2015   Adrenal adenoma 05/26/2015   Atherosclerosis of coronary artery 05/26/2015   Allergic rhinitis 04/09/2015   Bradycardia 08/02/2014   Combined fat and carbohydrate induced hyperlipemia 08/02/2014    Allergies: No Known Allergies Medications:  Current Outpatient Medications:    benzonatate (TESSALON) 100 MG capsule, Take 1 capsule (100  mg total) by mouth 3 (three) times daily as needed for cough., Disp: 30 capsule, Rfl: 0   doxycycline (VIBRA-TABS) 100 MG tablet, Take 1 tablet (100 mg total) by mouth 2 (two) times daily., Disp: 14 tablet, Rfl: 0   acetaminophen (TYLENOL) 500 MG tablet, Take 1,000 mg by mouth., Disp: , Rfl:    aspirin 81 MG tablet, Take 81 mg by mouth daily., Disp: , Rfl:    atorvastatin (LIPITOR) 40 MG tablet, Take 1 tablet by mouth daily., Disp: , Rfl:    fluticasone (FLONASE) 50 MCG/ACT nasal spray, SPRAY 2  SPRAYS INTO EACH NOSTRIL EVERY DAY, Disp: 48 mL, Rfl: 2   Misc Natural Products (OSTEO BI-FLEX JOINT SHIELD PO), Take 2 tablets by mouth daily., Disp: , Rfl:    montelukast (SINGULAIR) 10 MG tablet, TAKE 1 TABLET BY MOUTH EVERYDAY AT BEDTIME, Disp: 90 tablet, Rfl: 0  Observations/Objective: Patient is well-developed, well-nourished in no acute distress.  Resting comfortably at home.  Head is normocephalic, atraumatic.  No labored breathing. Speech is clear and coherent with logical content.  Patient is alert and oriented at baseline.   Assessment and Plan: 1. Acute bronchitis, unspecified organism - benzonatate (TESSALON) 100 MG capsule; Take 1 capsule (100 mg total) by mouth 3 (three) times daily as needed for cough.  Dispense: 30 capsule; Refill: 0 - doxycycline (VIBRA-TABS) 100 MG tablet; Take 1 tablet (100 mg total) by mouth 2 (two) times daily.  Dispense: 14 tablet; Refill: 0  Rx Doxycycline. Will have him hold off on starting to see if symptoms are turning the corner over the next 24 hours. If continuing to progress, he is to take as directed.   Increase fluids.  Rest.  Saline nasal spray.  Probiotic.  Mucinex as directed.  Humidifier in bedroom. Tessalon per orders.  Call or return to clinic if symptoms are not improving.   Follow Up Instructions: I discussed the assessment and treatment plan with the patient. The patient was provided an opportunity to ask questions and all were answered. The patient agreed with the plan and demonstrated an understanding of the instructions.  A copy of instructions were sent to the patient via MyChart unless otherwise noted below.   The patient was advised to call back or seek an in-person evaluation if the symptoms worsen or if the condition fails to improve as anticipated.  Time:  I spent 10 minutes with the patient via telehealth technology discussing the above problems/concerns.    [name], PA-C

## 2021-07-09 NOTE — Telephone Encounter (Signed)
Pt called in returning the nurse call. Pt says that he took a covid test as advised. Pt says that his results are negative. Pt would like further assistance.

## 2021-07-09 NOTE — Telephone Encounter (Signed)
Pt returned call, did covid home test, "Negative."  Declines UC as noted in previous encounter. Requesting "Something be called in for me." Advised would need to be seen. Advised virtual visit via [facility] platform, reviewed process with pt., verbalized understanding. States he is at work until 2. Requesting "Just ask the doctor and if cannot call in medication I'll do EVisit." Assured pt NT would route to practice for PCPs review and final disposition.

## 2021-07-09 NOTE — Telephone Encounter (Signed)
Please see other phone encounter

## 2021-07-09 NOTE — Telephone Encounter (Signed)
Can double book 8 am tomorrow with a virtual visit if he likes. Needs to be evaluated to see what is appropriate to "send in"

## 2021-07-10 NOTE — Telephone Encounter (Signed)
Patient did complete a video visit last night.

## 2021-09-13 ENCOUNTER — Other Ambulatory Visit: Payer: Self-pay | Admitting: Family Medicine

## 2021-09-13 DIAGNOSIS — J3089 Other allergic rhinitis: Secondary | ICD-10-CM

## 2021-09-14 NOTE — Telephone Encounter (Signed)
Requested Prescriptions  ?Pending Prescriptions Disp Refills  ?? montelukast (SINGULAIR) 10 MG tablet [Pharmacy Med Name: MONTELUKAST SOD 10 MG TABLET] 90 tablet 1  ?  Sig: TAKE 1 TABLET BY MOUTH EVERYDAY AT BEDTIME  ?  ? Pulmonology:  Leukotriene Inhibitors Failed - 09/13/2021  3:16 PM  ?  ?  Failed - Valid encounter within last 12 months  ?  Recent Outpatient Visits   ?      ? 7 months ago Encounter for annual wellness visit (AWV) in Medicare patient  ? [facility] [place], Dionne Bucy, [place]  ? 1 year ago Acute non-recurrent pansinusitis  ? [place], [place]  ? 1 year ago Upper respiratory tract infection, unspecified type  ? [facility] [place], Wendee Beavers, [place]  ? 1 year ago Annual physical exam  ? [facility] [place], Clearnce Sorrel, [place]  ? 2 years ago Encounter for annual physical exam  ? [facility], Dionne Bucy, MD  ?  ?  ? ?  ?  ?  ? ?

## 2021-09-15 ENCOUNTER — Ambulatory Visit: Payer: Self-pay | Admitting: *Deleted

## 2021-09-15 ENCOUNTER — Other Ambulatory Visit: Payer: Self-pay

## 2021-09-15 ENCOUNTER — Encounter: Payer: Self-pay | Admitting: Physician Assistant

## 2021-09-15 ENCOUNTER — Ambulatory Visit (INDEPENDENT_AMBULATORY_CARE_PROVIDER_SITE_OTHER): Payer: Medicare HMO | Admitting: Physician Assistant

## 2021-09-15 VITALS — BP 137/79 | HR 63 | Temp 97.9°F | Resp 16 | Wt 212.9 lb

## 2021-09-15 DIAGNOSIS — J069 Acute upper respiratory infection, unspecified: Secondary | ICD-10-CM | POA: Diagnosis not present

## 2021-09-15 NOTE — Progress Notes (Signed)
?  ? ? ?Established patient visit ? ? ?[name]   DOB: Jun 27, 1951   71 y.o. Male  MRN: [id number] ?Visit Date: 09/15/2021 ? ?Today's healthcare provider: Mardene Speak, PA-C  ? ?Chief Complaint  ?Patient presents with  ? Cough  ? ?Subjective  ?  ?Cough ?This is a new problem. The current episode started in the past 7 days. The problem has been unchanged. The cough is Productive of purulent sputum. Associated symptoms include nasal congestion, postnasal drip, rhinorrhea and a sore throat (described as scratchy). Pertinent negatives include no chest pain, chills, ear congestion, ear pain, fever, headaches, heartburn, hemoptysis, myalgias, rash, sweats, weight loss or wheezing. Nothing aggravates the symptoms. Treatments tried: Mucinex D. The treatment provided no relief.   ?Low grade fever in the evenings. No hx of asthma or COPD. No hx of smoking. ?Has a hx of seasonal allergies. ? ? ?Medications: ?Outpatient Medications Prior to Visit  ?Medication Sig  ? acetaminophen (TYLENOL) 500 MG tablet Take 1,000 mg by mouth.  ? aspirin 81 MG tablet Take 81 mg by mouth daily.  ? atorvastatin (LIPITOR) 40 MG tablet Take 1 tablet by mouth daily.  ? FAMOTIDINE PO Take by mouth.  ? fluticasone (FLONASE) 50 MCG/ACT nasal spray SPRAY 2 SPRAYS INTO EACH NOSTRIL EVERY DAY  ? Glucosamine-Chondroitin (OSTEO BI-FLEX REGULAR STRENGTH PO) Take by mouth 2 (two) times daily.  ? Misc Natural Products (OSTEO BI-FLEX JOINT SHIELD PO) Take 2 tablets by mouth daily.  ? montelukast (SINGULAIR) 10 MG tablet TAKE 1 TABLET BY MOUTH EVERYDAY AT BEDTIME  ? Pseudoephedrine-guaiFENesin (MUCINEX D PO) Take by mouth.  ? benzonatate (TESSALON) 100 MG capsule Take 1 capsule (100 mg total) by mouth 3 (three) times daily as needed for cough.  ? doxycycline (VIBRA-TABS) 100 MG tablet Take 1 tablet (100 mg total) by mouth 2 (two) times daily.  ? ?No facility-administered medications prior to visit.  ? ? ?Review of Systems  ?Constitutional:   Negative for chills, fever and weight loss.  ?HENT:  Positive for postnasal drip, rhinorrhea and sore throat (described as scratchy). Negative for ear pain.   ?Respiratory:  Positive for cough and chest tightness (2/2 cough). Negative for hemoptysis and wheezing.   ?Cardiovascular:  Negative for chest pain.  ?Gastrointestinal:  Negative for heartburn.  ?Musculoskeletal:  Negative for myalgias.  ?Skin:  Negative for rash.  ?Neurological:  Negative for headaches.  ? ? ?  Objective  ?  ?BP 137/79   Pulse 63   Temp 97.9 ?F (36.6 ?C) (Oral)   Resp 16   Wt 212 lb 14.4 oz (96.6 kg)   SpO2 97%   BMI 28.87 kg/m?  ? ? ?Physical Exam ?Constitutional:   ?   General: He is in acute distress.  ?   Appearance: Normal appearance.  ?HENT:  ?   Head: Normocephalic and atraumatic.  ?   Nose: Congestion and rhinorrhea present.  ?   Mouth/Throat:  ?   Mouth: Mucous membranes are moist.  ?   Pharynx: Posterior oropharyngeal erythema (mild) present. No oropharyngeal exudate.  ?Eyes:  ?   Conjunctiva/sclera: Conjunctivae normal.  ?   Pupils: Pupils are equal, round, and reactive to light.  ?Cardiovascular:  ?   Rate and Rhythm: Normal rate and regular rhythm.  ?   Pulses: Normal pulses.  ?   Heart sounds: Normal heart sounds.  ?Pulmonary:  ?   Effort: Pulmonary effort is normal.  ?   Breath sounds: Normal breath sounds.  ?  Abdominal:  ?   General: Abdomen is flat. Bowel sounds are normal.  ?   Palpations: Abdomen is soft.  ?Neurological:  ?   Mental Status: He is alert.  ?  ? ? ? Assessment & Plan  ?  ? ? ?1. Upper respiratory tract infection, unspecified type ?- symptoms and exam c/w viral URI - no evidence of strep pharyngitis, CAP, AOM, bacterial sinusitis, or other bacterial infection ?- discussed symptomatic management, natural course, and return precautions ?   ?The patient was advised to call back or seek an in-person evaluation if the symptoms worsen or if the condition fails to improve as anticipated. ? ?I discussed the  assessment and treatment plan with the patient. The patient was provided an opportunity to ask questions and all were answered. The patient agreed with the plan and demonstrated an understanding of the instructions. ? ?The entirety of the information documented in the History of Present Illness, Review of Systems and Physical Exam were personally obtained by me. Portions of this information were initially documented by the CMA and reviewed by me for thoroughness and accuracy.  ? ? ?[employer] as a Education administrator for [employer], PA-C.,have documented all relevant documentation on the behalf of Mardene Speak, PA-C,as directed by  [employer], PA-C while in the presence of [employer], PA-C. ? ? ?Mardene Speak, PA-C  ?[place] ?[phone] (phone) ?[phone] (fax) ? ?[facility] ?

## 2021-09-15 NOTE — Telephone Encounter (Signed)
?  Chief Complaint: cough ?Symptoms: green phlegm ?Frequency: all day ?Pertinent Negatives: Patient denies fever ?Disposition: '[]'$ ED /'[]'$ Urgent Care (no appt availability in office) / '[x]'$ Appointment(In office/virtual)/ '[]'$  [facility]/ '[]'$ Home Care/ '[]'$ Refused Recommended Disposition /'[]'$ [facility]/ '[]'$  Follow-up with PCP ?Additional Notes: Appt for this afternoon. ? ? ?Reason for Disposition ? Cough has been present for > 3 weeks ? ?Answer Assessment - Initial Assessment Questions ?1. ONSET: "When did the cough begin?"  ?    3 days ?2. SEVERITY: "How bad is the cough today?"  ?    ok ?3. SPUTUM: "Describe the color of your sputum" (none, dry cough; clear, white, yellow, green) ?    green ?4. HEMOPTYSIS: "Are you coughing up any blood?" If so ask: "How much?" (flecks, streaks, tablespoons, etc.) ?    no ?5. DIFFICULTY BREATHING: "Are you having difficulty breathing?" If Yes, ask: "How bad is it?" (e.g., mild, moderate, severe)  ?  - MILD: No SOB at rest, mild SOB with walking, speaks normally in sentences, can lie down, no retractions, pulse < 100.  ?  - MODERATE: SOB at rest, SOB with minimal exertion and prefers to sit, cannot lie down flat, speaks in phrases, mild retractions, audible wheezing, pulse 100-120.  ?  - SEVERE: Very SOB at rest, speaks in single words, struggling to breathe, sitting hunched forward, retractions, pulse > 120  ?    no ?6. FEVER: "Do you have a fever?" If Yes, ask: "What is your temperature, how was it measured, and when did it start?" ?    no ?7. CARDIAC HISTORY: "Do you have any history of heart disease?" (e.g., heart attack, congestive heart failure)  ?    no ?8. LUNG HISTORY: "Do you have any history of lung disease?"  (e.g., pulmonary embolus, asthma, emphysema) ?    no ?9. PE RISK FACTORS: "Do you have a history of blood clots?" (or: recent major surgery, recent prolonged travel, bedridden) ?    no ?10. OTHER SYMPTOMS: "Do you have any other symptoms?" (e.g., runny  nose, wheezing, chest pain) ?      no ?11. PREGNANCY: "Is there any chance you are pregnant?" "When was your last menstrual period?" ?      no ?12. TRAVEL: "Have you traveled out of the country in the last month?" (e.g., travel history, exposures) ?      na ? ?Protocols used: Cough - Acute Productive-A-AH ? ?

## 2021-09-15 NOTE — Telephone Encounter (Addendum)
Summary: cough and congestion / rx req  ? The patient has experienced congestion and cough for roughly three days  ?The patient has experienced no fever, vomiting or nausea  ?The patient has tested for negative for COVID 19  ?The patient would like to be prescribed something for their discomfort  ?Please contact further when possible   ?  ?Called pt, message left. ?Second call made. Will try pt one more time. ?

## 2021-09-22 ENCOUNTER — Ambulatory Visit: Payer: Self-pay

## 2021-09-22 NOTE — Telephone Encounter (Signed)
?  Chief Complaint: nasal discharge ?Symptoms: blood streaks when blowing nose, still has congestion and cough, gets worse in evenings ?Frequency: 1 week or more ?Pertinent Negatives: Patient denies fever or SOB ?Disposition: '[]'$ ED /'[]'$ Urgent Care (no appt availability in office) / '[]'$ Appointment(In office/virtual)/ '[]'$  [facility]/ '[]'$ Home Care/ '[]'$ Refused Recommended Disposition /'[]'$  Mobile Bus/ '[x]'$  Follow-up with PCP ?Additional Notes: Pt was seen last week by Letitia Libra, PA and advised if still had blood discharge to f/up. Pt called in and reported still having symptoms above. I advised him I will send to provider and if need to call back someone will give him a call. Pt verbalized understanding.  ? ? ?Reason for Disposition ? [1] Nasal discharge AND [2] present > 10 days ? ?Answer Assessment - Initial Assessment Questions ?2. ONSET: "When did the sinus pain start?"  (e.g., hours, days)  ?    More than 1 week ?5. NASAL CONGESTION: "Is the nose blocked?" If Yes, ask: "Can you open it or must you breathe through your mouth?" ?    Yes, stopped up but can breathe out of it ?6. NASAL DISCHARGE: "Do you have discharge from your nose?" If so ask, "What color?" ?    Yes bloody light pink ?7. FEVER: "Do you have a fever?" If Yes, ask: "What is it, how was it measured, and when did it start?"  ?    Not sure but feels warm in evenings ?8. OTHER SYMPTOMS: "Do you have any other symptoms?" (e.g., sore throat, cough, earache, difficulty breathing) ?    Feels worse in evening with congestion and cough ? ?Protocols used: Sinus Pain or Congestion-A-AH ? ?

## 2021-09-22 NOTE — Telephone Encounter (Signed)
Follow-up message to your recent visit

## 2021-10-07 ENCOUNTER — Ambulatory Visit (INDEPENDENT_AMBULATORY_CARE_PROVIDER_SITE_OTHER): Payer: Medicare HMO | Admitting: Family Medicine

## 2021-10-07 ENCOUNTER — Ambulatory Visit: Payer: Self-pay | Admitting: *Deleted

## 2021-10-07 ENCOUNTER — Encounter: Payer: Self-pay | Admitting: Family Medicine

## 2021-10-07 VITALS — BP 127/83 | HR 77 | Temp 97.7°F | Resp 16 | Wt 212.8 lb

## 2021-10-07 DIAGNOSIS — R0982 Postnasal drip: Secondary | ICD-10-CM | POA: Insufficient documentation

## 2021-10-07 DIAGNOSIS — J0101 Acute recurrent maxillary sinusitis: Secondary | ICD-10-CM | POA: Diagnosis not present

## 2021-10-07 MED ORDER — AMOXICILLIN-POT CLAVULANATE 875-125 MG PO TABS: 1.0000 | ORAL_TABLET | Freq: Two times a day (BID) | ORAL | 0 refills | Status: DC

## 2021-10-07 MED ORDER — LORATADINE 10 MG PO TABS: 10.0000 mg | ORAL_TABLET | Freq: Every day | ORAL | 11 refills | Status: DC

## 2021-10-07 NOTE — Telephone Encounter (Signed)
Summary: drainage  ? Pt has drainage and would like an antibiotic /pt  was seen by a PA a couple weeks ago/ but drainage has come back and keeping him up at night / he stated he wasn't given any meds at the appt because they wanted to wait and see If he gets better / he  also stated that he has a slight fever  /please advise   ?  ? ?

## 2021-10-07 NOTE — Progress Notes (Signed)
?  ? ?I,Sulibeya S Dimas,acting as a scribe for Gwyneth Sprout, FNP.,have documented all relevant documentation on the behalf of Gwyneth Sprout, FNP,as directed by  Gwyneth Sprout, FNP while in the presence of Gwyneth Sprout, FNP. ? ? ?Established patient visit ? ? ?[name]   DOB: 1951-05-06   71 y.o. Male  MRN: [id number] ?Visit Date: 10/07/2021 ? ?Today's healthcare provider: Gwyneth Sprout, FNP  ? ?Introduced to Designer, jewellery role and practice setting.  All questions answered.  Discussed provider/patient relationship and expectations. ? ? ?Chief Complaint  ?Patient presents with  ? URI  ? ?Subjective  ?  ?HPI  ?Upper respiratory symptoms ?He complains of cough described as nonproductive, post nasal drip, and sore throat.with no fever, chills, night sweats or weight loss. Onset of symptoms was  10/05/21  and gradually worsening.He is drinking plenty of fluids.  Past history is significant for no history of pneumonia or bronchitis. Patient is non-smoker.  ? ?--------------------------------------------------------------------------------------------------- ? ? ?Medications: ?Outpatient Medications Prior to Visit  ?Medication Sig  ? acetaminophen (TYLENOL) 500 MG tablet Take 1,000 mg by mouth.  ? aspirin 81 MG tablet Take 81 mg by mouth daily.  ? atorvastatin (LIPITOR) 40 MG tablet Take 1 tablet by mouth daily.  ? FAMOTIDINE PO Take by mouth.  ? fluticasone (FLONASE) 50 MCG/ACT nasal spray SPRAY 2 SPRAYS INTO EACH NOSTRIL EVERY DAY  ? Glucosamine-Chondroitin (OSTEO BI-FLEX REGULAR STRENGTH PO) Take by mouth 2 (two) times daily.  ? Misc Natural Products (OSTEO BI-FLEX JOINT SHIELD PO) Take 2 tablets by mouth daily.  ? montelukast (SINGULAIR) 10 MG tablet TAKE 1 TABLET BY MOUTH EVERYDAY AT BEDTIME  ? Pseudoephedrine-guaiFENesin (MUCINEX D PO) Take by mouth.  ? ?No facility-administered medications prior to visit.  ? ? ?Review of Systems  ?Constitutional:  Negative for appetite change, chills and fever.   ?HENT:  Positive for postnasal drip, rhinorrhea and sore throat. Negative for sinus pressure and sinus pain.   ?Respiratory:  Positive for cough and shortness of breath. Negative for wheezing.   ?Neurological:  Positive for headaches.  ? ?  ?  Objective  ?  ?BP 127/83 (BP Location: Left Arm, Patient Position: Sitting, Cuff Size: Large)   Pulse 77   Temp 97.7 ?F (36.5 ?C) (Oral)   Resp 16   Wt 212 lb 12.8 oz (96.5 kg)   SpO2 99%   BMI 28.86 kg/m?  ?BP Readings from Last 3 Encounters:  ?10/07/21 127/83  ?09/15/21 137/79  ?04/08/21 118/79  ? ?Wt Readings from Last 3 Encounters:  ?10/07/21 212 lb 12.8 oz (96.5 kg)  ?09/15/21 212 lb 14.4 oz (96.6 kg)  ?04/08/21 201 lb (91.2 kg)  ? ?  ? ?Physical Exam ?Vitals and nursing note reviewed.  ?Constitutional:   ?   General: He is not in acute distress. ?   Appearance: Normal appearance. He is overweight. He is not ill-appearing, toxic-appearing or diaphoretic.  ?HENT:  ?   Head: Normocephalic and atraumatic.  ?   Right Ear: Tympanic membrane, ear canal and external ear normal. There is no impacted cerumen.  ?   Left Ear: Tympanic membrane, ear canal and external ear normal. There is no impacted cerumen.  ?   Nose: Congestion and rhinorrhea present. Rhinorrhea is purulent.  ?   Right Sinus: Maxillary sinus tenderness present. No frontal sinus tenderness.  ?   Left Sinus: Maxillary sinus tenderness present. No frontal sinus tenderness.  ?   Mouth/Throat:  ?  Mouth: Mucous membranes are moist.  ?   Pharynx: Posterior oropharyngeal erythema present. No oropharyngeal exudate.  ?Eyes:  ?   Pupils: Pupils are equal, round, and reactive to light.  ?Cardiovascular:  ?   Rate and Rhythm: Normal rate and regular rhythm.  ?   Pulses: Normal pulses.  ?   Heart sounds: Normal heart sounds.  ?Pulmonary:  ?   Effort: Pulmonary effort is normal.  ?   Breath sounds: Normal breath sounds.  ?Musculoskeletal:     ?   General: Normal range of motion.  ?   Cervical back: Normal range of  motion.  ?Skin: ?   General: Skin is warm and dry.  ?   Capillary Refill: Capillary refill takes less than 2 seconds.  ?Neurological:  ?   General: No focal deficit present.  ?   Mental Status: He is alert and oriented to person, place, and time. Mental status is at baseline.  ?Psychiatric:     ?   Mood and Affect: Mood normal.     ?   Behavior: Behavior normal.     ?   Thought Content: Thought content normal.     ?   Judgment: Judgment normal.  ?  ?No results found for any visits on 10/07/21. ? Assessment & Plan  ?  ? ?Problem List Items Addressed This Visit   ? ?  ? Respiratory  ? Acute recurrent maxillary sinusitis - Primary  ?  Hx of sinusitis in years past ?Has been treated for bronchitis this Winter ?Has been treated with OTC guaifenesin to assist ?Worsening symptoms in past 2 weeks; had improved briefly ?Recommend regular use of flonase ?Continue use of singulair ?Start Abx, augmentin BID for 10 days ?Add claritin to assist with PND if allergy symptoms as well; low suspicion given pt works indoors  ?RTC in 2 wks if no improvement; will send for CXR and refer to ENT ?  ?  ? Relevant Medications  ? amoxicillin-clavulanate (AUGMENTIN) 875-125 MG tablet  ? loratadine (CLARITIN) 10 MG tablet  ?  ? Other  ? PND (post-nasal drip)  ?  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  ?  ? Relevant Medications  ? loratadine (CLARITIN) 10 MG tablet  ? ? ? ?Return in about 2 weeks (around 10/21/2021), or if symptoms worsen or fail to improve.  ?   ? ?I, Gwyneth Sprout, FNP, have reviewed all documentation for this visit. The documentation on 10/07/21 for the exam, diagnosis, procedures, and orders are all accurate and complete. ? ? ? ?Gwyneth Sprout, FNP   ?[place] ?[phone] (phone) ?[phone] (fax) ? ?[facility] ?

## 2021-10-07 NOTE — Assessment & Plan Note (Signed)
Hx of sinusitis in years past ?Has been treated for bronchitis this Winter ?Has been treated with OTC guaifenesin to assist ?Worsening symptoms in past 2 weeks; had improved briefly ?Recommend regular use of flonase ?Continue use of singulair ?Start Abx, augmentin BID for 10 days ?Add claritin to assist with PND if allergy symptoms as well; low suspicion given pt works indoors  ?RTC in 2 wks if no improvement; will send for CXR and refer to ENT ?

## 2021-10-07 NOTE — Assessment & Plan Note (Signed)
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

## 2021-10-07 NOTE — Telephone Encounter (Signed)
?  Chief Complaint: sinus drainage ?Symptoms: sinus drainage, sore throat ?Frequency: 2 days ?Pertinent Negatives: Patient denies cough, earache, difficulty breathing ?Disposition: '[]'$ ED /'[]'$ Urgent Care (no appt availability in office) / '[x]'$ Appointment(In office/virtual)/ '[]'$  [facility]/ '[]'$ Home Care/ '[]'$ Refused Recommended Disposition /'[]'$ [facility]/ '[]'$  Follow-up with PCP ?Additional Notes: Patient called back as requested by provider after visit 09/15/21 and did not get response back from office. See note 09/22/21  ? ?Reason for Disposition ? [1] Sinus congestion (pressure, fullness) AND [2] present > 10 days ? ?Answer Assessment - Initial Assessment Questions ?1. LOCATION: "Where does it hurt?"  ?    Slight pain in nasal passage ?2. ONSET: "When did the sinus pain start?"  (e.g., hours, days)  ?    2 days ?3. SEVERITY: "How bad is the pain?"   (Scale 1-10; mild, moderate or severe) ?  - MILD (1-3): doesn't interfere with normal activities  ?  - MODERATE (4-7): interferes with normal activities (e.g., work or school) or awakens from sleep ?  - SEVERE (8-10): excruciating pain and patient unable to do any normal activities    ?    mild ?4. RECURRENT SYMPTOM: "Have you ever had sinus problems before?" If Yes, ask: "When was the last time?" and "What happened that time?"  ?    Yes- patient had URI- 09/15/21- not treated ?5. NASAL CONGESTION: "Is the nose blocked?" If Yes, ask: "Can you open it or must you breathe through your mouth?" ?    Not blocked- mostly heavy drainage ?6. NASAL DISCHARGE: "Do you have discharge from your nose?" If so ask, "What color?" ?    Light yellow ?7. FEVER: "Do you have a fever?" If Yes, ask: "What is it, how was it measured, and when did it start?"  ?    May have slight fever- sluggish ?8. OTHER SYMPTOMS: "Do you have any other symptoms?" (e.g., sore throat, cough, earache, difficulty breathing) ?    Throat irritation from grainage- burning ?9. PREGNANCY: "Is there any  chance you are pregnant?" "When was your last menstrual period?" ?    *No Answer* ? ?Protocols used: Sinus Pain or Congestion-A-AH ? ?

## 2021-10-09 ENCOUNTER — Other Ambulatory Visit: Payer: Self-pay

## 2021-10-09 ENCOUNTER — Encounter: Payer: Self-pay | Admitting: Physician Assistant

## 2021-10-09 ENCOUNTER — Ambulatory Visit
Admission: RE | Admit: 2021-10-09 | Discharge: 2021-10-09 | Disposition: A | Discharge status: Home / Self Care | Payer: Medicare HMO | Source: Ambulatory Visit | Attending: Physician Assistant | Admitting: Physician Assistant

## 2021-10-09 ENCOUNTER — Ambulatory Visit (INDEPENDENT_AMBULATORY_CARE_PROVIDER_SITE_OTHER): Payer: Medicare HMO | Admitting: Physician Assistant

## 2021-10-09 ENCOUNTER — Ambulatory Visit: Payer: Self-pay

## 2021-10-09 ENCOUNTER — Ambulatory Visit
Admission: RE | Admit: 2021-10-09 | Discharge: 2021-10-09 | Disposition: A | Discharge status: Home / Self Care | Payer: Medicare HMO | Attending: Physician Assistant | Admitting: Physician Assistant

## 2021-10-09 VITALS — BP 133/82 | HR 80 | Temp 98.4°F | Resp 16 | Wt 209.0 lb

## 2021-10-09 DIAGNOSIS — R051 Acute cough: Secondary | ICD-10-CM

## 2021-10-09 DIAGNOSIS — R0789 Other chest pain: Secondary | ICD-10-CM

## 2021-10-09 DIAGNOSIS — J0101 Acute recurrent maxillary sinusitis: Secondary | ICD-10-CM | POA: Diagnosis not present

## 2021-10-09 DIAGNOSIS — R059 Cough, unspecified: Secondary | ICD-10-CM | POA: Diagnosis not present

## 2021-10-09 MED ORDER — ALBUTEROL SULFATE HFA 108 (90 BASE) MCG/ACT IN AERS: 2.0000 | INHALATION_SPRAY | Freq: Four times a day (QID) | RESPIRATORY_TRACT | 1 refills | Status: DC | PRN

## 2021-10-09 MED ORDER — SPACER/AERO-HOLDING CHAMBERS DEVI: 1.0000 | 0 refills | Status: DC | PRN

## 2021-10-09 NOTE — Telephone Encounter (Signed)
?  Chief Complaint: non-stop cough ?Symptoms: cough ?Frequency: a few days - getting worse. ?Pertinent Negatives: Patient denies. Chest pain ?Disposition: '[]'$ ED /'[]'$ Urgent Care (no appt availability in office) / '[x]'$ Appointment(In office/virtual)/ '[]'$  [facility]/ '[]'$ Home Care/ '[]'$ Refused Recommended Disposition /'[]'$ [facility]/ '[]'$  Follow-up with PCP ?Additional Notes: Since OV cough has increased. Pt is coughing non-stop. Wife gave Pt tessalon pearl last night which had no effect. Pt had a fever last night as well. ? ? ?Summary: Cough  ? Patient was seen on 10/07/2021 and states coughing has worsened and patient feeling warm but did not take temperature. NP prescribed on 10/07/2021 loratadine (CLARITIN) 10 MG tablet and amoxicillin-clavulanate (AUGMENTIN) 875-125 MG tablet and it did not help. Patient can not stop coughing.   ?  ? ?Reason for Disposition ? [1] Continuous (nonstop) coughing interferes with work or school AND [2] no improvement using cough treatment per Care Advice ? ?Answer Assessment - Initial Assessment Questions ?1. ONSET: "When did the cough begin?"  ?    A few days ?2. SEVERITY: "How bad is the cough today?"  ?    Cannot stop coughing ?3. SPUTUM: "Describe the color of your sputum" (none, dry cough; clear, white, yellow, green) ?    clear ?4. HEMOPTYSIS: "Are you coughing up any blood?" If so ask: "How much?" (flecks, streaks, tablespoons, etc.) ?    no ?5. DIFFICULTY BREATHING: "Are you having difficulty breathing?" If Yes, ask: "How bad is it?" (e.g., mild, moderate, severe)  ?  - MILD: No SOB at rest, mild SOB with walking, speaks normally in sentences, can lie down, no retractions, pulse < 100.  ?  - MODERATE: SOB at rest, SOB with minimal exertion and prefers to sit, cannot lie down flat, speaks in phrases, mild retractions, audible wheezing, pulse 100-120.  ?  - SEVERE: Very SOB at rest, speaks in single words, struggling to breathe, sitting hunched forward, retractions, pulse  > 120  ?    mild ?6. FEVER: "Do you have a fever?" If Yes, ask: "What is your temperature, how was it measured, and when did it start?" ?    Unsure - had one one last night ?7. CARDIAC HISTORY: "Do you have any history of heart disease?" (e.g., heart attack, congestive heart failure)  ?    no ?8. LUNG HISTORY: "Do you have any history of lung disease?"  (e.g., pulmonary embolus, asthma, emphysema) ?    no ?9. PE RISK FACTORS: "Do you have a history of blood clots?" (or: recent major surgery, recent prolonged travel, bedridden) ?    no ?10. OTHER SYMPTOMS: "Do you have any other symptoms?" (e.g., runny nose, wheezing, chest pain) ?      no ?11. PREGNANCY: "Is there any chance you are pregnant?" "When was your last menstrual period?" ?      na ?12. TRAVEL: "Have you traveled out of the country in the last month?" (e.g., travel history, exposures) ?      na ? ?Protocols used: Cough - Acute Productive-A-AH ? ?

## 2021-10-09 NOTE — Patient Instructions (Addendum)
You can use the inhaler I have sent in every 6 hours as needed for trouble breathing and coughing  ? ?Please take the antibiotic until it is finished to help make sure any infection is resolved, this is your Amoxicillin- clavulanate 875-125 mg  ?Continue taking the Singulair and Claritin to further assist with nasal symptoms ? ?We will let you know of the results of the chest x-ray when they are available  ? ? ? ?

## 2021-10-09 NOTE — Assessment & Plan Note (Signed)
Acute, recurrent condition ?Currently still taking ABX from encounter on 10/07/2021  ?Recommend he continue Augmentin 875-125 mg PO BID x 10 days to help with resolution  ?Discussed continued use of antihistamines to provide allergy and sinus relief  ?Follow up as needed for persistent or worsening symptoms ?

## 2021-10-09 NOTE — Progress Notes (Signed)
?  ? ? ?Acute Office Visit  ? ?I,April Miller,acting as a Education administrator for [employer], PA-C.,have documented all relevant documentation on the behalf of [place], PA-C,as directed by  [employer], PA-C while in the presence of Eris Hannan E Barney Russomanno, PA-C. ? ? ?[name]   DOB: 1951-03-04   71 y.o. Male  MRN: [id number] ?Visit Date: 10/09/2021 ? ?Today's healthcare provider: Dani Gobble Safwan Tomei, PA-C  ?Introduced myself to the patient as a Journalist, newspaper and provided education on APPs in clinical practice.  ? ? ?Chief Complaint  ?Patient presents with  ? Cough  ? ?Subjective  ?  ?HPI   ?Patient is here for worsening cough. Patient was seen by Tally Joe on 10/07/2021. ? ?States he came in [place] and was given medications that he has been taking as directed ?Since then he has had intractable, nonproductive coughing  ?Reports this has been going on since Jan with waxing and waning symptoms and he is unsure of what is going on ?Last night had a fever- unsure of numeric value but admits to diaphoresis  ?States he has been taking his medications as directed ? ?States last night his coughing was so bad he was unable to sleep ?He was taking Tussin D and Mucinex but these did not provide much relief ?Reports he tried taking Tessalon Pearls from his Jan apt ? ? ? ? ?Medications: ?Outpatient Medications Prior to Visit  ?Medication Sig  ? acetaminophen (TYLENOL) 500 MG tablet Take 1,000 mg by mouth.  ? amoxicillin-clavulanate (AUGMENTIN) 875-125 MG tablet Take 1 tablet by mouth 2 (two) times daily.  ? aspirin 81 MG tablet Take 81 mg by mouth daily.  ? atorvastatin (LIPITOR) 40 MG tablet Take 1 tablet by mouth daily.  ? FAMOTIDINE PO Take by mouth.  ? fluticasone (FLONASE) 50 MCG/ACT nasal spray SPRAY 2 SPRAYS INTO EACH NOSTRIL EVERY DAY  ? Glucosamine-Chondroitin (OSTEO BI-FLEX REGULAR STRENGTH PO) Take by mouth 2 (two) times daily.  ? loratadine (CLARITIN) 10 MG tablet Take 1 tablet (10 mg total) by mouth daily.  ? Misc Natural Products  (OSTEO BI-FLEX JOINT SHIELD PO) Take 2 tablets by mouth daily.  ? montelukast (SINGULAIR) 10 MG tablet TAKE 1 TABLET BY MOUTH EVERYDAY AT BEDTIME  ? Pseudoephedrine-guaiFENesin (MUCINEX D PO) Take by mouth.  ? ?No facility-administered medications prior to visit.  ? ? ?Review of Systems  ?Constitutional:  Positive for diaphoresis and fever. Negative for appetite change.  ?HENT:  Positive for rhinorrhea, sinus pressure and sinus pain. Negative for congestion, ear pain and sore throat.   ?Respiratory:  Positive for cough and shortness of breath.   ?Cardiovascular:  Negative for chest pain and palpitations.  ?Gastrointestinal:  Negative for abdominal pain, nausea and vomiting.  ?Musculoskeletal:  Negative for arthralgias and myalgias.  ?Neurological:  Negative for dizziness, light-headedness and headaches.  ? ? ?  Objective  ?  ?BP (!) 146/90 (BP Location: Right Arm, Patient Position: Sitting, Cuff Size: Large)   Pulse 79   Temp 98.4 ?F (36.9 ?C) (Temporal)   Resp 16   Wt 209 lb (94.8 kg)   SpO2 97%   BMI 28.35 kg/m?  ? ? ?Physical Exam ?Vitals reviewed.  ?Constitutional:   ?   General: He is awake.  ?   Appearance: Normal appearance. He is well-developed, well-groomed and overweight.  ?HENT:  ?   Head: Normocephalic and atraumatic.  ?   Nose: Congestion and rhinorrhea present. Rhinorrhea is purulent.  ?  Mouth/Throat:  ?   Pharynx: Uvula midline. No pharyngeal swelling, oropharyngeal exudate, posterior oropharyngeal erythema or uvula swelling.  ?   Tonsils: No tonsillar exudate or tonsillar abscesses.  ?Cardiovascular:  ?   Rate and Rhythm: Normal rate and regular rhythm.  ?   Pulses: Normal pulses.  ?   Heart sounds: Normal heart sounds.  ?Pulmonary:  ?   Effort: Pulmonary effort is normal.  ?   Breath sounds: Decreased air movement present. Examination of the right-lower field reveals decreased breath sounds. Examination of the left-lower field reveals decreased breath sounds. Decreased breath sounds present.  No wheezing, rhonchi or rales.  ?Musculoskeletal:  ?   Right lower leg: No edema.  ?   Left lower leg: No edema.  ?Neurological:  ?   General: No focal deficit present.  ?   Mental Status: He is alert.  ?   GCS: GCS eye subscore is 4. GCS verbal subscore is 5. GCS motor subscore is 6.  ?Psychiatric:     ?   Attention and Perception: Attention normal.     ?   Mood and Affect: Mood normal.     ?   Speech: Speech normal.     ?   Behavior: Behavior normal. Behavior is cooperative.  ?  ? ? ?No results found for any visits on 10/09/21. ? Assessment & Plan  ?  ? ? ?Problem List Items Addressed This Visit   ? ?  ? Respiratory  ? Acute recurrent maxillary sinusitis  ?  Acute, recurrent condition ?Currently still taking ABX from encounter on 10/07/2021  ?Recommend he continue Augmentin 875-125 mg PO BID x 10 days to help with resolution  ?Discussed continued use of antihistamines to provide allergy and sinus relief  ?Follow up as needed for persistent or worsening symptoms ?  ?  ? ?Other Visit Diagnoses   ? ? Acute cough    -  Primary ?Acute, new concern ?Likely secondary to ongoing sinusitis but could also be reactive airway or asthma exacerbation  ?Patient has hx of allergic rhinitis and is taking Montelukast and 10 mg PO QD along with Claritin 10 mg PO QD  ?Suspect that cough could be an exacerbation of underlying mild, intermittent asthma at this time given it's lack of response to OTC meds and Tessalon as well as timing with current sinusitis ?Will provide Albuterol rescue inhaler along with spacers to assist with medication administration with goal of reducing cough and chest tightness.  ?CXR was reassuring -no signs of pneumonia or cardiopulmonary disease other than potential atelectasis - recommend diaphragmatic breathing exercises  ?Follow up as needed for lingering or worsening symptoms ? ?  ? Relevant Medications  ? albuterol (VENTOLIN HFA) 108 (90 Base) MCG/ACT inhaler  ? Spacer/Aero-Holding Josiah Lobo DEVI  ? Other  Relevant Orders  ? DG Chest 2 View (Completed)  ? Sensation of chest tightness     ?Acute, new concern ?Associated with coughing and current sinusitis  ?CXR was reassuring today - no signs of pneumonia or cardiopulmonary disease other than potential atelectasis - recommend diaphragmatic breathing exercises  ?Recommend using Albuterol inhaler to assist with coughing and chest tightness as I suspect this related to underlying mild asthma exacerbation ?Follow up recommended as needed for persistent or worsening symptoms  ?  ? Relevant Medications  ? albuterol (VENTOLIN HFA) 108 (90 Base) MCG/ACT inhaler  ? ?  ? ? ? ?No follow-ups on file. ? ? ?I, Chrissi Crow E Noeh Sparacino, PA-C, have reviewed all documentation  for this visit. The documentation on 10/09/21 for the exam, diagnosis, procedures, and orders are all accurate and complete. ? ? ?Macklin Jacquin, [place]ie [place] MPH ?[facility] ? ? ? ?

## 2021-10-09 NOTE — Telephone Encounter (Signed)
Noted  

## 2021-10-31 ENCOUNTER — Other Ambulatory Visit: Payer: Self-pay | Admitting: Physician Assistant

## 2021-10-31 DIAGNOSIS — R051 Acute cough: Secondary | ICD-10-CM

## 2021-10-31 DIAGNOSIS — R0789 Other chest pain: Secondary | ICD-10-CM

## 2021-11-02 NOTE — Telephone Encounter (Signed)
Requested Prescriptions  ?Pending Prescriptions Disp Refills  ?? albuterol (VENTOLIN HFA) 108 (90 Base) MCG/ACT inhaler [Pharmacy Med Name: ALBUTEROL HFA (VENTOLIN) INH] 18 each   ?  Sig: TAKE 2 PUFFS BY MOUTH EVERY 6 HOURS AS NEEDED FOR WHEEZE OR SHORTNESS OF BREATH  ?  ? Pulmonology:  [facility] Agonists 2 Passed - 10/31/2021  9:22 AM  ?  ?  Passed - Last BP in normal range  ?  BP Readings from Last 1 Encounters:  ?10/09/21 133/82  ?   ?  ?  Passed - Last Heart Rate in normal range  ?  Pulse Readings from Last 1 Encounters:  ?10/09/21 80  ?   ?  ?  Passed - Valid encounter within last 12 months  ?  Recent Outpatient Visits   ?      ? 3 weeks ago Acute cough  ? [place], PA-C  ? 3 weeks ago Acute recurrent maxillary sinusitis  ? [facility] Tally Joe T, FNP  ? 1 month ago Upper respiratory tract infection, unspecified type  ? [facility] [place], [place], PA-C  ? 9 months ago Encounter for annual wellness visit (AWV) in [place] patient  ? [facility] [place], Dionne Bucy, MD  ? 1 year ago Acute non-recurrent pansinusitis  ? [facility] [place], Anderson Malta M, [place]  ?  ?  ? ?  ?  ?  ? ?

## 2022-02-01 ENCOUNTER — Telehealth: Payer: Self-pay

## 2022-02-01 NOTE — Telephone Encounter (Signed)
Pt informed and agreeable.  

## 2022-02-01 NOTE — Telephone Encounter (Signed)
Patient called to asked if he should continue taking Tylenol '2000mg'$  daily every day. Reports been taking it like this for 4 yrs and he was told by someone that is too much tylenol for him and that it can damage his liver. Reports that he hurts all the time and doesn't think he can stop taking it but wants to hear from the provider. Patient is scheduled to see Dr. Ky Barban on Wednesday and asked if his liver was going to be checked.I told patient yes, just to ask the provider. He still wants me to asked if taking this amount of Tylenol hurts him? And would like a phone call today. Please advise.

## 2022-02-09 ENCOUNTER — Ambulatory Visit: Payer: Self-pay | Admitting: Family Medicine

## 2022-02-09 ENCOUNTER — Encounter: Payer: Medicare HMO | Admitting: Family Medicine

## 2022-02-10 ENCOUNTER — Ambulatory Visit (INDEPENDENT_AMBULATORY_CARE_PROVIDER_SITE_OTHER): Payer: Medicare HMO | Admitting: Family Medicine

## 2022-02-10 ENCOUNTER — Encounter: Payer: Self-pay | Admitting: Family Medicine

## 2022-02-10 VITALS — BP 140/87 | HR 61 | Temp 97.5°F | Resp 16 | Ht 72.0 in | Wt 210.6 lb

## 2022-02-10 DIAGNOSIS — E663 Overweight: Secondary | ICD-10-CM

## 2022-02-10 DIAGNOSIS — K219 Gastro-esophageal reflux disease without esophagitis: Secondary | ICD-10-CM | POA: Diagnosis not present

## 2022-02-10 DIAGNOSIS — Z0000 Encounter for general adult medical examination without abnormal findings: Secondary | ICD-10-CM

## 2022-02-10 DIAGNOSIS — I2510 Atherosclerotic heart disease of native coronary artery without angina pectoris: Secondary | ICD-10-CM

## 2022-02-10 DIAGNOSIS — M479 Spondylosis, unspecified: Secondary | ICD-10-CM

## 2022-02-10 NOTE — Progress Notes (Signed)
Annual Wellness Visit   [name], Male    DOB: [birth year], 71 y.o.   MRN: [id number]  Subjective  Chief Complaint  Patient presents with   Annual Exam    Konner L Choinski is a 71 y.o. male who presents today for his Annual Wellness Visit. He reports consuming a general diet. The patient does not participate in regular exercise at present. He generally feels well. He reports sleeping well. He does not have additional problems to discuss today.   HPI  Elevated BP, atherosclerosis: - Medications: lipitor - Compliance: n/a - Checking BP at home: 120-130s at home.  - Denies any SOB, CP, vision changes, LE edema, medication SEs, or symptoms of hypotension  Arthritis, back pain - tylenol '1000mg'$  BID. Follows with Ortho. Considering knee replacement.  GERD - on famotidine with good effects.    Dental: No current dental problems and Receives regular dental care   Patient Active Problem List   Diagnosis Date Noted   Acute recurrent maxillary sinusitis 10/07/2021   PND (post-nasal drip) 10/07/2021   Polyp of colon    Overweight 01/15/2019   Gastroesophageal reflux disease without esophagitis 06/24/2016   Degenerative arthritis of spine 11/06/2015   Neuralgia neuritis, sciatic nerve 11/06/2015   Lumbar canal stenosis 11/06/2015   SPL (spondylolisthesis) 11/06/2015   Adrenal adenoma 05/26/2015   Atherosclerosis of coronary artery 05/26/2015   Allergic rhinitis 04/09/2015   Bradycardia 08/02/2014   Combined fat and carbohydrate induced hyperlipemia 08/02/2014      Medications: Outpatient Medications Prior to Visit  Medication Sig   acetaminophen (TYLENOL) 500 MG tablet Take 500 mg by mouth 2 (two) times daily.   aspirin 81 MG tablet Take 81 mg by mouth daily.   atorvastatin (LIPITOR) 40 MG tablet Take 1 tablet by mouth daily.   FAMOTIDINE PO Take 20 mg by mouth 2 (two) times daily.   fluticasone (FLONASE) 50 MCG/ACT nasal spray SPRAY 2 SPRAYS INTO EACH  NOSTRIL EVERY DAY   Glucosamine-Chondroitin (OSTEO BI-FLEX REGULAR STRENGTH PO) Take by mouth 2 (two) times daily.   loratadine (CLARITIN) 10 MG tablet Take 1 tablet (10 mg total) by mouth daily.   montelukast (SINGULAIR) 10 MG tablet TAKE 1 TABLET BY MOUTH EVERYDAY AT BEDTIME   albuterol (VENTOLIN HFA) 108 (90 Base) MCG/ACT inhaler TAKE 2 PUFFS BY MOUTH EVERY 6 HOURS AS NEEDED FOR WHEEZE OR SHORTNESS OF BREATH   Spacer/Aero-Holding Chambers DEVI 1 Device by Does not apply route as needed.   [DISCONTINUED] amoxicillin-clavulanate (AUGMENTIN) 875-125 MG tablet Take 1 tablet by mouth 2 (two) times daily.   [DISCONTINUED] Misc Natural Products (OSTEO BI-FLEX JOINT SHIELD PO) Take 2 tablets by mouth daily.   [DISCONTINUED] Pseudoephedrine-guaiFENesin (MUCINEX D PO) Take by mouth.   No facility-administered medications prior to visit.    No Known Allergies  Patient Care Team: Virginia Crews, MD as PCP - General (Family Medicine) Bary Castilla Forest Gleason, MD as Consulting Physician (General Surgery) Hooten, Laurice Record, MD (Orthopedic Surgery)  ROS      Objective  BP (!) 140/87 (BP Location: Left Arm, Patient Position: Sitting, Cuff Size: Large)   Pulse 61   Temp (!) 97.5 F (36.4 C) (Oral)   Resp 16   Ht 6' (1.829 m)   Wt 210 lb 9.6 oz (95.5 kg)   BMI 28.56 kg/m    Physical Exam Vitals reviewed.  Constitutional:      Appearance: Normal appearance.  HENT:     Head: Normocephalic and atraumatic.  Right Ear: Tympanic membrane, ear canal and external ear normal.     Left Ear: Tympanic membrane, ear canal and external ear normal.     Nose: Nose normal.     Mouth/Throat:     Mouth: Mucous membranes are moist.     Pharynx: Oropharynx is clear.  Eyes:     Extraocular Movements: Extraocular movements intact.     Pupils: Pupils are equal, round, and reactive to light.  Cardiovascular:     Rate and Rhythm: Normal rate and regular rhythm.     Heart sounds: Normal heart sounds. No  murmur heard. Pulmonary:     Effort: Pulmonary effort is normal.     Breath sounds: Normal breath sounds.  Abdominal:     General: Bowel sounds are normal.     Palpations: Abdomen is soft.     Tenderness: There is no abdominal tenderness.  Musculoskeletal:        General: Normal range of motion.     Right lower leg: No edema.     Left lower leg: No edema.  Lymphadenopathy:     Cervical: No cervical adenopathy.  Skin:    General: Skin is warm and dry.  Neurological:     Mental Status: He is alert and oriented to person, place, and time. Mental status is at baseline.  Psychiatric:        Mood and Affect: Mood normal.        Behavior: Behavior normal.     Most recent functional status assessment:    02/10/2022   11:18 AM  In your present state of health, do you have any difficulty performing the following activities:  Hearing? 0  Vision? 0  Difficulty concentrating or making decisions? 0  Walking or climbing stairs? 1  Dressing or bathing? 0  Doing errands, shopping? 0   Most recent fall risk assessment:    02/10/2022   11:17 AM  Fall Risk   Falls in the past year? 0  Number falls in past yr: 0  Injury with Fall? 0    Most recent depression screenings:    02/10/2022   11:16 AM 02/02/2021    1:30 PM  PHQ 2/9 Scores  PHQ - 2 Score 0 0  PHQ- 9 Score  0   Most recent cognitive screening:    02/10/2022   11:18 AM  6CIT Screen  What Year? 0 points  What month? 0 points  What time? 0 points  Count back from 20 0 points  Months in reverse 0 points  Repeat phrase 0 points  Total Score 0 points   Most recent Audit-C alcohol use screening    02/10/2022   11:49 AM  Alcohol Use Disorder Test (AUDIT)  1. How often do you have a drink containing alcohol? 3  2. How many drinks containing alcohol do you have on a typical day when you are drinking? 0  3. How often do you have six or more drinks on one occasion? 0  AUDIT-C Score 3  4. How often during the last year have you  found that you were not able to stop drinking once you had started? 0  5. How often during the last year have you failed to do what was normally expected from you because of drinking? 0  6. How often during the last year have you needed a first drink in the morning to get yourself going after a heavy drinking session? 0  7. How often during the last year  have you had a feeling of guilt of remorse after drinking? 0  8. How often during the last year have you been unable to remember what happened the night before because you had been drinking? 0  9. Have you or someone else been injured as a result of your drinking? 0  10. Has a relative or friend or a doctor or another health worker been concerned about your drinking or suggested you cut down? 0  Alcohol Use Disorder Identification Test Final Score (AUDIT) 3   A score of 3 or more in women, and 4 or more in men indicates increased risk for alcohol abuse, EXCEPT if all of the points are from question 1   Vision/Hearing Screen: No results found.    No results found for any visits on 02/10/22.    Assessment & Plan   Annual wellness visit done today including the all of the following: Reviewed patient's Family Medical History Reviewed and updated list of patient's medical providers Assessment of cognitive impairment was done Assessed patient's functional ability Established a written schedule for health screening [place] Completed and Reviewed  Advanced Care Planning: A voluntary discussion about advance care planning including the explanation and discussion of advance directives.  Discussed health care proxy and Living will, and the patient was able to identify a health care proxy as wife, Jamond Neels.  Patient does not have a living will at present time. If patient does have living will, I have requested they bring this to the clinic to be scanned in to their chart.  Exercise Activities and Dietary  recommendations  Goals   None     Immunization History  Administered Date(s) Administered   Influenza, High Dose Seasonal PF 06/07/2017, 04/26/2018, 04/26/2018   Influenza,inj,Quad PF,6+ Mos 04/05/2015   Influenza-Unspecified 04/05/2015, 04/13/2019   PFIZER Comirnaty(Gray Top)Covid-19 Tri-Sucrose Vaccine 05/14/2020, 02/02/2021   PFIZER(Purple Top)SARS-COV-2 Vaccination 07/25/2019, 08/15/2019, 05/14/2020   Pfizer Covid-19 Vaccine Bivalent Booster 82yr & up 05/12/2021   Pneumococcal Conjugate-13 11/17/2016   Pneumococcal Polysaccharide-23 11/22/2017   Td 05/17/2007   Tdap 05/17/2007, 09/23/2017   Zoster Recombinat (Shingrix) 05/18/2019, 10/03/2019   Zoster, Live 08/21/2012    Health Maintenance  Topic Date Due   COVID-19 Vaccine (7 - Pfizer series) 09/09/2021   INFLUENZA VACCINE  02/02/2022   COLONOSCOPY (Pts 45-470yrInsurance coverage will need to be confirmed)  04/08/2026   TETANUS/TDAP  09/24/2027   Pneumonia Vaccine 6529Years old  Completed   Hepatitis C Screening  Completed   Zoster Vaccines- Shingrix  Completed   HPV VACCINES  Aged Out     Discussed health benefits of physical activity, and encouraged him to engage in regular exercise appropriate for his age and condition.    Problem List Items Addressed This Visit       Cardiovascular and Mediastinum   Atherosclerosis of coronary artery    Tolerant of statin, continue. Recheck lipids.        Digestive   Gastroesophageal reflux disease without esophagitis    Doing well on current regimen, no changes made today.        Musculoskeletal and Integument   Degenerative arthritis of spine     Other   Overweight    Discussed diet and exercise as part of healthy lifestyle.      Other Visit Diagnoses     Annual physical exam    -  Primary   Relevant Orders   Comprehensive metabolic panel   Lipid panel  CBC with Differential   Hemoglobin A1c   PSA       No follow-ups on file.     Myles Gip, DO

## 2022-02-10 NOTE — Assessment & Plan Note (Signed)
Doing well on current regimen, no changes made today. 

## 2022-02-10 NOTE — Assessment & Plan Note (Signed)
Tolerant of statin, continue. Recheck lipids.

## 2022-02-10 NOTE — Assessment & Plan Note (Signed)
Discussed diet and exercise as part of healthy lifestyle.

## 2022-02-10 NOTE — Patient Instructions (Signed)

## 2022-02-11 ENCOUNTER — Telehealth: Payer: Self-pay | Admitting: *Deleted

## 2022-02-11 LAB — CBC WITH DIFFERENTIAL/PLATELET
Basophils Absolute: 0.1 10*3/uL (ref 0.0–0.2)
Basos: 1 %
EOS (ABSOLUTE): 0.1 10*3/uL (ref 0.0–0.4)
Eos: 3 %
Hematocrit: 41.9 % (ref 37.5–51.0)
Hemoglobin: 14.7 g/dL (ref 13.0–17.7)
Immature Grans (Abs): 0 10*3/uL (ref 0.0–0.1)
Immature Granulocytes: 0 %
Lymphocytes Absolute: 2.4 10*3/uL (ref 0.7–3.1)
Lymphs: 43 %
MCH: 30.8 pg (ref 26.6–33.0)
MCHC: 35.1 g/dL (ref 31.5–35.7)
MCV: 88 fL (ref 79–97)
Monocytes Absolute: 0.5 10*3/uL (ref 0.1–0.9)
Monocytes: 8 %
Neutrophils Absolute: 2.5 10*3/uL (ref 1.4–7.0)
Neutrophils: 45 %
Platelets: 174 10*3/uL (ref 150–450)
RBC: 4.77 x10E6/uL (ref 4.14–5.80)
RDW: 12.3 % (ref 11.6–15.4)
WBC: 5.6 10*3/uL (ref 3.4–10.8)

## 2022-02-11 LAB — LIPID PANEL
Chol/HDL Ratio: 3.2 ratio (ref 0.0–5.0)
Cholesterol, Total: 136 mg/dL (ref 100–199)
HDL: 42 mg/dL (ref >39)
LDL Chol Calc (NIH): 67 mg/dL (ref 0–99)
Triglycerides: 154 mg/dL — ABNORMAL HIGH (ref 0–149)
VLDL Cholesterol Cal: 27 mg/dL (ref 5–40)

## 2022-02-11 LAB — HEMOGLOBIN A1C
Est. average glucose Bld gHb Est-mCnc: 111 mg/dL
Hgb A1c MFr Bld: 5.5 % (ref 4.8–5.6)

## 2022-02-11 LAB — COMPREHENSIVE METABOLIC PANEL
ALT: 25 IU/L (ref 0–44)
AST: 19 IU/L (ref 0–40)
Albumin/Globulin Ratio: 2 (ref 1.2–2.2)
Albumin: 4.5 g/dL (ref 3.8–4.8)
Alkaline Phosphatase: 54 IU/L (ref 44–121)
BUN/Creatinine Ratio: 9 — ABNORMAL LOW (ref 10–24)
BUN: 9 mg/dL (ref 8–27)
Bilirubin Total: 0.6 mg/dL (ref 0.0–1.2)
CO2: 23 mmol/L (ref 20–29)
Calcium: 9.4 mg/dL (ref 8.6–10.2)
Chloride: 102 mmol/L (ref 96–106)
Creatinine, Ser: 0.99 mg/dL (ref 0.76–1.27)
Globulin, Total: 2.3 g/dL (ref 1.5–4.5)
Glucose: 145 mg/dL — ABNORMAL HIGH (ref 70–99)
Potassium: 4.3 mmol/L (ref 3.5–5.2)
Sodium: 140 mmol/L (ref 134–144)
Total Protein: 6.8 g/dL (ref 6.0–8.5)
eGFR: 81 mL/min/{1.73_m2} (ref >59)

## 2022-02-11 LAB — PSA: Prostate Specific Ag, Serum: 0.8 ng/mL (ref 0.0–4.0)

## 2022-02-11 NOTE — Telephone Encounter (Signed)
Patient called for lab results and was notified- see lab result note.  Patient had additional question for provider: Forgot to ask yesterday- large toe of left foot has fungal discoloration 1/3 of the nail (also one of the nails on hand) No disfiguration - but is bothersome to patient- he has tried OTC without success.  What would provider recommend?   (HWY:SHUOH labs are normal he is going to continue with daily Tylenol dosing)

## 2022-02-15 NOTE — Telephone Encounter (Signed)
Scheduled patient for tomorrow 02/16/2022 at 11:20

## 2022-02-16 ENCOUNTER — Ambulatory Visit (INDEPENDENT_AMBULATORY_CARE_PROVIDER_SITE_OTHER): Payer: Medicare HMO | Admitting: Family Medicine

## 2022-02-16 ENCOUNTER — Encounter: Payer: Self-pay | Admitting: Family Medicine

## 2022-02-16 DIAGNOSIS — B351 Tinea unguium: Secondary | ICD-10-CM | POA: Diagnosis not present

## 2022-02-16 MED ORDER — TERBINAFINE HCL 250 MG PO TABS: 250.0000 mg | ORAL_TABLET | Freq: Every day | ORAL | 0 refills | Status: DC

## 2022-02-16 NOTE — Assessment & Plan Note (Addendum)
Oral terbinafine x12 weeks. LFTs reviewed and wnl. F/u prn.

## 2022-02-16 NOTE — Progress Notes (Signed)
   SUBJECTIVE:   CHIEF COMPLAINT / HPI:   NAIL COMPLAINT - discoloration to bilateral first toenails and R 3rd fingernail tip for years - has tried OTC antifungal gel without relief.   OBJECTIVE:   BP 134/78 (BP Location: Left Arm, Patient Position: Sitting, Cuff Size: Large)   Pulse 66   Temp 97.8 F (36.6 C) (Oral)   Resp 16   Wt 208 lb (94.3 kg)   BMI 28.21 kg/m   Gen: well appearing, in NAD Ext: WWP, no edema, erythema. No skin breakdown, callus. Onychomycosis to bilateral 1st toenails and 3rd R fingernail tip.   ASSESSMENT/PLAN:   Onychomycosis Oral terbinafine x12 weeks. LFTs reviewed and wnl. F/u prn.     Myles Gip, DO

## 2022-02-16 NOTE — Patient Instructions (Addendum)
It was great to see you!  Our plans for today:  - Try voltaren gel (diclofenac) for your joint pain.  - Take terbinafine for your nail fungus.   Take care and seek immediate care sooner if you develop any concerns.   Dr. Ky Barban

## 2022-03-09 ENCOUNTER — Other Ambulatory Visit: Payer: Self-pay | Admitting: Family Medicine

## 2022-03-09 DIAGNOSIS — J3089 Other allergic rhinitis: Secondary | ICD-10-CM

## 2022-03-30 DIAGNOSIS — R238 Other skin changes: Secondary | ICD-10-CM | POA: Diagnosis not present

## 2022-03-30 DIAGNOSIS — L538 Other specified erythematous conditions: Secondary | ICD-10-CM | POA: Diagnosis not present

## 2022-03-30 DIAGNOSIS — D2261 Melanocytic nevi of right upper limb, including shoulder: Secondary | ICD-10-CM | POA: Diagnosis not present

## 2022-03-30 DIAGNOSIS — D225 Melanocytic nevi of trunk: Secondary | ICD-10-CM | POA: Diagnosis not present

## 2022-03-30 DIAGNOSIS — B078 Other viral warts: Secondary | ICD-10-CM | POA: Diagnosis not present

## 2022-03-30 DIAGNOSIS — Z85828 Personal history of other malignant neoplasm of skin: Secondary | ICD-10-CM | POA: Diagnosis not present

## 2022-03-30 DIAGNOSIS — D2272 Melanocytic nevi of left lower limb, including hip: Secondary | ICD-10-CM | POA: Diagnosis not present

## 2022-03-30 DIAGNOSIS — L570 Actinic keratosis: Secondary | ICD-10-CM | POA: Diagnosis not present

## 2022-04-05 ENCOUNTER — Encounter: Payer: Self-pay | Admitting: Family Medicine

## 2022-05-17 ENCOUNTER — Ambulatory Visit (INDEPENDENT_AMBULATORY_CARE_PROVIDER_SITE_OTHER): Payer: Medicare HMO | Admitting: Family Medicine

## 2022-05-17 ENCOUNTER — Encounter: Payer: Self-pay | Admitting: Family Medicine

## 2022-05-17 VITALS — BP 130/78 | HR 63 | Resp 17 | Ht 72.0 in | Wt 209.0 lb

## 2022-05-17 DIAGNOSIS — J069 Acute upper respiratory infection, unspecified: Secondary | ICD-10-CM | POA: Diagnosis not present

## 2022-05-17 DIAGNOSIS — R051 Acute cough: Secondary | ICD-10-CM | POA: Diagnosis not present

## 2022-05-17 DIAGNOSIS — R42 Dizziness and giddiness: Secondary | ICD-10-CM | POA: Diagnosis not present

## 2022-05-17 MED ORDER — MECLIZINE HCL 25 MG PO TABS: 25.0000 mg | ORAL_TABLET | Freq: Three times a day (TID) | ORAL | 0 refills | Status: DC | PRN

## 2022-05-17 MED ORDER — ALBUTEROL SULFATE HFA 108 (90 BASE) MCG/ACT IN AERS: 2.0000 | INHALATION_SPRAY | Freq: Four times a day (QID) | RESPIRATORY_TRACT | 0 refills | Status: DC | PRN

## 2022-05-17 NOTE — Progress Notes (Signed)
I,Tiffany J Bragg,acting as a scribe for Lavon Paganini, MD.,have documented all relevant documentation on the behalf of Lavon Paganini, MD,as directed by  Lavon Paganini, MD while in the presence of Lavon Paganini, MD.  Established patient visit   [name]   DOB: December 20, 1950   71 y.o. Male  MRN: [id number] Visit Date: 05/17/2022  Today's healthcare provider: Lavon Paganini, MD   Chief Complaint  Patient presents with   Nasal Congestion    Patient complains of sinus congestion, drainage, cough for 4 days.    Subjective    HPI HPI     Nasal Congestion    Additional comments: Patient complains of sinus congestion, drainage, cough for 4 days.       Last edited by Smitty Knudsen, CMA on 05/17/2022  1:55 PM.      No fever. Feels a little dizzy. Rhinorrhea. No SOB.  Worse at night. Tried cough drops, mucinex D. Tried inhaler that he had leftover.  Medications: Outpatient Medications Prior to Visit  Medication Sig   acetaminophen (TYLENOL) 500 MG tablet Take 500 mg by mouth 2 (two) times daily.   aspirin 81 MG tablet Take 81 mg by mouth daily.   atorvastatin (LIPITOR) 40 MG tablet Take 1 tablet by mouth daily.   FAMOTIDINE PO Take 20 mg by mouth 2 (two) times daily.   fluticasone (FLONASE) 50 MCG/ACT nasal spray SPRAY 2 SPRAYS INTO EACH NOSTRIL EVERY DAY   Glucosamine-Chondroitin (OSTEO BI-FLEX REGULAR STRENGTH PO) Take by mouth 2 (two) times daily.   loratadine (CLARITIN) 10 MG tablet Take 1 tablet (10 mg total) by mouth daily.   montelukast (SINGULAIR) 10 MG tablet TAKE 1 TABLET BY MOUTH EVERYDAY AT BEDTIME   Spacer/Aero-Holding Chambers DEVI 1 Device by Does not apply route as needed.   [DISCONTINUED] albuterol (VENTOLIN HFA) 108 (90 Base) MCG/ACT inhaler TAKE 2 PUFFS BY MOUTH EVERY 6 HOURS AS NEEDED FOR WHEEZE OR SHORTNESS OF BREATH   [DISCONTINUED] terbinafine (LAMISIL) 250 MG tablet Take 1 tablet (250 mg total) by mouth daily.   No  facility-administered medications prior to visit.    Review of Systems per HPI     Objective    BP 130/78 Comment: home reading  Pulse 63   Resp 17   Ht 6' (1.829 m)   Wt 209 lb (94.8 kg)   SpO2 98%   BMI 28.35 kg/m    Physical Exam Vitals reviewed.  Constitutional:      General: He is not in acute distress.    Appearance: Normal appearance. He is not diaphoretic.  HENT:     Head: Normocephalic and atraumatic.     Nose: Nose normal.     Mouth/Throat:     Mouth: Mucous membranes are moist.     Pharynx: Oropharynx is clear. No oropharyngeal exudate.  Eyes:     General: No scleral icterus.    Conjunctiva/sclera: Conjunctivae normal.  Cardiovascular:     Rate and Rhythm: Normal rate and regular rhythm.     Heart sounds: Normal heart sounds. No murmur heard. Pulmonary:     Effort: Pulmonary effort is normal. No respiratory distress.     Breath sounds: Normal breath sounds. No wheezing or rhonchi.  Musculoskeletal:     Cervical back: Neck supple.  Lymphadenopathy:     Cervical: No cervical adenopathy.  Neurological:     Mental Status: He is alert. Mental status is at baseline.       No  results found for any visits on 05/17/22.  Assessment & Plan     1. Viral URI with cough - symptoms and exam c/w viral URI - no evidence of strep pharyngitis, CAP, AOM, bacterial sinusitis, or other bacterial infection - discussed symptomatic management, natural course, and return precautions   2. Vertigo - likely from viral URI - meclizine prn - return precautions discussed  3. Acute cough - albuterol (VENTOLIN HFA) 108 (90 Base) MCG/ACT inhaler; Inhale 2 puffs into the lungs every 6 (six) hours as needed for wheezing or shortness of breath.  Dispense: 18 each; Refill: 0  Meds ordered this encounter  Medications   albuterol (VENTOLIN HFA) 108 (90 Base) MCG/ACT inhaler    Sig: Inhale 2 puffs into the lungs every 6 (six) hours as needed for wheezing or shortness of breath.     Dispense:  18 each    Refill:  0   meclizine (ANTIVERT) 25 MG tablet    Sig: Take 1 tablet (25 mg total) by mouth 3 (three) times daily as needed for dizziness.    Dispense:  30 tablet    Refill:  0     Return if symptoms worsen or fail to improve.      I, Lavon Paganini, MD, have reviewed all documentation for this visit. The documentation on 05/17/22 for the exam, diagnosis, procedures, and orders are all accurate and complete.   Channin Agustin, Dionne Bucy, MD, MPH [facility]

## 2022-05-26 ENCOUNTER — Ambulatory Visit: Payer: Self-pay | Admitting: *Deleted

## 2022-05-26 ENCOUNTER — Other Ambulatory Visit: Payer: Self-pay | Admitting: Family Medicine

## 2022-05-26 MED ORDER — HYDROCOD POLI-CHLORPHE POLI ER 10-8 MG/5ML PO SUER: 5.0000 mL | Freq: Two times a day (BID) | ORAL | 0 refills | Status: DC | PRN

## 2022-05-26 NOTE — Telephone Encounter (Signed)
Spoke with patient.

## 2022-05-26 NOTE — Telephone Encounter (Signed)
Summary: cough and congestion   Pt was recently seen but he is still experiencing cough and congestion with mucus especially at night / he didn't get any medications prescribed during his last visit but would like to know what he can take or if he can get something for his cough / please advise           Chief Complaint: requesting cough medication due to cough continues x 3 weeks last OV 05/17/22 for same issues. Symptoms: cough better but will not go away . Productive light green mucus at times. Continues to take mucinex D and other prescribed medications inhaler without getting rid of cough. Worse at night but can sleep  Frequency: continues since last OV 05/17/22 Pertinent Negatives: Patient denies chest pain no difficulty breathing no fever.  Disposition: '[]'$ ED /'[]'$ Urgent Care (no appt availability in office) / '[]'$ Appointment(In office/virtual)/ '[]'$  [facility]/ '[]'$ Home Care/ '[]'$ Refused Recommended Disposition /'[]'$ [facility]/ '[x]'$  Follow-up with PCP Additional Notes:   Last OV 05/17/22 for same issues. Requesting cough medication to completely get rid of cough. On going x 3 weeks now . Please advise if another appt needed.      Reason for Disposition  Cough has been present for > 3 weeks  Answer Assessment - Initial Assessment Questions 1. ONSET: "When did the cough begin?"      Last seen in OV 05/17/22 and coughing started 1 week prior  2. SEVERITY: "How bad is the cough today?"      Coughing spells. No worse than before just not able to get rid of cough  3. SPUTUM: "Describe the color of your sputum" (none, dry cough; clear, white, yellow, green)     Light green  4. HEMOPTYSIS: "Are you coughing up any blood?" If so ask: "How much?" (flecks, streaks, tablespoons, etc.)     na 5. DIFFICULTY BREATHING: "Are you having difficulty breathing?" If Yes, ask: "How bad is it?" (e.g., mild, moderate, severe)    - MILD: No SOB at rest, mild SOB with walking, speaks  normally in sentences, can lie down, no retractions, pulse < 100.    - MODERATE: SOB at rest, SOB with minimal exertion and prefers to sit, cannot lie down flat, speaks in phrases, mild retractions, audible wheezing, pulse 100-120.    - SEVERE: Very SOB at rest, speaks in single words, struggling to breathe, sitting hunched forward, retractions, pulse > 120      Denies . SOB 6. FEVER: "Do you have a fever?" If Yes, ask: "What is your temperature, how was it measured, and when did it start?"     na 7. CARDIAC HISTORY: "Do you have any history of heart disease?" (e.g., heart attack, congestive heart failure)      na 8. LUNG HISTORY: "Do you have any history of lung disease?"  (e.g., pulmonary embolus, asthma, emphysema)     na 9. PE RISK FACTORS: "Do you have a history of blood clots?" (or: recent major surgery, recent prolonged travel, bedridden)     na 10. OTHER SYMPTOMS: "Do you have any other symptoms?" (e.g., runny nose, wheezing, chest pain)       Cough worse at night but can sleep  11. PREGNANCY: "Is there any chance you are pregnant?" "When was your last menstrual period?"       na 12. TRAVEL: "Have you traveled out of the country in the last month?" (e.g., travel history, exposures)       na  Protocols  used: Cough - Acute Productive-A-AH

## 2022-06-10 DIAGNOSIS — E782 Mixed hyperlipidemia: Secondary | ICD-10-CM | POA: Diagnosis not present

## 2022-06-10 DIAGNOSIS — I2510 Atherosclerotic heart disease of native coronary artery without angina pectoris: Secondary | ICD-10-CM | POA: Diagnosis not present

## 2022-06-30 ENCOUNTER — Ambulatory Visit: Payer: Medicare HMO | Admitting: Family Medicine

## 2022-06-30 ENCOUNTER — Encounter: Payer: Self-pay | Admitting: Family Medicine

## 2022-06-30 ENCOUNTER — Ambulatory Visit (INDEPENDENT_AMBULATORY_CARE_PROVIDER_SITE_OTHER): Payer: Medicare HMO | Admitting: Family Medicine

## 2022-06-30 VITALS — BP 155/90 | HR 70 | Temp 97.5°F | Wt 207.9 lb

## 2022-06-30 DIAGNOSIS — I10 Essential (primary) hypertension: Secondary | ICD-10-CM | POA: Insufficient documentation

## 2022-06-30 MED ORDER — LOSARTAN POTASSIUM-HCTZ 50-12.5 MG PO TABS: 1.0000 | ORAL_TABLET | Freq: Every day | ORAL | 0 refills | Status: DC

## 2022-06-30 NOTE — Assessment & Plan Note (Addendum)
New dx; has been borderline for years with elevations previously in office and stable at home Now home readings are elevated following use of OTC and Rx cough/cold medications; pt has since stopped medications and elevations are remaining Goal <140/<90 Associated symptoms include dizziness and headaches New rx start of hyzaar to assist RTC appt scheduled with PCP

## 2022-06-30 NOTE — Progress Notes (Signed)
I,Connie R Striblin,acting as a Education administrator for Gwyneth Sprout, FNP.,have documented all relevant documentation on the behalf of Gwyneth Sprout, FNP,as directed by  Gwyneth Sprout, FNP while in the presence of Gwyneth Sprout, FNP.   Established patient visit   [name]   DOB: 11-17-1950   71 y.o. Male  MRN: [id number] Visit Date: 06/30/2022  Today's healthcare provider: Gwyneth Sprout, FNP  Re Introduced to nurse practitioner role and practice setting.  All questions answered.  Discussed provider/patient relationship and expectations.  Chief Complaint  Patient presents with   Follow-up        Subjective    HPI HPI     Follow-up    Additional comments:        Last edited by Araceli Bouche, CMA on 06/30/2022  1:32 PM.      Hypertension: Patient is here for evaluation of elevated blood pressures.  Age at onset of elevated blood pressure:  71 . Patient denies chest pain, chest pressure/discomfort, and dyspnea.  Cardiovascular risk factors: advanced age (older than 48 for men, 38 for women). Use of agents associated with hypertension: none. Pt complains of recent spike in blood pressures 3 weeks ago. Pt complains of constantly being dizzy, and periodic headaches  Pt states that we was recently taking cough medication, but discontinued  At home blood pressures a reading around 153/89  Medications: Outpatient Medications Prior to Visit  Medication Sig   acetaminophen (TYLENOL) 500 MG tablet Take 500 mg by mouth 2 (two) times daily.   albuterol (VENTOLIN HFA) 108 (90 Base) MCG/ACT inhaler Inhale 2 puffs into the lungs every 6 (six) hours as needed for wheezing or shortness of breath.   aspirin 81 MG tablet Take 81 mg by mouth daily.   atorvastatin (LIPITOR) 40 MG tablet Take 1 tablet by mouth daily.   chlorpheniramine-HYDROcodone (TUSSIONEX) 10-8 MG/5ML Take 5 mLs by mouth every 12 (twelve) hours as needed for cough.   FAMOTIDINE PO Take 20 mg by mouth 2 (two)  times daily.   fluticasone (FLONASE) 50 MCG/ACT nasal spray SPRAY 2 SPRAYS INTO EACH NOSTRIL EVERY DAY   Glucosamine-Chondroitin (OSTEO BI-FLEX REGULAR STRENGTH PO) Take by mouth 2 (two) times daily.   loratadine (CLARITIN) 10 MG tablet Take 1 tablet (10 mg total) by mouth daily.   meclizine (ANTIVERT) 25 MG tablet Take 1 tablet (25 mg total) by mouth 3 (three) times daily as needed for dizziness.   montelukast (SINGULAIR) 10 MG tablet TAKE 1 TABLET BY MOUTH EVERYDAY AT BEDTIME   Spacer/Aero-Holding Chambers DEVI 1 Device by Does not apply route as needed.   No facility-administered medications prior to visit.   Review of Systems    Objective    BP (!) 155/90 (BP Location: Left Arm, Patient Position: Sitting, Cuff Size: Normal)   Pulse 70   Temp (!) 97.5 F (36.4 C) (Oral)   Wt 207 lb 14.4 oz (94.3 kg)   SpO2 98%   BMI 28.20 kg/m   Physical Exam Vitals and nursing note reviewed.  Constitutional:      Appearance: Normal appearance. He is overweight.  HENT:     Head: Normocephalic and atraumatic.  Eyes:     Pupils: Pupils are equal, round, and reactive to light.  Cardiovascular:     Rate and Rhythm: Normal rate and regular rhythm.     Pulses: Normal pulses.     Heart sounds: Normal heart sounds.  Pulmonary:  Effort: Pulmonary effort is normal.     Breath sounds: Normal breath sounds.  Musculoskeletal:        General: Normal range of motion.     Cervical back: Normal range of motion.  Skin:    General: Skin is warm and dry.     Capillary Refill: Capillary refill takes less than 2 seconds.  Neurological:     General: No focal deficit present.     Mental Status: He is alert and oriented to person, place, and time. Mental status is at baseline.  Psychiatric:        Mood and Affect: Mood normal.        Behavior: Behavior normal.        Thought Content: Thought content normal.        Judgment: Judgment normal.     No results found for any visits on 06/30/22.   Assessment & Plan     Problem List Items Addressed This Visit       Cardiovascular and Mediastinum   Primary hypertension - Primary    New dx; has been borderline for years with elevations previously in office and stable at home Now home readings are elevated following use of OTC and Rx cough/cold medications; pt has since stopped medications and elevations are remaining Goal <140/<90 Associated symptoms include dizziness and headaches New rx start of hyzaar to assist RTC appt scheduled with PCP      Relevant Medications   losartan-hydrochlorothiazide (HYZAAR) 50-12.5 MG tablet   Return in about 3 weeks (around 07/21/2022) for blood pressure check, HTN management.     Vonna Kotyk, FNP, have reviewed all documentation for this visit. The documentation on 06/30/22 for the exam, diagnosis, procedures, and orders are all accurate and complete.  Gwyneth Sprout, [place] [phone] (phone) [phone] (fax)  [place]

## 2022-07-19 ENCOUNTER — Encounter: Payer: Self-pay | Admitting: Family Medicine

## 2022-07-19 ENCOUNTER — Ambulatory Visit (INDEPENDENT_AMBULATORY_CARE_PROVIDER_SITE_OTHER): Payer: Medicare HMO | Admitting: Family Medicine

## 2022-07-19 VITALS — BP 135/80 | HR 60 | Temp 97.8°F | Resp 16 | Wt 206.2 lb

## 2022-07-19 DIAGNOSIS — I10 Essential (primary) hypertension: Secondary | ICD-10-CM

## 2022-07-19 MED ORDER — LOSARTAN POTASSIUM-HCTZ 50-12.5 MG PO TABS: 1.0000 | ORAL_TABLET | Freq: Every day | ORAL | 3 refills | Status: DC

## 2022-07-19 NOTE — Progress Notes (Signed)
I,Sulibeya S Dimas,acting as a Education administrator for Lavon Paganini, MD.,have documented all relevant documentation on the behalf of Lavon Paganini, MD,as directed by  Lavon Paganini, MD while in the presence of Lavon Paganini, MD.     Established patient visit   [name]   DOB: [birth year]   72 y.o. Male  MRN: [id number] Visit Date: 07/19/2022  Today's healthcare provider: Lavon Paganini, MD   Chief Complaint  Patient presents with   Hypertension   Subjective    HPI  Follow up for hypertension  The patient was last seen for this 2 weeks ago. Changes made at last visit include start hyzaar.  He reports excellent compliance with treatment. He feels that condition is Improved. He is not having side effects.   BP Readings from Last 3 Encounters:  07/19/22 135/80  06/30/22 (!) 155/90  05/17/22 130/78   -----------------------------------------------------------------------------------------   Medications: Outpatient Medications Prior to Visit  Medication Sig   acetaminophen (TYLENOL) 500 MG tablet Take 500 mg by mouth 2 (two) times daily.   albuterol (VENTOLIN HFA) 108 (90 Base) MCG/ACT inhaler Inhale 2 puffs into the lungs every 6 (six) hours as needed for wheezing or shortness of breath.   aspirin 81 MG tablet Take 81 mg by mouth daily.   atorvastatin (LIPITOR) 40 MG tablet Take 1 tablet by mouth daily.   chlorpheniramine-HYDROcodone (TUSSIONEX) 10-8 MG/5ML Take 5 mLs by mouth every 12 (twelve) hours as needed for cough.   FAMOTIDINE PO Take 20 mg by mouth 2 (two) times daily.   fluticasone (FLONASE) 50 MCG/ACT nasal spray SPRAY 2 SPRAYS INTO EACH NOSTRIL EVERY DAY   Glucosamine-Chondroitin (OSTEO BI-FLEX REGULAR STRENGTH PO) Take by mouth 2 (two) times daily.   loratadine (CLARITIN) 10 MG tablet Take 1 tablet (10 mg total) by mouth daily.   meclizine (ANTIVERT) 25 MG tablet Take 1 tablet (25 mg total) by mouth 3 (three) times daily as needed for  dizziness.   montelukast (SINGULAIR) 10 MG tablet TAKE 1 TABLET BY MOUTH EVERYDAY AT BEDTIME   Spacer/Aero-Holding Chambers DEVI 1 Device by Does not apply route as needed.   [DISCONTINUED] losartan-hydrochlorothiazide (HYZAAR) 50-12.5 MG tablet Take 1 tablet by mouth daily.   No facility-administered medications prior to visit.    Review of Systems per HPI     Objective    BP 135/80 (BP Location: Left Arm, Patient Position: Sitting, Cuff Size: Large)   Pulse 60   Temp 97.8 F (36.6 C) (Temporal)   Resp 16   Wt 206 lb 3.2 oz (93.5 kg)   BMI 27.97 kg/m  BP Readings from Last 3 Encounters:  07/19/22 135/80  06/30/22 (!) 155/90  05/17/22 130/78   Wt Readings from Last 3 Encounters:  07/19/22 206 lb 3.2 oz (93.5 kg)  06/30/22 207 lb 14.4 oz (94.3 kg)  05/17/22 209 lb (94.8 kg)      Physical Exam Vitals reviewed.  Constitutional:      General: He is not in acute distress.    Appearance: Normal appearance. He is not diaphoretic.  HENT:     Head: Normocephalic and atraumatic.  Eyes:     General: No scleral icterus.    Conjunctiva/sclera: Conjunctivae normal.  Cardiovascular:     Rate and Rhythm: Normal rate and regular rhythm.     Pulses: Normal pulses.     Heart sounds: Normal heart sounds. No murmur heard. Pulmonary:     Effort: Pulmonary effort is normal. No respiratory distress.  Breath sounds: Normal breath sounds. No wheezing or rhonchi.  Musculoskeletal:     Cervical back: Neck supple.     Right lower leg: No edema.     Left lower leg: No edema.  Lymphadenopathy:     Cervical: No cervical adenopathy.  Skin:    General: Skin is warm and dry.     Findings: No rash.  Neurological:     Mental Status: He is alert and oriented to person, place, and time. Mental status is at baseline.  Psychiatric:        Mood and Affect: Mood normal.        Behavior: Behavior normal.       No results found for any visits on 07/19/22.  Assessment & Plan     Problem  List Items Addressed This Visit       Cardiovascular and Mediastinum   Primary hypertension - Primary    New problem at last visit Well controlled Continue current medications Recheck metabolic panel F/u in 6 months       Relevant Medications   losartan-hydrochlorothiazide (HYZAAR) 50-12.5 MG tablet   Other Relevant Orders   Basic Metabolic Panel (BMET)     Return in about 7 months (around 02/17/2023) for CPE.      I, Lavon Paganini, MD, have reviewed all documentation for this visit. The documentation on 07/19/22 for the exam, diagnosis, procedures, and orders are all accurate and complete.   Chioma Mukherjee, Dionne Bucy, MD, MPH [facility]

## 2022-07-19 NOTE — Assessment & Plan Note (Signed)
New problem at last visit Well controlled Continue current medications Recheck metabolic panel F/u in 6 months

## 2022-07-20 LAB — BASIC METABOLIC PANEL
BUN/Creatinine Ratio: 12 (ref 10–24)
BUN: 12 mg/dL (ref 8–27)
CO2: 24 mmol/L (ref 20–29)
Calcium: 10 mg/dL (ref 8.6–10.2)
Chloride: 100 mmol/L (ref 96–106)
Creatinine, Ser: 1.04 mg/dL (ref 0.76–1.27)
Glucose: 72 mg/dL (ref 70–99)
Potassium: 4.1 mmol/L (ref 3.5–5.2)
Sodium: 137 mmol/L (ref 134–144)
eGFR: 77 mL/min/{1.73_m2} (ref >59)

## 2022-08-04 ENCOUNTER — Ambulatory Visit (INDEPENDENT_AMBULATORY_CARE_PROVIDER_SITE_OTHER): Payer: Medicare HMO | Admitting: Physician Assistant

## 2022-08-04 ENCOUNTER — Encounter: Payer: Self-pay | Admitting: Physician Assistant

## 2022-08-04 VITALS — BP 128/86 | HR 63 | Temp 97.6°F | Ht 72.0 in | Wt 208.6 lb

## 2022-08-04 DIAGNOSIS — U071 COVID-19: Secondary | ICD-10-CM | POA: Diagnosis not present

## 2022-08-04 DIAGNOSIS — R0981 Nasal congestion: Secondary | ICD-10-CM | POA: Diagnosis not present

## 2022-08-04 DIAGNOSIS — R051 Acute cough: Secondary | ICD-10-CM

## 2022-08-04 DIAGNOSIS — R5383 Other fatigue: Secondary | ICD-10-CM

## 2022-08-04 LAB — POCT INFLUENZA A/B
Influenza A, POC: NEGATIVE
Influenza B, POC: NEGATIVE

## 2022-08-04 LAB — POC COVID19 BINAXNOW: SARS Coronavirus 2 Ag: POSITIVE — AB

## 2022-08-04 MED ORDER — MOLNUPIRAVIR EUA 200MG CAPSULE: 4.0000 | ORAL_CAPSULE | Freq: Two times a day (BID) | ORAL | 0 refills | Status: DC

## 2022-08-04 NOTE — Progress Notes (Signed)
Established patient visit   [name]   DOB: Feb 22, 1951   72 y.o. Male  MRN: [id number] Visit Date: 08/04/2022  Today's healthcare provider: Mikey Kirschner, PA-C   Cc. Cough, nasal congestion x 4 days  Subjective    HPI  Pt reports fatigue, cough ,congestion, rhinorrhea x 3-4 days. Reports his wife was recently sick. Pt thinks he had a fever on Monday. Denies SOB, wheezing. Using corcidin OTC.  Medications: Outpatient Medications Prior to Visit  Medication Sig   acetaminophen (TYLENOL) 500 MG tablet Take 500 mg by mouth 2 (two) times daily.   albuterol (VENTOLIN HFA) 108 (90 Base) MCG/ACT inhaler Inhale 2 puffs into the lungs every 6 (six) hours as needed for wheezing or shortness of breath.   aspirin 81 MG tablet Take 81 mg by mouth daily.   atorvastatin (LIPITOR) 40 MG tablet Take 1 tablet by mouth daily.   FAMOTIDINE PO Take 20 mg by mouth 2 (two) times daily.   fluticasone (FLONASE) 50 MCG/ACT nasal spray SPRAY 2 SPRAYS INTO EACH NOSTRIL EVERY DAY   Glucosamine-Chondroitin (OSTEO BI-FLEX REGULAR STRENGTH PO) Take by mouth 2 (two) times daily.   loratadine (CLARITIN) 10 MG tablet Take 1 tablet (10 mg total) by mouth daily.   losartan-hydrochlorothiazide (HYZAAR) 50-12.5 MG tablet Take 1 tablet by mouth daily.   montelukast (SINGULAIR) 10 MG tablet TAKE 1 TABLET BY MOUTH EVERYDAY AT BEDTIME   Spacer/Aero-Holding Chambers DEVI 1 Device by Does not apply route as needed.   chlorpheniramine-HYDROcodone (TUSSIONEX) 10-8 MG/5ML Take 5 mLs by mouth every 12 (twelve) hours as needed for cough. (Patient not taking: Reported on 08/04/2022)   meclizine (ANTIVERT) 25 MG tablet Take 1 tablet (25 mg total) by mouth 3 (three) times daily as needed for dizziness. (Patient not taking: Reported on 08/04/2022)   No facility-administered medications prior to visit.    Review of Systems  Constitutional:  Positive for fatigue. Negative for fever.  HENT:  Positive for  congestion, postnasal drip, rhinorrhea and sinus pressure.   Respiratory:  Positive for cough. Negative for shortness of breath.   Cardiovascular:  Negative for chest pain, palpitations and leg swelling.  Neurological:  Negative for dizziness and headaches.      Objective    BP 128/86   Pulse 63   Temp 97.6 F (36.4 C)   Ht 6' (1.829 m)   Wt 208 lb 9.6 oz (94.6 kg)   SpO2 99%   BMI 28.29 kg/m   Physical Exam Constitutional:      General: He is awake.     Appearance: He is well-developed.  HENT:     Head: Normocephalic.  Eyes:     Conjunctiva/sclera: Conjunctivae normal.  Cardiovascular:     Rate and Rhythm: Normal rate and regular rhythm.     Heart sounds: Normal heart sounds.  Pulmonary:     Effort: Pulmonary effort is normal.     Breath sounds: Normal breath sounds. No wheezing, rhonchi or rales.  Skin:    General: Skin is warm.  Neurological:     Mental Status: He is alert and oriented to person, place, and time.  Psychiatric:        Attention and Perception: Attention normal.        Mood and Affect: Mood normal.        Speech: Speech normal.        Behavior: Behavior is cooperative.      Results for orders placed or  performed in visit on 08/04/22  POC COVID-19  Result Value Ref Range   SARS Coronavirus 2 Ag Positive (A) Negative  POCT Influenza A/B  Result Value Ref Range   Influenza A, POC Negative Negative   Influenza B, POC Negative Negative    Assessment & Plan     COVID 19 Poc covid pos, flu a/b negative. Rx molnupirvavir antiviral  Rest, fluids, ok to continue OTC cough medication  Any SOB, significant malaise, dizziness please call office Return if symptoms worsen or fail to improve.      I, Mikey Kirschner, PA-C have reviewed all documentation for this visit. The documentation on  08/04/22  for the exam, diagnosis, procedures, and orders are all accurate and complete.  Mikey Kirschner, PA-C [facility] [address]  [address] [place], [place], [address] Office: [phone] Fax: [place]

## 2022-08-05 ENCOUNTER — Telehealth: Payer: Self-pay | Admitting: Family Medicine

## 2022-08-05 ENCOUNTER — Other Ambulatory Visit: Payer: Self-pay | Admitting: Physician Assistant

## 2022-08-05 MED ORDER — NIRMATRELVIR/RITONAVIR (PAXLOVID)TABLET: 3.0000 | ORAL_TABLET | Freq: Two times a day (BID) | ORAL | 0 refills | Status: AC

## 2022-08-05 NOTE — Telephone Encounter (Signed)
Pt reports that he was prescribed Paxlovid by Mikey Kirschner on yesterday but it costs over $1100. Pt stated when he attempted to use a voucher for Medicare patients he was told that [facility]'s information is showing the following address: [address] [place] [address]. Pt requests call back at [phone]

## 2022-09-01 ENCOUNTER — Other Ambulatory Visit: Payer: Self-pay | Admitting: Family Medicine

## 2022-09-01 DIAGNOSIS — J3089 Other allergic rhinitis: Secondary | ICD-10-CM

## 2022-11-15 ENCOUNTER — Encounter: Payer: Self-pay | Admitting: Family Medicine

## 2022-11-15 ENCOUNTER — Ambulatory Visit (INDEPENDENT_AMBULATORY_CARE_PROVIDER_SITE_OTHER): Payer: Medicare HMO | Admitting: Family Medicine

## 2022-11-15 VITALS — BP 111/75 | HR 57 | Temp 98.3°F | Resp 12 | Wt 205.0 lb

## 2022-11-15 DIAGNOSIS — I10 Essential (primary) hypertension: Secondary | ICD-10-CM | POA: Diagnosis not present

## 2022-11-15 DIAGNOSIS — B070 Plantar wart: Secondary | ICD-10-CM | POA: Diagnosis not present

## 2022-11-15 DIAGNOSIS — G44309 Post-traumatic headache, unspecified, not intractable: Secondary | ICD-10-CM | POA: Diagnosis not present

## 2022-11-15 MED ORDER — LOSARTAN POTASSIUM 50 MG PO TABS: 50.0000 mg | ORAL_TABLET | Freq: Every day | ORAL | 1 refills | Status: DC
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
## 2022-11-15 NOTE — Assessment & Plan Note (Signed)
BP low normal today Will change from Hyzaar to Losartan 50 mg daily - d/c'ng HCTZ compnent Recheck metabolic panel at next visit

## 2022-12-01 ENCOUNTER — Encounter: Payer: Self-pay | Admitting: Podiatry

## 2022-12-01 ENCOUNTER — Ambulatory Visit: Payer: Medicare HMO | Admitting: Podiatry

## 2022-12-01 DIAGNOSIS — M7752 Other enthesopathy of left foot: Secondary | ICD-10-CM

## 2022-12-01 DIAGNOSIS — D2372 Other benign neoplasm of skin of left lower limb, including hip: Secondary | ICD-10-CM | POA: Diagnosis not present

## 2022-12-01 DIAGNOSIS — D2371 Other benign neoplasm of skin of right lower limb, including hip: Secondary | ICD-10-CM

## 2022-12-01 MED ORDER — DEXAMETHASONE SODIUM PHOSPHATE 120 MG/30ML IJ SOLN
2.0000 mg | Freq: Once | INTRAMUSCULAR | Status: AC
  Administered 2022-12-01: 2 mg via INTRA_ARTICULAR

## 2022-12-01 NOTE — Progress Notes (Signed)
Subjective:  Patient ID: Ryan English, male    DOB: December 15, 1950,  MRN: [id number] HPI Chief Complaint  Patient presents with   Plantar Warts    "I got a plantar wart on my right foot.  I have a place along side of my toe on my left foot."    72 y.o. male presents with the above complaint.   ROS: Denies fever chills nausea vomit muscle aches pains calf pain back pain chest pain shortness of breath  Past Medical History:  Diagnosis Date   Arthritis    GERD (gastroesophageal reflux disease)    Hyperlipidemia    Past Surgical History:  Procedure Laterality Date   APPENDECTOMY  1999   COLONOSCOPY WITH PROPOFOL N/A 04/08/2021   Procedure: COLONOSCOPY WITH PROPOFOL;  Surgeon: Toney Reil, MD;  Location: [facility] ENDOSCOPY;  Service: Gastroenterology;  Laterality: N/A;    Current Outpatient Medications:    acetaminophen (TYLENOL) 500 MG tablet, Take 500 mg by mouth 2 (two) times daily., Disp: , Rfl:    albuterol (VENTOLIN HFA) 108 (90 Base) MCG/ACT inhaler, Inhale 2 puffs into the lungs every 6 (six) hours as needed for wheezing or shortness of breath., Disp: 18 each, Rfl: 0   aspirin 81 MG tablet, Take 81 mg by mouth daily., Disp: , Rfl:    atorvastatin (LIPITOR) 40 MG tablet, Take 1 tablet by mouth daily., Disp: , Rfl:    FAMOTIDINE PO, Take 20 mg by mouth 2 (two) times daily., Disp: , Rfl:    fluticasone (FLONASE) 50 MCG/ACT nasal spray, SPRAY 2 SPRAYS INTO EACH NOSTRIL EVERY DAY, Disp: 48 mL, Rfl: 2   Glucosamine-Chondroitin (OSTEO BI-FLEX REGULAR STRENGTH PO), Take by mouth 2 (two) times daily., Disp: , Rfl:    loratadine (CLARITIN) 10 MG tablet, Take 1 tablet (10 mg total) by mouth daily., Disp: 30 tablet, Rfl: 11   losartan (COZAAR) 50 MG tablet, Take 1 tablet (50 mg total) by mouth daily., Disp: 90 tablet, Rfl: 1   montelukast (SINGULAIR) 10 MG tablet, TAKE 1 TABLET BY MOUTH EVERYDAY AT BEDTIME, Disp: 90 tablet, Rfl: 1  No Known Allergies Review of Systems Objective:   There were no vitals filed for this visit.  General: Well developed, nourished, in no acute distress, alert and oriented x3   Dermatological: Skin is warm, dry and supple bilateral. Nails x 10 are well maintained; remaining integument appears unremarkable at this time. There are no open sores, no preulcerative lesions, no rash or signs of infection present.  He has an area on the plantar medial aspect of the right heel which was like a puncture wound to have some bleeding beneath it.  I was able to debride the wound today was not able to find any foreign body.  There is no erythema cellulitis drainage or odor no signs of infection.  Left foot does demonstrate a toenail with mild area of fungus beneath the lateral nail fold does not appear to be insidious I do think this is going to go ahead and heal on its own with the oral therapy that he has waiting for him at home.  Also he has a painful area to the lateral aspect of the fifth metatarsal phalangeal joint area.  This does have a palpable bursa beneath it as well.  Vascular: Dorsalis Pedis artery and Posterior Tibial artery pedal pulses are 2/4 bilateral with immedate capillary fill time. Pedal hair growth present. No varicosities and no lower extremity edema present bilateral.   Neruologic: Grossly  intact via light touch bilateral. Vibratory intact via tuning fork bilateral. Protective threshold with Semmes Wienstein monofilament intact to all pedal sites bilateral. Patellar and Achilles deep tendon reflexes 2+ bilateral. No Babinski or clonus noted bilateral.   Musculoskeletal: No gross boney pedal deformities bilateral. No pain, crepitus, or limitation noted with foot and ankle range of motion bilateral. Muscular strength 5/5 in all groups tested bilateral.  Gait: Unassisted, Nonantalgic.    Radiographs:  None taken  Assessment & Plan:   Assessment: Bursitis painful benign skin lesion fifth met head left.  Possible foreign body plantar  medial right heel  Plan: Debridement of all lesions.  Injected 2 mg of dexamethasone to the point of maximal tenderness fifth metatarsal head left foot.  I also discussed appropriate shoe gear with him.     Victor Granados T. [place], [place]

## 2022-12-10 ENCOUNTER — Encounter: Payer: Self-pay | Admitting: Physician Assistant

## 2022-12-10 ENCOUNTER — Ambulatory Visit (INDEPENDENT_AMBULATORY_CARE_PROVIDER_SITE_OTHER): Payer: Medicare HMO | Admitting: Physician Assistant

## 2022-12-10 VITALS — BP 117/83 | HR 65 | Temp 98.4°F | Ht 72.01 in | Wt 207.6 lb

## 2022-12-10 DIAGNOSIS — K219 Gastro-esophageal reflux disease without esophagitis: Secondary | ICD-10-CM | POA: Diagnosis not present

## 2022-12-10 DIAGNOSIS — R053 Chronic cough: Secondary | ICD-10-CM

## 2022-12-10 MED ORDER — IPRATROPIUM BROMIDE 0.03 % NA SOLN: 2.0000 | Freq: Two times a day (BID) | NASAL | 12 refills | Status: AC

## 2022-12-10 NOTE — Progress Notes (Unsigned)
Established patient visit  [name]   DOB: 1951-01-03   72 y.o. Male  MRN: [id number] Visit Date: 12/10/2022  Today's healthcare provider: Debera Lat, PA-C   Chief Complaint  Patient presents with   Cough    Has had cough for 2 weeks   Subjective    Cough   HPI     Cough    Additional comments: Has had cough for 2 weeks      Last edited by Sherolyn Buba, CMA on 12/10/2022  8:43 AM.      Discussed the use of AI scribe software for clinical note transcription with the patient, who gave verbal consent to proceed.  History of Present Illness       The patient, with a history of acid reflux and allergic rhinitis, presents with a persistent cough and mucus production for the past two to three weeks. The patient has been self-medicating with over-the-counter cough medicine and Mucinex D. The patient's symptoms are particularly bothersome at night. The patient's wife, who has been experiencing similar symptoms, was recently diagnosed with bronchitis. The patient also mentions potential exposure to COVID-19 at work, although the patient has not experienced any fever since the initial onset of symptoms. The patient's current medications include omeprazole for acid reflux and a nasal spray for allergic rhinitis.      11/15/2022    1:15 PM 08/04/2022    2:56 PM 06/30/2022    1:56 PM  Depression screen PHQ 2/9  Decreased Interest 0 0 0  Down, Depressed, Hopeless 0 0 0  PHQ - 2 Score 0 0 0  Altered sleeping 0 0 0  Tired, decreased energy 0 0 0  Change in appetite 0 0 0  Feeling bad or failure about yourself  0 0 0  Trouble concentrating 0 0 0  Moving slowly or fidgety/restless 0 0 0  Suicidal thoughts 0 0 0  PHQ-9 Score 0 0 0  Difficult doing work/chores Not difficult at all Not difficult at all Not difficult at all    Medications: Outpatient Medications Prior to Visit  Medication Sig   acetaminophen (TYLENOL) 500 MG tablet Take 500 mg by mouth 2 (two)  times daily.   albuterol (VENTOLIN HFA) 108 (90 Base) MCG/ACT inhaler Inhale 2 puffs into the lungs every 6 (six) hours as needed for wheezing or shortness of breath.   aspirin 81 MG tablet Take 81 mg by mouth daily.   atorvastatin (LIPITOR) 40 MG tablet Take 1 tablet by mouth daily.   FAMOTIDINE PO Take 20 mg by mouth 2 (two) times daily.   fluticasone (FLONASE) 50 MCG/ACT nasal spray SPRAY 2 SPRAYS INTO EACH NOSTRIL EVERY DAY   Glucosamine-Chondroitin (OSTEO BI-FLEX REGULAR STRENGTH PO) Take by mouth 2 (two) times daily.   loratadine (CLARITIN) 10 MG tablet Take 1 tablet (10 mg total) by mouth daily.   losartan (COZAAR) 50 MG tablet Take 1 tablet (50 mg total) by mouth daily.   montelukast (SINGULAIR) 10 MG tablet TAKE 1 TABLET BY MOUTH EVERYDAY AT BEDTIME   No facility-administered medications prior to visit.    Review of Systems  Respiratory:  Positive for cough.   All other systems reviewed and are negative.  Except see HPI   {Labs  Heme  Chem  Endocrine  Serology  Results Review (optional):23779}   Objective    BP 117/83   Pulse 65   Temp 98.4 F (36.9 C) (Oral)   Ht 6' 0.01" (1.829 m)  Wt 207 lb 9.6 oz (94.2 kg)   SpO2 99%   BMI 28.15 kg/m  {Show previous vital signs (optional):23777}  Physical Exam Vitals reviewed.  Constitutional:      General: He is in acute distress.     Appearance: Normal appearance. He is not ill-appearing, toxic-appearing or diaphoretic.  HENT:     Head: Normocephalic and atraumatic.     Right Ear: Tympanic membrane, ear canal and external ear normal.     Left Ear: Tympanic membrane, ear canal and external ear normal.     Nose: Congestion (very mild) and rhinorrhea present.     Mouth/Throat:     Pharynx: No posterior oropharyngeal erythema.     Comments: Postnasal drainage Eyes:     General: No scleral icterus.       Right eye: No discharge.        Left eye: No discharge.     Extraocular Movements: Extraocular movements intact.      Conjunctiva/sclera: Conjunctivae normal.     Pupils: Pupils are equal, round, and reactive to light.  Cardiovascular:     Rate and Rhythm: Normal rate and regular rhythm.     Pulses: Normal pulses.     Heart sounds: Normal heart sounds. No murmur heard. Pulmonary:     Effort: Pulmonary effort is normal. No respiratory distress.     Breath sounds: Normal breath sounds. No wheezing or rhonchi.  Abdominal:     General: Abdomen is flat. Bowel sounds are normal.     Palpations: Abdomen is soft.  Musculoskeletal:        General: Normal range of motion.     Cervical back: Normal range of motion and neck supple.     Right lower leg: No edema.     Left lower leg: No edema.  Lymphadenopathy:     Cervical: No cervical adenopathy.  Skin:    General: Skin is warm and dry.     Findings: No rash.  Neurological:     General: No focal deficit present.     Mental Status: He is alert and oriented to person, place, and time. Mental status is at baseline.  Psychiatric:        Behavior: Behavior normal.        Thought Content: Thought content normal.      No results found for any visits on 12/10/22.  Assessment & Plan    Post-viral Cough Syndrome: Persistent cough for almost three weeks with mucus production, likely post-viral given the time course and symptoms. No fever currently. Noted post-nasal drainage on examination. -Prescribe ipratropium nasal spray. -Advised using nasal saline rinse/spray. -Continue current antihistamine (loratadine) or consider switching to cetirizine for stronger effect.  Gastroesophageal Reflux Disease (GERD): Currently on famotidine. GERD can contribute to cough. -Temporarily switch to omeprazole 20mg  twice daily for stronger acid suppression. -Continue lifestyle modifications for GERD management (avoid trigger foods, wait 3 hours after eating before reclining, sleep with head elevated).  COVID-19 Exposure: Recent exposure at work, but symptoms started two  weeks ago and no current fever. Testing at this point would not change management. -No COVID-19 testing at this time.  Follow-up: Monitor response to new medications and continue to manage symptoms. If cough persists or worsens, return for further evaluation.  No follow-ups on file.     The patient was advised to call back or seek an in-person evaluation if the symptoms worsen or if the condition fails to improve as anticipated.  I discussed the assessment and  treatment plan with the patient. The patient was provided an opportunity to ask questions and all were answered. The patient agreed with the plan and demonstrated an understanding of the instructions.  I, Debera Lat, PA-C have reviewed all documentation for this visit. The documentation on  12/09/22  for the exam, diagnosis, procedures, and orders are all accurate and complete.  Debera Lat, [facility], MMS [facility] [phone] (phone) [phone] (fax)  [facility]

## 2023-01-19 ENCOUNTER — Ambulatory Visit: Payer: Medicare HMO

## 2023-01-19 VITALS — BP 122/80 | Ht 72.0 in | Wt 204.2 lb

## 2023-01-19 DIAGNOSIS — Z0000 Encounter for general adult medical examination without abnormal findings: Secondary | ICD-10-CM

## 2023-01-19 NOTE — Progress Notes (Addendum)
Subjective:   Ryan English is a 72 y.o. male who presents for Medicare Annual/Subsequent preventive examination.  Visit Complete: In person  Patient Medicare AWV questionnaire was completed by the patient on 01/19/23; I have confirmed that all information answered by patient is correct and no changes since this date.  Review of Systems    Cardiac Risk Factors include: advanced age (>63men, >40 women);dyslipidemia;hypertension;male gender    Objective:    Today's Vitals   01/19/23 0738 01/19/23 1149  BP:  122/80  Weight:  204 lb 3.2 oz (92.6 kg)  Height:  6' (1.829 m)  PainSc: 5     Body mass index is 27.69 kg/m.     01/19/2023   12:02 PM 04/08/2021    8:28 AM 11/06/2015    2:19 PM 08/20/2015    2:48 PM 06/06/2015    4:07 PM 05/26/2015   12:29 PM  Advanced Directives  Does Patient Have a Medical Advance Directive? No No No Yes No Yes  Type of Advance Directive    Living will  Living will  Would patient like information on creating a medical advance directive?  No - Patient declined   No - patient declined information     Current Medications (verified) Outpatient Encounter Medications as of 01/19/2023  Medication Sig   acetaminophen (TYLENOL) 500 MG tablet Take 500 mg by mouth 2 (two) times daily.   albuterol (VENTOLIN HFA) 108 (90 Base) MCG/ACT inhaler Inhale 2 puffs into the lungs every 6 (six) hours as needed for wheezing or shortness of breath.   aspirin 81 MG tablet Take 81 mg by mouth daily.   atorvastatin (LIPITOR) 40 MG tablet Take 1 tablet by mouth daily.   FAMOTIDINE PO Take 20 mg by mouth 2 (two) times daily.   fluticasone (FLONASE) 50 MCG/ACT nasal spray SPRAY 2 SPRAYS INTO EACH NOSTRIL EVERY DAY   Glucosamine-Chondroitin (OSTEO BI-FLEX REGULAR STRENGTH PO) Take by mouth 2 (two) times daily.   ipratropium (ATROVENT) 0.03 % nasal spray Place 2 sprays into both nostrils every 12 (twelve) hours.   loratadine (CLARITIN) 10 MG tablet Take 1 tablet (10 mg total) by  mouth daily.   losartan (COZAAR) 50 MG tablet Take 1 tablet (50 mg total) by mouth daily.   montelukast (SINGULAIR) 10 MG tablet TAKE 1 TABLET BY MOUTH EVERYDAY AT BEDTIME   No facility-administered encounter medications on file as of 01/19/2023.    Allergies (verified) Patient has no known allergies.   History: Past Medical History:  Diagnosis Date   Arthritis    GERD (gastroesophageal reflux disease)    Hyperlipidemia    Past Surgical History:  Procedure Laterality Date   APPENDECTOMY  1999   COLONOSCOPY WITH PROPOFOL N/A 04/08/2021   Procedure: COLONOSCOPY WITH PROPOFOL;  Surgeon: Toney Reil, MD;  Location: [facility];  Service: Gastroenterology;  Laterality: N/A;   Family History  Problem Relation Age of Onset   Cancer Mother    Diabetes Sister    Heart disease Brother        MI   Diabetes Sister    Heart disease Brother    Diabetes Brother    Diabetes Brother    Social History   Socioeconomic History   Marital status: Married    Spouse name: Not on file   Number of children: 2   Years of education: Not on file   Highest education level: Not on file  Occupational History   Not on file  Tobacco Use  Smoking status: Never   Smokeless tobacco: Never  Vaping Use   Vaping status: Never Used  Substance and Sexual Activity   Alcohol use: Yes    Alcohol/week: 6.0 standard drinks of alcohol    Types: 6 Cans of beer per week    Comment: occasionally   Drug use: No   Sexual activity: Not on file  Other Topics Concern   Not on file  Social History Narrative   ** Merged History Encounter **       Social Determinants of Health   Financial Resource Strain: Low Risk  (01/19/2023)   Overall Financial Resource Strain (CARDIA)    Difficulty of Paying Living Expenses: Not hard at all  Food Insecurity: No Food Insecurity (01/19/2023)   Hunger Vital Sign    Worried About Running Out of Food in the Last Year: Never true    Ran Out of Food in the Last Year:  Never true  Transportation Needs: No Transportation Needs (01/19/2023)   PRAPARE - Administrator, Civil Service (Medical): No    Lack of Transportation (Non-Medical): No  Physical Activity: Sufficiently Active (01/19/2023)   Exercise Vital Sign    Days of Exercise per Week: 3 days    Minutes of Exercise per Session: 60 min  Stress: No Stress Concern Present (01/19/2023)   [employer] of Occupational Health - Occupational Stress Questionnaire    Feeling of Stress : Not at all  Social Connections: Unknown (01/19/2023)   Social Connection and Isolation Panel [NHANES]    Frequency of Communication with Friends and Family: Not on file    Frequency of Social Gatherings with Friends and Family: Twice a week    Attends Religious Services: Not on Marketing executive or Organizations: No    Attends Banker Meetings: Never    Marital Status: Married    Tobacco Counseling Counseling given: Not Answered   Clinical Intake:  Pre-visit preparation completed: Yes  Pain : 0-10 Pain Score: 5  Pain Type: Chronic pain Pain Location: Knee (both) Pain Descriptors / Indicators: Aching Pain Onset: More than a month ago Pain Frequency: Constant Pain Relieving Factors: tylenol, osteobi-flex  Pain Relieving Factors: tylenol, osteobi-flex  BMI - recorded: 27.69 Nutritional Status: BMI 25 -29 Overweight Nutritional Risks: None Diabetes: No  How often do you need to have someone help you when you read instructions, pamphlets, or other written materials from your doctor or pharmacy?: 1 - Never  Interpreter Needed?: No  Comments: lives with wife Information entered by :: B.Shaquinta Peruski,LPN   Activities of Daily Living    01/19/2023    7:38 AM 11/15/2022    1:15 PM  In your present state of health, do you have any difficulty performing the following activities:  Hearing? 0 0  Vision? 0 0  Difficulty concentrating or making decisions? 0 0  Walking or  climbing stairs? 0 1  Dressing or bathing? 0 0  Doing errands, shopping? 0 0  Preparing Food and eating ? N   Using the Toilet? N   In the past six months, have you accidently leaked urine? N   Do you have problems with loss of bowel control? N   Managing your Medications? N   Managing your Finances? N   Housekeeping or managing your Housekeeping? N     Patient Care Team: Erasmo Downer, MD as PCP - General (Family Medicine) Lemar Livings, Merrily Pew, MD as Consulting Physician (General Surgery) Francesco Sor  P, MD (Orthopedic Surgery)  Indicate any recent Medical Services you may have received from other than [facility] providers in the past year (date may be approximate).     Assessment:   This is a routine wellness examination for Ryan English.  Hearing/Vision screen Hearing Screening - Comments:: Adequate hearing Vision Screening - Comments:: Adequate vision-does not have eye dr  Dietary issues and exercise activities discussed:     Goals Addressed   None    Depression Screen    01/19/2023   11:59 AM 11/15/2022    1:15 PM 08/04/2022    2:56 PM 06/30/2022    1:56 PM 05/17/2022    1:56 PM 02/10/2022   11:16 AM 02/02/2021    1:30 PM  PHQ 2/9 Scores  PHQ - 2 Score 0 0 0 0 0 0 0  PHQ- 9 Score  0 0 0 0  0    Fall Risk    01/19/2023    7:38 AM 11/15/2022    1:15 PM 08/04/2022    2:44 PM 06/30/2022    1:56 PM 05/17/2022    1:56 PM  Fall Risk   Falls in the past year? 0 0 0 0 0  Number falls in past yr: 0 0  0 0  Injury with Fall? 0 0  0 0  Risk for fall due to : No Fall Risks No Fall Risks No Fall Risks  No Fall Risks  Follow up Education provided;Falls prevention discussed Falls evaluation completed Falls evaluation completed  Falls evaluation completed    MEDICARE RISK AT HOME:   TIMED UP AND GO:  Was the test performed?  Yes  Length of time to ambulate 10 feet: 8 sec Gait steady and fast without use of assistive device    Cognitive Function:        01/19/2023    12:03 PM 02/10/2022   11:18 AM  6CIT Screen  What Year? 0 points 0 points  What month? 0 points 0 points  What time? 0 points 0 points  Count back from 20 0 points 0 points  Months in reverse 0 points 0 points  Repeat phrase 0 points 0 points  Total Score 0 points 0 points    Immunizations Immunization History  Administered Date(s) Administered   Influenza, High Dose Seasonal PF 06/07/2017, 04/26/2018, 04/26/2018   Influenza,inj,Quad PF,6+ Mos 04/05/2015   Influenza-Unspecified 04/05/2015, 04/13/2019   PFIZER Comirnaty(Gray Top)Covid-19 Tri-Sucrose Vaccine 02/02/2021   PFIZER(Purple Top)SARS-COV-2 Vaccination 07/25/2019, 08/15/2019, 05/14/2020   Pfizer Covid-19 Vaccine Bivalent Booster 63yrs & up 05/12/2021   Pneumococcal Conjugate-13 11/17/2016   Pneumococcal Polysaccharide-23 11/22/2017   Td 05/17/2007   Tdap 05/17/2007, 09/23/2017   Zoster Recombinant(Shingrix) 05/18/2019, 10/03/2019   Zoster, Live 08/21/2012    TDAP status: Up to date  Flu Vaccine status: Up to date  Pneumococcal vaccine status: Up to date  Covid-19 vaccine status: Completed vaccines  Qualifies for Shingles Vaccine? Yes   Zostavax completed Yes   Shingrix Completed?: Yes  Screening Tests Health Maintenance  Topic Date Due   COVID-19 Vaccine (6 - 2023-24 season) 03/05/2022   INFLUENZA VACCINE  02/03/2023   Medicare Annual Wellness (AWV)  01/19/2024   Colonoscopy  04/08/2026   DTaP/Tdap/Td (4 - Td or Tdap) 09/24/2027   Pneumonia Vaccine 34+ Years old  Completed   Hepatitis C Screening  Completed   Zoster Vaccines- Shingrix  Completed   HPV VACCINES  Aged Out    Health Maintenance  Health Maintenance Due  Topic Date Due  COVID-19 Vaccine (6 - 2023-24 season) 03/05/2022    Colorectal cancer screening: Type of screening: Colonoscopy. Completed yes. Repeat every 5 years  Lung Cancer Screening: (Low Dose CT Chest recommended if Age 57-80 years, 20 pack-year currently smoking OR have quit  w/in 15years.) does not qualify.   Lung Cancer Screening Referral: no  Additional Screening:  Hepatitis C Screening: does not qualify; Completed yes  Vision Screening: Recommended annual ophthalmology exams for early detection of glaucoma and other disorders of the eye. Is the patient up to date with their annual eye exam?  No  Who is the provider or what is the name of the office in which the patient attends annual eye exams? none If pt is not established with a provider, would they like to be referred to a provider to establish care? No .  Pt declines Dental Screening: Recommended annual dental exams for proper oral hygiene  Diabetic Foot Exam: n/a  Community Resource Referral / Chronic Care Management: CRR required this visit?  No   CCM required this visit?  No    Plan:     I have personally reviewed and noted the following in the patient's chart:   Medical and social history Use of alcohol, tobacco or illicit drugs  Current medications and supplements including opioid prescriptions. Patient is not currently taking opioid prescriptions. Functional ability and status Nutritional status Physical activity Advanced directives List of other physicians Hospitalizations, surgeries, and ER visits in previous 12 months Vitals Screenings to include cognitive, depression, and falls Referrals and appointments  In addition, I have reviewed and discussed with patient certain preventive protocols, quality metrics, and best practice recommendations. A written personalized care plan for preventive services as well as general preventive health recommendations were provided to patient.    Sue Lush, LPN   5/62/1308   After Visit Summary: (MyChart) Due to this being a telephonic visit, the after visit summary with patients personalized plan was offered to patient via MyChart   Nurse Notes: The patient states he is doing well and has no concerns or questions at this  time.

## 2023-01-19 NOTE — Patient Instructions (Signed)
Ryan English , Thank you for taking time to come for your Medicare Wellness Visit. I appreciate your ongoing commitment to your health goals. Please review the following plan we discussed and let me know if I can assist you in the future.   These are the goals we discussed:  Goals   None     This is a list of the screening recommended for you and due dates:  Health Maintenance  Topic Date Due   COVID-19 Vaccine (6 - 2023-24 season) 03/05/2022   Flu Shot  02/03/2023   Medicare Annual Wellness Visit  01/19/2024   Colon Cancer Screening  04/08/2026   DTaP/Tdap/Td vaccine (4 - Td or Tdap) 09/24/2027   Pneumonia Vaccine  Completed   Hepatitis C Screening  Completed   Zoster (Shingles) Vaccine  Completed   HPV Vaccine  Aged Out    Advanced directives: no  Conditions/risks identified: low falls risk  Next appointment: Follow up in one year for your annual wellness visit. 01/24/2024 @ 11:15am in person  Preventive Care 65 Years and Older, Male  Preventive care refers to lifestyle choices and visits with your health care provider that can promote health and wellness. What does preventive care include? A yearly physical exam. This is also called an annual well check. Dental exams once or twice a year. Routine eye exams. Ask your health care provider how often you should have your eyes checked. Personal lifestyle choices, including: Daily care of your teeth and gums. Regular physical activity. Eating a healthy diet. Avoiding tobacco and drug use. Limiting alcohol use. Practicing safe sex. Taking low doses of aspirin every day. Taking vitamin and mineral supplements as recommended by your health care provider. What happens during an annual well check? The services and screenings done by your health care provider during your annual well check will depend on your age, overall health, lifestyle risk factors, and family history of disease. Counseling  Your health care provider may ask  you questions about your: Alcohol use. Tobacco use. Drug use. Emotional well-being. Home and relationship well-being. Sexual activity. Eating habits. History of falls. Memory and ability to understand (cognition). Work and work Astronomer. Screening  You may have the following tests or measurements: Height, weight, and BMI. Blood pressure. Lipid and cholesterol levels. These may be checked every 5 years, or more frequently if you are over 38 years old. Skin check. Lung cancer screening. You may have this screening every year starting at age 72 if you have a 30-pack-year history of smoking and currently smoke or have quit within the past 15 years. Fecal occult blood test (FOBT) of the stool. You may have this test every year starting at age 61. Flexible sigmoidoscopy or colonoscopy. You may have a sigmoidoscopy every 5 years or a colonoscopy every 10 years starting at age 19. Prostate cancer screening. Recommendations will vary depending on your family history and other risks. Hepatitis C blood test. Hepatitis B blood test. Sexually transmitted disease (STD) testing. Diabetes screening. This is done by checking your blood sugar (glucose) after you have not eaten for a while (fasting). You may have this done every 1-3 years. Abdominal aortic aneurysm (AAA) screening. You may need this if you are a current or former smoker. Osteoporosis. You may be screened starting at age 46 if you are at high risk. Talk with your health care provider about your test results, treatment options, and if necessary, the need for more tests. Vaccines  Your health care provider may recommend  certain vaccines, such as: Influenza vaccine. This is recommended every year. Tetanus, diphtheria, and acellular pertussis (Tdap, Td) vaccine. You may need a Td booster every 10 years. Zoster vaccine. You may need this after age 5. Pneumococcal 13-valent conjugate (PCV13) vaccine. One dose is recommended after age  47. Pneumococcal polysaccharide (PPSV23) vaccine. One dose is recommended after age 40. Talk to your health care provider about which screenings and vaccines you need and how often you need them. This information is not intended to replace advice given to you by your health care provider. Make sure you discuss any questions you have with your health care provider. Document Released: 07/18/2015 Document Revised: 03/10/2016 Document Reviewed: 04/22/2015 Elsevier Interactive Patient Education  2017 [employer].  Fall Prevention in the Home Falls can cause injuries. They can happen to people of all ages. There are many things you can do to make your home safe and to help prevent falls. What can I do on the outside of my home? Regularly fix the edges of walkways and driveways and fix any cracks. Remove anything that might make you trip as you walk through a door, such as a raised step or threshold. Trim any bushes or trees on the path to your home. Use bright outdoor lighting. Clear any walking paths of anything that might make someone trip, such as rocks or tools. Regularly check to see if handrails are loose or broken. Make sure that both sides of any steps have handrails. Any raised decks and porches should have guardrails on the edges. Have any leaves, snow, or ice cleared regularly. Use sand or salt on walking paths during winter. Clean up any spills in your garage right away. This includes oil or grease spills. What can I do in the bathroom? Use night lights. Install grab bars by the toilet and in the tub and shower. Do not use towel bars as grab bars. Use non-skid mats or decals in the tub or shower. If you need to sit down in the shower, use a plastic, non-slip stool. Keep the floor dry. Clean up any water that spills on the floor as soon as it happens. Remove soap buildup in the tub or shower regularly. Attach bath mats securely with double-sided non-slip rug tape. Do not have throw  rugs and other things on the floor that can make you trip. What can I do in the bedroom? Use night lights. Make sure that you have a light by your bed that is easy to reach. Do not use any sheets or blankets that are too big for your bed. They should not hang down onto the floor. Have a firm chair that has side arms. You can use this for support while you get dressed. Do not have throw rugs and other things on the floor that can make you trip. What can I do in the kitchen? Clean up any spills right away. Avoid walking on wet floors. Keep items that you use a lot in easy-to-reach places. If you need to reach something above you, use a strong step stool that has a grab bar. Keep electrical cords out of the way. Do not use floor polish or wax that makes floors slippery. If you must use wax, use non-skid floor wax. Do not have throw rugs and other things on the floor that can make you trip. What can I do with my stairs? Do not leave any items on the stairs. Make sure that there are handrails on both sides of the  stairs and use them. Fix handrails that are broken or loose. Make sure that handrails are as long as the stairways. Check any carpeting to make sure that it is firmly attached to the stairs. Fix any carpet that is loose or worn. Avoid having throw rugs at the top or bottom of the stairs. If you do have throw rugs, attach them to the floor with carpet tape. Make sure that you have a light switch at the top of the stairs and the bottom of the stairs. If you do not have them, ask someone to add them for you. What else can I do to help prevent falls? Wear shoes that: Do not have high heels. Have rubber bottoms. Are comfortable and fit you well. Are closed at the toe. Do not wear sandals. If you use a stepladder: Make sure that it is fully opened. Do not climb a closed stepladder. Make sure that both sides of the stepladder are locked into place. Ask someone to hold it for you, if  possible. Clearly mark and make sure that you can see: Any grab bars or handrails. First and last steps. Where the edge of each step is. Use tools that help you move around (mobility aids) if they are needed. These include: Canes. Walkers. Scooters. Crutches. Turn on the lights when you go into a dark area. Replace any light bulbs as soon as they burn out. Set up your furniture so you have a clear path. Avoid moving your furniture around. If any of your floors are uneven, fix them. If there are any pets around you, be aware of where they are. Review your medicines with your doctor. Some medicines can make you feel dizzy. This can increase your chance of falling. Ask your doctor what other things that you can do to help prevent falls. This information is not intended to replace advice given to you by your health care provider. Make sure you discuss any questions you have with your health care provider. Document Released: 04/17/2009 Document Revised: 11/27/2015 Document Reviewed: 07/26/2014 Elsevier Interactive Patient Education  2017 [employer].

## 2023-02-14 ENCOUNTER — Other Ambulatory Visit: Payer: Self-pay | Admitting: Family Medicine

## 2023-02-17 ENCOUNTER — Ambulatory Visit (INDEPENDENT_AMBULATORY_CARE_PROVIDER_SITE_OTHER): Payer: Medicare HMO | Admitting: Family Medicine

## 2023-02-17 ENCOUNTER — Encounter: Payer: Self-pay | Admitting: Family Medicine

## 2023-02-17 VITALS — BP 121/66 | HR 62 | Temp 98.3°F | Resp 12 | Ht 72.0 in | Wt 209.3 lb

## 2023-02-17 DIAGNOSIS — I10 Essential (primary) hypertension: Secondary | ICD-10-CM

## 2023-02-17 DIAGNOSIS — Z23 Encounter for immunization: Secondary | ICD-10-CM

## 2023-02-17 DIAGNOSIS — Z8249 Family history of ischemic heart disease and other diseases of the circulatory system: Secondary | ICD-10-CM

## 2023-02-17 DIAGNOSIS — I2510 Atherosclerotic heart disease of native coronary artery without angina pectoris: Secondary | ICD-10-CM

## 2023-02-17 DIAGNOSIS — Z0000 Encounter for general adult medical examination without abnormal findings: Secondary | ICD-10-CM | POA: Diagnosis not present

## 2023-02-17 DIAGNOSIS — R739 Hyperglycemia, unspecified: Secondary | ICD-10-CM

## 2023-02-17 DIAGNOSIS — E782 Mixed hyperlipidemia: Secondary | ICD-10-CM

## 2023-02-17 DIAGNOSIS — Z125 Encounter for screening for malignant neoplasm of prostate: Secondary | ICD-10-CM

## 2023-02-17 NOTE — Progress Notes (Signed)
Complete physical exam  [name]   DOB: 1950/07/27   72 y.o. Male  MRN: [id number]  Subjective:    Chief Complaint  Patient presents with   Annual Exam    Ryan English is a 72 y.o. male who presents today for a complete physical exam. He reports consuming a general diet.  He generally feels well. He reports sleeping well. He does not have additional problems to discuss today.   Discussed the use of AI scribe software for clinical note transcription with the patient, who gave verbal consent to proceed.  History of Present Illness   The patient, with a history of plaque in the arteries around the heart, presents for a routine physical examination. He expresses concerns about the COVID-19 vaccine and inquires about the timing for receiving it. The patient also asks about the RSV vaccine, indicating a basic understanding that it is related to a respiratory virus. He expresses willingness to receive this vaccine at the pharmacy.  The patient also mentions a minor injury, a cut from a tree, which he has self-treated with a band-aid. He expresses concern about his liver function due to regular Tylenol use. He also notes that his diastolic blood pressure seems low at 66, but denies any symptoms of dizziness.  The patient is currently working full-time and contemplates knee surgery, but as long as he can get around, he is okay with his current situation. He notes that his vision and hearing are still good, but he has noticed a slight delay in his eyes adjusting to fine print.  The patient is on atorvastatin 40 mg a day for cholesterol and losartan 50 mg a day for blood pressure. He also takes a baby aspirin, which was previously discussed with his former doctor. He has a family history of heart disease, with two uncles and a brother who have died of heart attacks.        Most recent fall risk assessment:    02/17/2023    1:23 PM  Fall Risk   Falls in the past year? 0   Number falls in past yr: 0  Injury with Fall? 0  Risk for fall due to : No Fall Risks  Follow up Falls evaluation completed     Most recent depression screenings:    02/17/2023    1:23 PM 01/19/2023   11:59 AM  PHQ 2/9 Scores  PHQ - 2 Score 0 0        Patient Care Team: Erasmo Downer, MD as PCP - General (Family Medicine) Lemar Livings, Merrily Pew, MD as Consulting Physician (General Surgery) Ernest Pine Illene Labrador, MD (Orthopedic Surgery)   Outpatient Medications Prior to Visit  Medication Sig   acetaminophen (TYLENOL) 500 MG tablet Take 500 mg by mouth 2 (two) times daily.   albuterol (VENTOLIN HFA) 108 (90 Base) MCG/ACT inhaler Inhale 2 puffs into the lungs every 6 (six) hours as needed for wheezing or shortness of breath.   aspirin 81 MG tablet Take 81 mg by mouth daily.   atorvastatin (LIPITOR) 40 MG tablet Take 1 tablet by mouth daily.   FAMOTIDINE PO Take 20 mg by mouth 2 (two) times daily.   Glucosamine-Chondroitin (OSTEO BI-FLEX REGULAR STRENGTH PO) Take by mouth 2 (two) times daily.   ipratropium (ATROVENT) 0.03 % nasal spray Place 2 sprays into both nostrils every 12 (twelve) hours.   loratadine (CLARITIN) 10 MG tablet Take 1 tablet (10 mg total) by mouth daily.   losartan (  COZAAR) 50 MG tablet TAKE 1 TABLET BY MOUTH EVERY DAY   montelukast (SINGULAIR) 10 MG tablet TAKE 1 TABLET BY MOUTH EVERYDAY AT BEDTIME   [DISCONTINUED] fluticasone (FLONASE) 50 MCG/ACT nasal spray SPRAY 2 SPRAYS INTO EACH NOSTRIL EVERY DAY (Patient not taking: Reported on 02/17/2023)   No facility-administered medications prior to visit.    ROS per HPI     Objective:     BP 121/66 (BP Location: Right Arm, Patient Position: Sitting, Cuff Size: Large)   Pulse 62   Temp 98.3 F (36.8 C) (Temporal)   Resp 12   Ht 6' (1.829 m)   Wt 209 lb 4.8 oz (94.9 kg)   SpO2 99%   BMI 28.39 kg/m    Physical Exam Vitals reviewed.  Constitutional:      General: He is not in acute distress.     Appearance: Normal appearance. He is well-developed. He is not diaphoretic.  HENT:     Head: Normocephalic and atraumatic.     Right Ear: Tympanic membrane, ear canal and external ear normal.     Left Ear: Tympanic membrane, ear canal and external ear normal.     Nose: Nose normal.     Mouth/Throat:     Mouth: Mucous membranes are moist.     Pharynx: Oropharynx is clear. No oropharyngeal exudate.  Eyes:     General: No scleral icterus.    Conjunctiva/sclera: Conjunctivae normal.     Pupils: Pupils are equal, round, and reactive to light.  Neck:     Thyroid: No thyromegaly.  Cardiovascular:     Rate and Rhythm: Normal rate and regular rhythm.     Heart sounds: Normal heart sounds. No murmur heard. Pulmonary:     Effort: Pulmonary effort is normal. No respiratory distress.     Breath sounds: Normal breath sounds. No wheezing or rales.  Abdominal:     General: There is no distension.     Palpations: Abdomen is soft.     Tenderness: There is no abdominal tenderness.  Musculoskeletal:        General: No deformity.     Cervical back: Neck supple.     Right lower leg: No edema.     Left lower leg: No edema.  Lymphadenopathy:     Cervical: No cervical adenopathy.  Skin:    General: Skin is warm and dry.     Findings: No rash.  Neurological:     Mental Status: He is alert and oriented to person, place, and time. Mental status is at baseline.     Gait: Gait normal.  Psychiatric:        Mood and Affect: Mood normal.        Behavior: Behavior normal.        Thought Content: Thought content normal.      No results found for any visits on 02/17/23.     Assessment & Plan:    Routine Health Maintenance and Physical Exam  Immunization History  Administered Date(s) Administered   Fluad Quad(high Dose 65+) 02/17/2023   Influenza, High Dose Seasonal PF 06/07/2017, 04/26/2018, 04/26/2018   Influenza,inj,Quad PF,6+ Mos 04/05/2015   Influenza-Unspecified 04/05/2015, 04/13/2019    PFIZER Comirnaty(Gray Top)Covid-19 Tri-Sucrose Vaccine 02/02/2021   PFIZER(Purple Top)SARS-COV-2 Vaccination 07/25/2019, 08/15/2019, 05/14/2020   Pfizer Covid-19 Vaccine Bivalent Booster 55yrs & up 05/12/2021   Pneumococcal Conjugate-13 11/17/2016   Pneumococcal Polysaccharide-23 11/22/2017   Td 05/17/2007   Tdap 05/17/2007, 09/23/2017   Zoster Recombinant(Shingrix) 05/18/2019, 10/03/2019   Zoster,  Live 08/21/2012    Health Maintenance  Topic Date Due   COVID-19 Vaccine (6 - 2023-24 season) 03/05/2022   Medicare Annual Wellness (AWV)  01/19/2024   Colonoscopy  04/08/2026   DTaP/Tdap/Td (4 - Td or Tdap) 09/24/2027   Pneumonia Vaccine 23+ Years old  Completed   INFLUENZA VACCINE  Completed   Hepatitis C Screening  Completed   Zoster Vaccines- Shingrix  Completed   HPV VACCINES  Aged Out    Discussed health benefits of physical activity, and encouraged him to engage in regular exercise appropriate for his age and condition.  Problem List Items Addressed This Visit       Cardiovascular and Mediastinum   Atherosclerosis of coronary artery   Primary hypertension   Relevant Orders   Comprehensive metabolic panel   Lipid Panel With LDL/HDL Ratio     Other   Combined fat and carbohydrate induced hyperlipemia   Relevant Orders   Comprehensive metabolic panel   Lipid Panel With LDL/HDL Ratio   Family history of heart disease   Other Visit Diagnoses     Annual physical exam    -  Primary   Relevant Orders   Comprehensive metabolic panel   Lipid Panel With LDL/HDL Ratio   PSA   Hemoglobin A1c   Need for influenza vaccination       Relevant Orders   Flu Vaccine QUAD High Dose(Fluad) (Completed)   Screening for prostate cancer       Relevant Orders   PSA   Hyperglycemia       Relevant Orders   Hemoglobin A1c          Vaccination Status Discussed upcoming COVID-19 booster for new variants and RSV vaccine. Both vaccines are recommended for the patient. -Plan to get  COVID-19 booster next month when available. -Get RSV vaccine now or wait to get it with COVID-19 booster.  Hyperlipidemia Patient on Atorvastatin 40mg  daily. Discussed the importance of continuing medication due to family history of heart attacks. -Continue Atorvastatin 40mg  daily.  Hypertension Patient on Losartan 50mg  daily. Blood pressure well controlled. -Continue Losartan 50mg  daily.  General Health Maintenance -Continue baby aspirin due to history of plaque in arteries around the heart. -Check labs today for kidney and liver function, cholesterol, and blood sugar. -Check PSA today for prostate cancer screening. -Schedule six-month follow-up appointment.  Minor Skin Injury Patient sustained a minor skin injury. -Provided band-aid for self-care.        Return in about 6 months (around 08/20/2023) for chronic disease f/u.     Shirlee Latch, MD

## 2023-02-18 LAB — COMPREHENSIVE METABOLIC PANEL
ALT: 20 IU/L (ref 0–44)
AST: 19 IU/L (ref 0–40)
Albumin: 4.6 g/dL (ref 3.8–4.8)
Alkaline Phosphatase: 53 IU/L (ref 44–121)
BUN/Creatinine Ratio: 11 (ref 10–24)
BUN: 11 mg/dL (ref 8–27)
Bilirubin Total: 0.7 mg/dL (ref 0.0–1.2)
CO2: 22 mmol/L (ref 20–29)
Calcium: 9.6 mg/dL (ref 8.6–10.2)
Chloride: 102 mmol/L (ref 96–106)
Creatinine, Ser: 1 mg/dL (ref 0.76–1.27)
Globulin, Total: 2.4 g/dL (ref 1.5–4.5)
Glucose: 127 mg/dL — ABNORMAL HIGH (ref 70–99)
Potassium: 4.3 mmol/L (ref 3.5–5.2)
Sodium: 139 mmol/L (ref 134–144)
Total Protein: 7 g/dL (ref 6.0–8.5)
eGFR: 80 mL/min/{1.73_m2} (ref >59)

## 2023-02-18 LAB — PSA: Prostate Specific Ag, Serum: 1.5 ng/mL (ref 0.0–4.0)

## 2023-02-18 LAB — LIPID PANEL WITH LDL/HDL RATIO
Cholesterol, Total: 128 mg/dL (ref 100–199)
HDL: 44 mg/dL (ref >39)
LDL Chol Calc (NIH): 61 mg/dL (ref 0–99)
LDL/HDL Ratio: 1.4 ratio (ref 0.0–3.6)
Triglycerides: 132 mg/dL (ref 0–149)
VLDL Cholesterol Cal: 23 mg/dL (ref 5–40)

## 2023-02-18 LAB — HEMOGLOBIN A1C
Est. average glucose Bld gHb Est-mCnc: 111 mg/dL
Hgb A1c MFr Bld: 5.5 % (ref 4.8–5.6)

## 2023-02-25 ENCOUNTER — Other Ambulatory Visit: Payer: Self-pay | Admitting: Family Medicine

## 2023-02-25 DIAGNOSIS — J3089 Other allergic rhinitis: Secondary | ICD-10-CM

## 2023-02-25 NOTE — Telephone Encounter (Signed)
Requested Prescriptions  Pending Prescriptions Disp Refills   montelukast (SINGULAIR) 10 MG tablet [Pharmacy Med Name: MONTELUKAST SOD 10 MG TABLET] 90 tablet 1    Sig: TAKE 1 TABLET BY MOUTH EVERYDAY AT BEDTIME     Pulmonology:  Leukotriene Inhibitors Passed - 02/25/2023  2:52 AM      Passed - Valid encounter within last 12 months    Recent Outpatient Visits           1 week ago Annual physical exam   [facility] [place], Marzella Schlein, MD   2 months ago Persistent cough   [facility] [place], [place], PA-C   3 months ago Plantar wart   [facility] [place], Marzella Schlein, MD   6 months ago COVID   [facility] Alfredia Ferguson, PA-C   7 months ago Primary hypertension   [facility] [place], Marzella Schlein, MD       Future Appointments             In 5 months [place], Marzella Schlein, MD [facility]

## 2023-04-08 ENCOUNTER — Encounter: Payer: Self-pay | Admitting: Family Medicine

## 2023-04-08 ENCOUNTER — Ambulatory Visit (INDEPENDENT_AMBULATORY_CARE_PROVIDER_SITE_OTHER): Payer: Medicare HMO | Admitting: Family Medicine

## 2023-04-08 VITALS — BP 120/69 | HR 76 | Ht 72.0 in | Wt 203.0 lb

## 2023-04-08 DIAGNOSIS — R051 Acute cough: Secondary | ICD-10-CM

## 2023-04-08 DIAGNOSIS — J4541 Moderate persistent asthma with (acute) exacerbation: Secondary | ICD-10-CM

## 2023-04-08 DIAGNOSIS — J069 Acute upper respiratory infection, unspecified: Secondary | ICD-10-CM | POA: Diagnosis not present

## 2023-04-08 DIAGNOSIS — B9689 Other specified bacterial agents as the cause of diseases classified elsewhere: Secondary | ICD-10-CM

## 2023-04-08 MED ORDER — PREDNISONE 50 MG PO TABS: 50.0000 mg | ORAL_TABLET | Freq: Every day | ORAL | 0 refills | Status: DC

## 2023-04-08 MED ORDER — AMOXICILLIN-POT CLAVULANATE 875-125 MG PO TABS: 1.0000 | ORAL_TABLET | Freq: Two times a day (BID) | ORAL | 0 refills | Status: DC

## 2023-04-08 MED ORDER — ALBUTEROL SULFATE HFA 108 (90 BASE) MCG/ACT IN AERS
2.0000 | INHALATION_SPRAY | Freq: Four times a day (QID) | RESPIRATORY_TRACT | 0 refills | Status: DC | PRN
Start: 2023-04-08 — End: 2023-05-02

## 2023-04-08 NOTE — Progress Notes (Signed)
Established patient visit  [name]   DOB: [birth year]   72 y.o. Male  MRN: [id number] Visit Date: 04/08/2023  Today's healthcare provider: Jacky Kindle, FNP  Introduced to nurse practitioner role and practice setting.  All questions answered.  Discussed provider/patient relationship and expectations.  Subjective    Cough   HPI     Cough    Additional comments: Been sick since last week, congestion, chest pain taken over the counter meds       Last edited by Clois Comber on 04/08/2023 10:20 AM.      The patient, with a history of allergies managed with Claritin, Singulair, and an as-needed albuterol inhaler, presents with a week-long history of feeling unwell. He reports daily fevers and chills, but did not measure his temperature. Despite feeling unwell, he has been able to maintain his normal activities, including work and travel. He has been able to maintain adequate hydration and nutrition. He reports exposure to sick individuals at his workplace, but no known exposure to COVID-19. Over-the-counter medications, including Mucinex D, Tylenol, and Robitussin, have been used for symptom management.  The patient also reports a sensation of chest congestion, but denies any chest pain or discomfort suggestive of cardiac origin. He describes his symptoms as feeling like his lungs are full of mucus. He also reports a sensation of being underwater and having blocked ears, especially noticeable during air travel.  Medications: Outpatient Medications Prior to Visit  Medication Sig   acetaminophen (TYLENOL) 500 MG tablet Take 500 mg by mouth 2 (two) times daily.   aspirin 81 MG tablet Take 81 mg by mouth daily.   atorvastatin (LIPITOR) 40 MG tablet Take 1 tablet by mouth daily.   FAMOTIDINE PO Take 20 mg by mouth 2 (two) times daily.   Glucosamine-Chondroitin (OSTEO BI-FLEX REGULAR STRENGTH PO) Take by mouth 2 (two) times daily.   ipratropium (ATROVENT) 0.03 % nasal  spray Place 2 sprays into both nostrils every 12 (twelve) hours.   loratadine (CLARITIN) 10 MG tablet Take 1 tablet (10 mg total) by mouth daily.   losartan (COZAAR) 50 MG tablet TAKE 1 TABLET BY MOUTH EVERY DAY   montelukast (SINGULAIR) 10 MG tablet TAKE 1 TABLET BY MOUTH EVERYDAY AT BEDTIME   [DISCONTINUED] albuterol (VENTOLIN HFA) 108 (90 Base) MCG/ACT inhaler Inhale 2 puffs into the lungs every 6 (six) hours as needed for wheezing or shortness of breath.   No facility-administered medications prior to visit.    Review of Systems  Respiratory:  Positive for cough.      Objective    BP 120/69 (BP Location: Right Arm, Patient Position: Sitting, Cuff Size: Normal)   Pulse 76   Ht 6' (1.829 m)   Wt 203 lb (92.1 kg)   BMI 27.53 kg/m   Physical Exam Vitals and nursing note reviewed.  Constitutional:      Appearance: Normal appearance. He is overweight.  HENT:     Head: Normocephalic and atraumatic.     Right Ear: Tympanic membrane, ear canal and external ear normal.     Left Ear: Tympanic membrane, ear canal and external ear normal.     Nose: Congestion present. No rhinorrhea.     Mouth/Throat:     Mouth: Mucous membranes are moist.     Pharynx: Oropharynx is clear. No oropharyngeal exudate or posterior oropharyngeal erythema.  Eyes:     Conjunctiva/sclera: Conjunctivae normal.  Neck:     Vascular: No carotid bruit.  Cardiovascular:     Rate and Rhythm: Normal rate and regular rhythm.     Pulses: Normal pulses.     Heart sounds: Normal heart sounds.  Pulmonary:     Effort: Pulmonary effort is normal. No respiratory distress.     Breath sounds: Normal breath sounds. No stridor. No wheezing, rhonchi or rales.     Comments: Upper airway audible Chest:     Chest wall: No tenderness.  Musculoskeletal:        General: Normal range of motion.     Cervical back: Normal range of motion and neck supple. No rigidity or tenderness.  Lymphadenopathy:     Cervical: No cervical  adenopathy.  Skin:    General: Skin is warm and dry.     Capillary Refill: Capillary refill takes less than 2 seconds.  Neurological:     General: No focal deficit present.     Mental Status: He is alert and oriented to person, place, and time. Mental status is at baseline.  Psychiatric:        Mood and Affect: Mood normal.        Behavior: Behavior normal.        Thought Content: Thought content normal.        Judgment: Judgment normal.     No results found for any visits on 04/08/23.  Assessment & Plan    Upper Respiratory Infection Symptoms of fever, congestion, and cough for the past week. No known COVID exposure. Patient has been self-treating with over-the-counter medications including Mucinex D, Tylenol, and Robitussin. Continues to take prescribed Claritin and Singulair. Albuterol inhaler is running low. -Prescribe antibiotic for suspected sinusitis. -Prescribe short course of oral Prednisone to reduce inflammation given hx of RAD/stable asthma -Refill Albuterol inhaler.  COVID-19 Vaccination Patient inquired about receiving the COVID-19 vaccine. -Recommend waiting until recovery from current illness before receiving the vaccine.  RSV Vaccination Patient inquired about receiving the RSV vaccine. -Recommend waiting until recovery from current illness before receiving the vaccine.  General Health Maintenance Patient up to date on other vaccinations including flu, shingles, and pneumonia. -No further action needed at this time.  Addressed extensive questions regarding prevention vaccination and care for acute medical problems today requiring 30 minutes reviewing his medical record, counseling patient regarding his conditions and coordination of care.    Leilani Merl, FNP, have reviewed all documentation for this visit. The documentation on 04/08/23 for the exam, diagnosis, procedures, and orders are all accurate and complete.  Jacky Kindle, FNP  [facility] [phone] (phone) [phone] (fax)  [facility]

## 2023-05-01 ENCOUNTER — Other Ambulatory Visit: Payer: Self-pay | Admitting: Family Medicine

## 2023-05-01 DIAGNOSIS — R051 Acute cough: Secondary | ICD-10-CM

## 2023-05-13 DIAGNOSIS — D2271 Melanocytic nevi of right lower limb, including hip: Secondary | ICD-10-CM | POA: Diagnosis not present

## 2023-05-13 DIAGNOSIS — D2262 Melanocytic nevi of left upper limb, including shoulder: Secondary | ICD-10-CM | POA: Diagnosis not present

## 2023-05-13 DIAGNOSIS — Z85828 Personal history of other malignant neoplasm of skin: Secondary | ICD-10-CM | POA: Diagnosis not present

## 2023-05-13 DIAGNOSIS — D2261 Melanocytic nevi of right upper limb, including shoulder: Secondary | ICD-10-CM | POA: Diagnosis not present

## 2023-05-13 DIAGNOSIS — D2272 Melanocytic nevi of left lower limb, including hip: Secondary | ICD-10-CM | POA: Diagnosis not present

## 2023-05-13 DIAGNOSIS — L821 Other seborrheic keratosis: Secondary | ICD-10-CM | POA: Diagnosis not present

## 2023-05-13 DIAGNOSIS — D225 Melanocytic nevi of trunk: Secondary | ICD-10-CM | POA: Diagnosis not present

## 2023-06-04 IMAGING — CR DG CHEST 2V
1 series · 2 of 2 positions shown · non-contrast
Comparison: Chest x-ray dated June 06, 2015

CLINICAL DATA: Cough

EXAM:
CHEST - 2 VIEW

[Series 1: dg chest 2 view · 0.14mm/px · 2 of 2 slices shown]
[im 1/2]
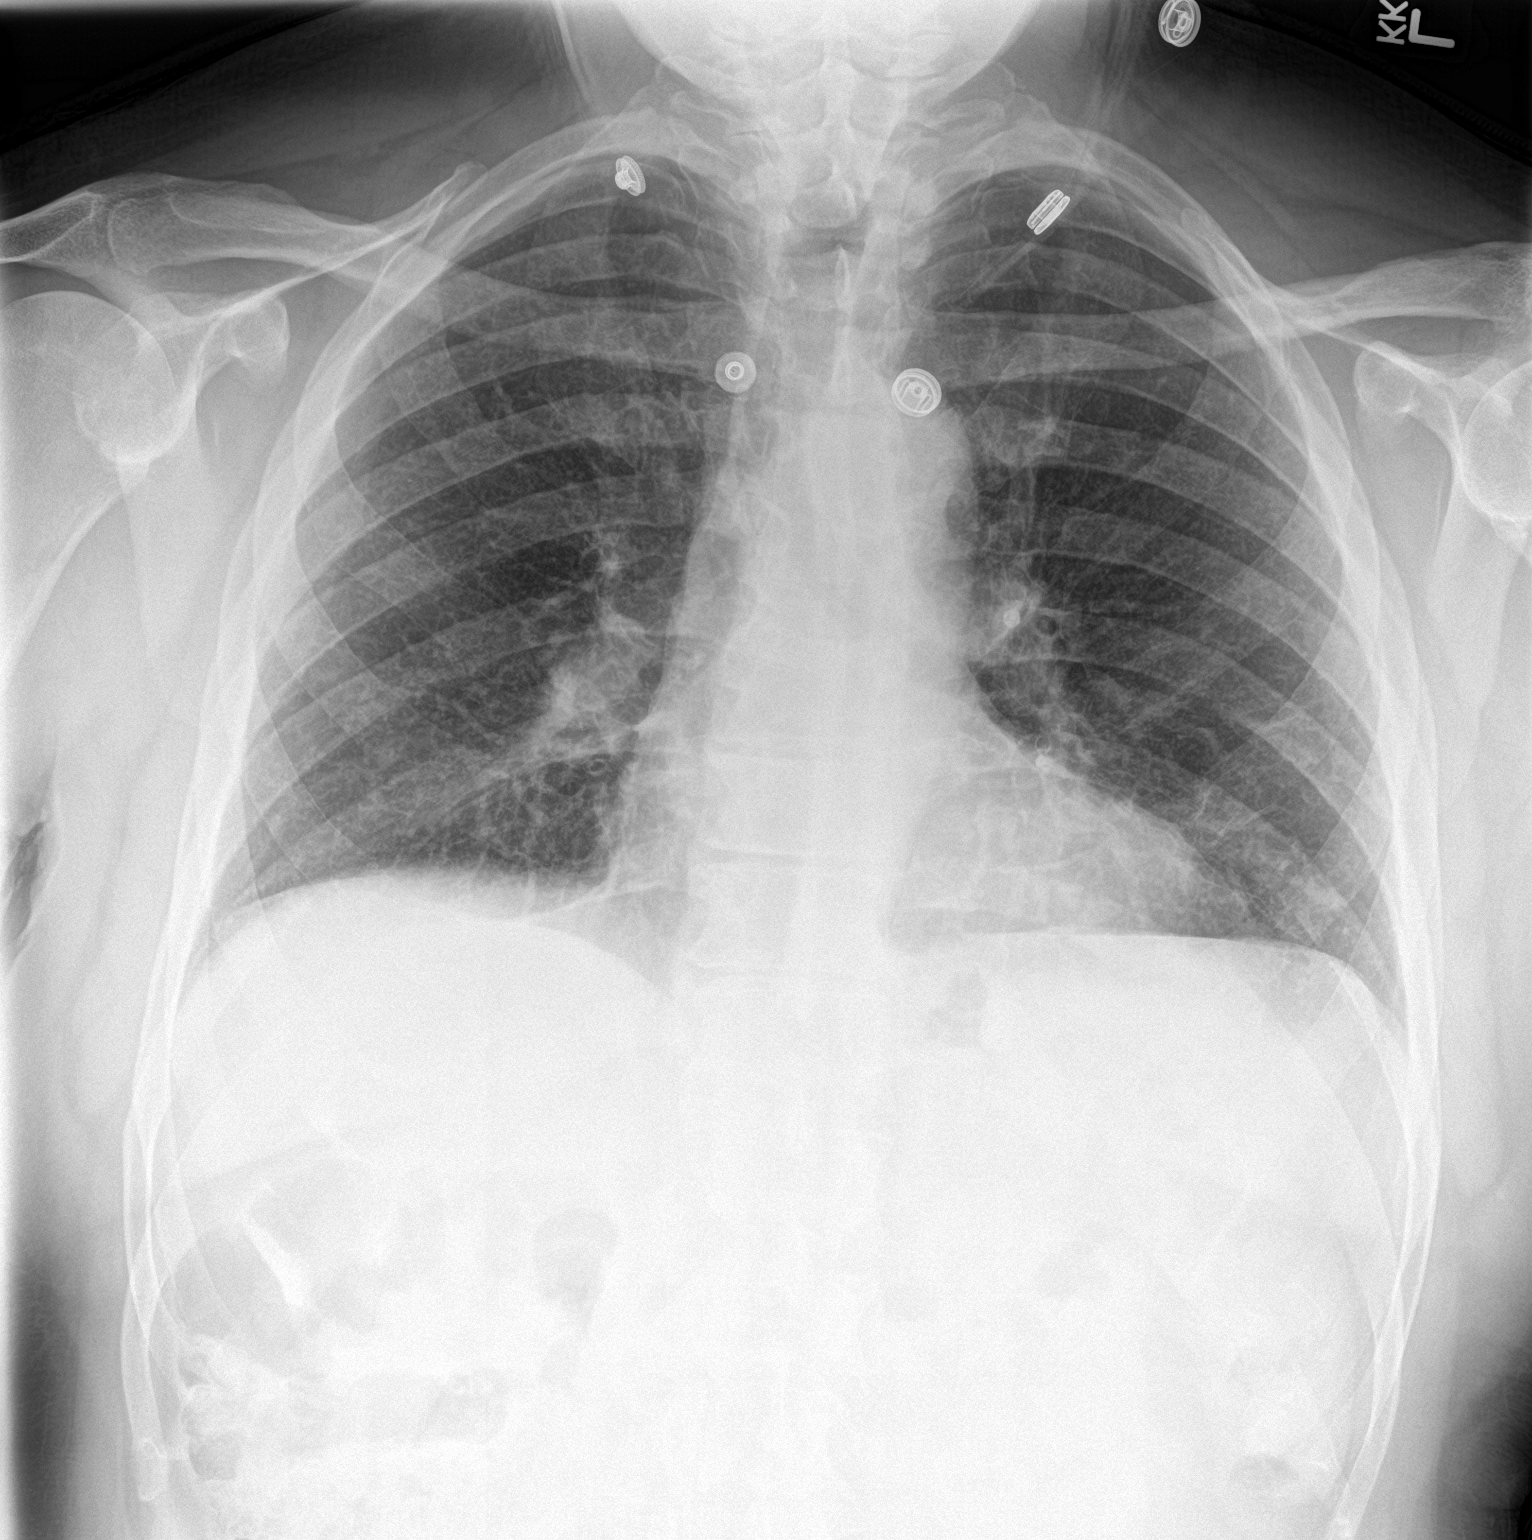
[im 2/2]
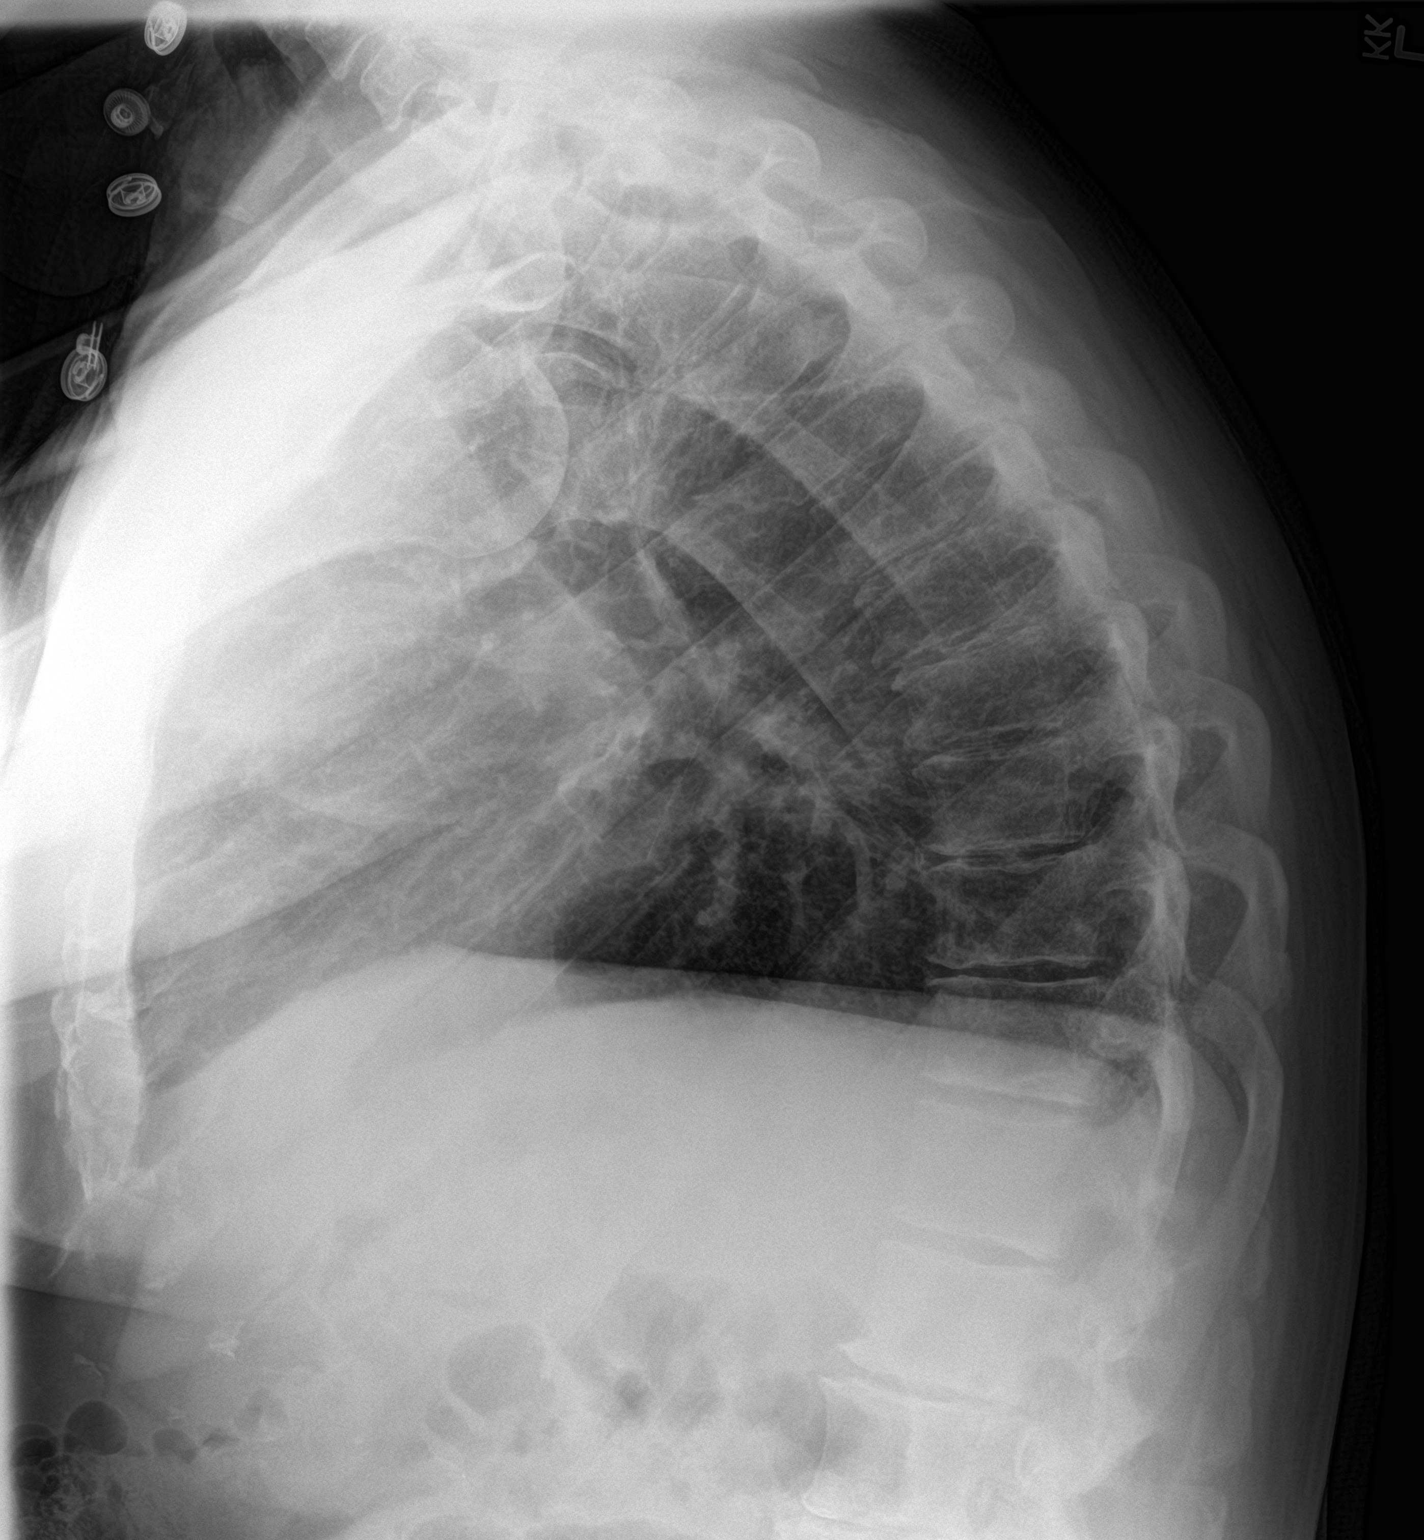

[2 of 2 positions shown; findings below may reference images not displayed]

FINDINGS: Cardiac and mediastinal contours are within normal limits. Mild
bibasilar opacities, likely due to atelectasis. Lungs otherwise
clear. No pleural effusion or pneumothorax.
IMPRESSION: No active cardiopulmonary disease.

## 2023-06-12 NOTE — Progress Notes (Unsigned)
Cardiology Office Note  Date:  06/13/2023   ID:  Ryan English, Ryan English 1950/10/31, MRN [id number]  PCP:  Ryan Downer, MD   Chief Complaint  Patient presents with   New Patient (Initial Visit)    Establish care for continue care for coronary calcification and heart murmur. Former patient of Dr. Gwen English patient. Medications reviewed by the patient verbally. Patient c/o chest pain/tightness at times.     HPI:  [name] is a 72 year old gentleman with past medical history of Asthma Hyperlipidemia Coronary calcification on CT scan chest 2015, nonsmoker Who presents by referral of Dr. 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, describes it as a tightness in the central part of his chest of unclear etiology  No prior echocardiogram available, no prior stress testing  Lab work reviewed Total cholesterol 128 LDL 61 A1c 5.5  Brother died 68 yo, CAD, MI Never knew parents  EKG personally reviewed by myself on todays visit EKG Interpretation Date/Time:  Monday June 13 2023 15:12:10 EST Ventricular Rate:  69 PR Interval:  180 QRS Duration:  80 QT Interval:  384 QTC Calculation: 411 R Axis:   -14  Text Interpretation: Normal sinus rhythm Minimal voltage criteria for LVH, may be normal variant ( R in aVL ) When compared with ECG of 06-Jun-2015 16:00, No significant change was found Confirmed by Ryan English [phone]) on 06/13/2023 3:21:30 PM    PMH:   has a past medical history of Arthritis, GERD (gastroesophageal reflux disease), and Hyperlipidemia.  PSH:    Past Surgical History:  Procedure Laterality Date   APPENDECTOMY  1999   COLONOSCOPY WITH PROPOFOL N/A 04/08/2021   Procedure: COLONOSCOPY WITH PROPOFOL;  Surgeon: Ryan Reil, MD;  Location[facility] ENDOSCOPY;  Service:  Gastroenterology;  Laterality: N/A;    Current Outpatient Medications  Medication Sig Dispense Refill   acetaminophen (TYLENOL) 500 MG tablet Take 500 mg by mouth 2 (two) times daily.     albuterol (VENTOLIN HFA) 108 (90 Base) MCG/ACT inhaler TAKE 2 PUFFS BY MOUTH EVERY 6 HOURS AS NEEDED FOR WHEEZE OR SHORTNESS OF BREATH 18 each 0   aspirin 81 MG tablet Take 81 mg by mouth daily.     atorvastatin (LIPITOR) 40 MG tablet Take 1 tablet by mouth daily.     FAMOTIDINE PO Take 20 mg by mouth 2 (two) times daily.     Glucosamine-Chondroitin (OSTEO BI-FLEX REGULAR STRENGTH PO) Take by mouth 2 (two) times daily.     ipratropium (ATROVENT) 0.03 % nasal spray Place 2 sprays into both nostrils every 12 (twelve) hours. 30 mL 12   loratadine (CLARITIN) 10 MG tablet Take 1 tablet (10 mg total) by mouth daily. 30 tablet 11   losartan (COZAAR) 50 MG tablet TAKE 1 TABLET BY MOUTH EVERY DAY 90 tablet 1   montelukast (SINGULAIR) 10 MG tablet TAKE 1 TABLET BY MOUTH EVERYDAY AT BEDTIME 90 tablet 1   amoxicillin-clavulanate (AUGMENTIN) 875-125 MG tablet Take 1 tablet by mouth 2 (two) times daily. (Patient not taking: Reported on 06/13/2023) 14 tablet 0   predniSONE (DELTASONE) 50 MG tablet Take 1 tablet (50 mg total) by mouth daily with breakfast. (Patient not taking: Reported on 06/13/2023) 5 tablet 0   No current facility-administered medications for this visit.     Allergies:   Patient has no known allergies.   Social History:  The patient  reports that he has never smoked. He has never used smokeless tobacco. He reports current alcohol use of about 6.0 standard drinks of alcohol per week. He reports that he does not use drugs.   Family History:   family history includes Cancer in his mother; Diabetes in his brother, brother, sister, and sister; Heart disease in his brother; Heart disease (age of onset: 50) in his brother.    Review of Systems: Review of Systems  Constitutional: Negative.   HENT: Negative.     Respiratory: Negative.    Cardiovascular:  Positive for chest pain.  Gastrointestinal: Negative.   Musculoskeletal: Negative.   Neurological: Negative.   Psychiatric/Behavioral: Negative.    All other systems reviewed and are negative.    PHYSICAL EXAM: VS:  BP 134/74 (BP Location: Right Arm, Patient Position: Sitting, Cuff Size: Normal)   Pulse 68   Ht 6' (1.829 m)   Wt 209 lb 4 oz (94.9 kg)   SpO2 97%   BMI 28.38 kg/m  , BMI Body mass index is 28.38 kg/m. GEN: Well nourished, well developed, in no acute distress HEENT: normal Neck: no JVD, carotid bruits, or masses Cardiac: RRR; no murmurs, rubs, or gallops,no edema  Respiratory:  clear to auscultation bilaterally, normal work of breathing GI: soft, nontender, nondistended, + BS MS: no deformity or atrophy Skin: warm and dry, no rash Neuro:  Strength and sensation are intact Psych: euthymic mood, full affect  Recent Labs: 02/17/2023: ALT 20; BUN 11; Creatinine, Ser 1.00; Potassium 4.3; Sodium 139    Lipid Panel Lab Results  Component Value Date   CHOL 128 02/17/2023   HDL 44 02/17/2023   LDLCALC 61 02/17/2023   TRIG 132 02/17/2023      Wt Readings from Last 3 Encounters:  06/13/23 209 lb 4 oz (94.9 kg)  04/08/23 203 lb (92.1 kg)  02/17/23 209 lb 4.8 oz (94.9 kg)       ASSESSMENT AND PLAN:  Problem List Items Addressed This Visit       Cardiology Problems   Primary hypertension   Relevant Orders   EKG 12-Lead (Completed)   Atherosclerosis of coronary artery - Primary   Relevant Orders   EKG 12-Lead (Completed)     Other   Overweight   Other Visit Diagnoses     Mixed hyperlipidemia           Chest pain/angina Significant coronary calcification noted in LAD in 2015 CT scan images pulled up and reviewed with him Minimal aortic atherosclerosis Reports having recent episodes of chest pain concerning for angina Recommended ischemic evaluation with cardiac CTA If unable to interpret CT  images secondary to heavy calcium, may need myocardial perfusion imaging study  Hyperlipidemia Stressed importance of LDL less than 55 Recommend he continue Lipitor, will add Zetia 10 mg daily  Essential hypertension Blood pressure is well controlled on today's visit. No changes made to the medications.     Signed, Dossie Arbour, M.D., Ph.D. [facility] [place], [place] [phone]

## 2023-06-13 ENCOUNTER — Ambulatory Visit: Payer: Medicare HMO | Attending: Cardiovascular Disease | Admitting: Cardiovascular Disease

## 2023-06-13 ENCOUNTER — Encounter: Payer: Self-pay | Admitting: Cardiovascular Disease

## 2023-06-13 VITALS — BP 134/74 | HR 68 | Ht 72.0 in | Wt 209.2 lb

## 2023-06-13 DIAGNOSIS — I25118 Atherosclerotic heart disease of native coronary artery with other forms of angina pectoris: Secondary | ICD-10-CM | POA: Diagnosis not present

## 2023-06-13 DIAGNOSIS — E782 Mixed hyperlipidemia: Secondary | ICD-10-CM

## 2023-06-13 DIAGNOSIS — E663 Overweight: Secondary | ICD-10-CM

## 2023-06-13 DIAGNOSIS — I10 Essential (primary) hypertension: Secondary | ICD-10-CM

## 2023-06-13 DIAGNOSIS — Z79899 Other long term (current) drug therapy: Secondary | ICD-10-CM

## 2023-06-13 DIAGNOSIS — I209 Angina pectoris, unspecified: Secondary | ICD-10-CM

## 2023-06-13 DIAGNOSIS — R072 Precordial pain: Secondary | ICD-10-CM

## 2023-06-13 MED ORDER — METOPROLOL TARTRATE 100 MG PO TABS: 100.0000 mg | ORAL_TABLET | Freq: Once | ORAL | 0 refills | Status: DC

## 2023-06-13 MED ORDER — EZETIMIBE 10 MG PO TABS: 10.0000 mg | ORAL_TABLET | Freq: Every day | ORAL | 3 refills | Status: DC

## 2023-06-13 NOTE — Patient Instructions (Addendum)
Medication Instructions:  Please start zetia 10 mg daily  If you need a refill on your cardiac medications before your next appointment, please call your pharmacy.   Lab work: Your provider would like for you to have following labs drawn today BMP.    Testing/Procedures:   Your cardiac CT will be scheduled at one of the below locations:   [facility] [address] [place], [place] [address] [phone]  OR   [facility] [address] [place], [place] [address] [phone]   If scheduled at [facility] or [facility], please arrive 15 mins early for check-in and test prep.  There is spacious parking and easy access to the radiology department from the [facility] entrance. Please enter here and check-in with the desk attendant.   Please follow these instructions carefully (unless otherwise directed):  An IV will be required for this test and Nitroglycerin will be given.  Hold all erectile dysfunction medications at least 3 days (72 hrs) prior to test. (Ie viagra, cialis, sildenafil, tadalafil, etc)   On the Night Before the Test: Be sure to Drink plenty of water. Do not consume any caffeinated/decaffeinated beverages or chocolate 12 hours prior to your test. Do not take any antihistamines 12 hours prior to your test.  On the Day of the Test: Drink plenty of water until 1 hour prior to the test. Do not eat any food 1 hour prior to test. You may take your regular medications prior to the test.  Take metoprolol (Lopressor) 100 MG two hours prior to test. If you take Furosemide/Hydrochlorothiazide/Spironolactone/Chlorthalidone, please HOLD on the morning of the test. Patients who wear a continuous glucose monitor MUST remove the device prior to scanning.         After the Test: Drink plenty of water. After receiving IV contrast, you may  experience a mild flushed feeling. This is normal. On occasion, you may experience a mild rash up to 24 hours after the test. This is not dangerous. If this occurs, you can take Benadryl 25 mg and increase your fluid intake. If you experience trouble breathing, this can be serious. If it is severe call [phone] IMMEDIATELY. If it is mild, please call our office.  We will call to schedule your test 2-4 weeks out understanding that some insurance companies will need an authorization prior to the service being performed.   For more information and frequently asked questions, please visit our website : [email or web address]  For non-scheduling related questions, please contact the cardiac imaging nurse navigator should you have any questions/concerns: Cardiac Imaging Nurse Navigators Direct Office Dial: [phone]   For scheduling needs, including cancellations and rescheduling, please call [place], [phone].   Follow-Up: At [facility], you and your health needs are our priority.  As part of our continuing mission to provide you with exceptional heart care, we have created designated Provider Care Teams.  These Care Teams include your primary Cardiologist (physician) and Advanced Practice Providers (APPs -  Physician Assistants and Nurse Practitioners) who all work together to provide you with the care you need, when you need it.  You will need a follow up appointment in 12 months  Providers on your designated Care Team:   Nicolasa Ducking, NP Eula Listen, PA-C Cadence Fransico [place], [place]  COVID-19 Vaccine Information can be found at: [email or web address] For questions related to vaccine distribution or appointments, please email [email or web address] or call [phone].

## 2023-06-14 LAB — BASIC METABOLIC PANEL
BUN/Creatinine Ratio: 10 (ref 10–24)
BUN: 10 mg/dL (ref 8–27)
CO2: 25 mmol/L (ref 20–29)
Calcium: 9.8 mg/dL (ref 8.6–10.2)
Chloride: 102 mmol/L (ref 96–106)
Creatinine, Ser: 0.96 mg/dL (ref 0.76–1.27)
Glucose: 85 mg/dL (ref 70–99)
Potassium: 4.7 mmol/L (ref 3.5–5.2)
Sodium: 140 mmol/L (ref 134–144)
eGFR: 84 mL/min/{1.73_m2} (ref >59)

## 2023-06-20 ENCOUNTER — Encounter: Payer: Self-pay | Admitting: Emergency Medicine

## 2023-06-22 ENCOUNTER — Telehealth (HOSPITAL_COMMUNITY): Payer: Self-pay | Admitting: *Deleted

## 2023-06-22 NOTE — Telephone Encounter (Signed)
Reaching out to patient to offer assistance regarding upcoming cardiac imaging study; pt verbalizes understanding of appt date/time, parking situation and where to check in, pre-test NPO status and medications ordered, and verified current allergies; name and call back number provided for further questions should they arise Hayley Sharpe RN Navigator Cardiac [facility] [phone] office [phone] cell  

## 2023-06-23 ENCOUNTER — Other Ambulatory Visit: Payer: Self-pay | Admitting: Cardiovascular Disease

## 2023-06-23 ENCOUNTER — Ambulatory Visit
Admission: RE | Admit: 2023-06-23 | Discharge: 2023-06-23 | Disposition: A | Discharge status: Home / Self Care | Payer: Medicare HMO | Source: Ambulatory Visit | Attending: Cardiovascular Disease | Admitting: Cardiovascular Disease

## 2023-06-23 DIAGNOSIS — R931 Abnormal findings on diagnostic imaging of heart and coronary circulation: Secondary | ICD-10-CM

## 2023-06-23 DIAGNOSIS — R072 Precordial pain: Secondary | ICD-10-CM | POA: Insufficient documentation

## 2023-06-23 DIAGNOSIS — I2510 Atherosclerotic heart disease of native coronary artery without angina pectoris: Secondary | ICD-10-CM | POA: Diagnosis not present

## 2023-06-23 MED ORDER — SODIUM CHLORIDE 0.9 % IV SOLN: INTRAVENOUS | Status: DC

## 2023-06-23 MED ORDER — NITROGLYCERIN 0.4 MG SL SUBL
0.8000 mg | SUBLINGUAL_TABLET | Freq: Once | SUBLINGUAL | Status: AC
  Administered 2023-06-23: 0.8 mg via SUBLINGUAL

## 2023-06-23 MED ORDER — IOHEXOL 350 MG/ML SOLN
75.0000 mL | Freq: Once | INTRAVENOUS | Status: AC | PRN
  Administered 2023-06-23: 75 mL via INTRAVENOUS

## 2023-06-23 NOTE — Progress Notes (Signed)
Patient tolerated procedure well. Ambulate w/o difficulty. Denies light headedness or being dizzy. Sitting up drinking water provided. Encouraged to drink extra water today and reasoning explained. Verbalized understanding. All questions answered. ABC intact. No further needs. Discharge from procedure area w/o issues.

## 2023-06-28 ENCOUNTER — Ambulatory Visit: Payer: Medicare HMO | Attending: Cardiovascular Disease | Admitting: Cardiovascular Disease

## 2023-06-28 ENCOUNTER — Encounter: Payer: Self-pay | Admitting: Cardiovascular Disease

## 2023-06-28 VITALS — BP 140/80 | HR 61 | Ht 72.0 in | Wt 210.0 lb

## 2023-06-28 DIAGNOSIS — I10 Essential (primary) hypertension: Secondary | ICD-10-CM | POA: Diagnosis not present

## 2023-06-28 DIAGNOSIS — Z79899 Other long term (current) drug therapy: Secondary | ICD-10-CM | POA: Diagnosis not present

## 2023-06-28 DIAGNOSIS — I25118 Atherosclerotic heart disease of native coronary artery with other forms of angina pectoris: Secondary | ICD-10-CM | POA: Diagnosis not present

## 2023-06-28 DIAGNOSIS — I209 Angina pectoris, unspecified: Secondary | ICD-10-CM

## 2023-06-28 MED ORDER — ISOSORBIDE MONONITRATE ER 30 MG PO TB24: 30.0000 mg | ORAL_TABLET | Freq: Every day | ORAL | 3 refills | Status: DC

## 2023-06-28 NOTE — Progress Notes (Signed)
Cardiology Office Note  Date:  06/28/2023   ID:  Murvin, Duggan 1951-03-28, MRN [id number]  PCP:  Erasmo Downer, MD   Chief Complaint  Patient presents with   Follow up CTA     Patient c/o chest tightness at times. Medications reviewed by the patient verbally.     HPI:  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  PSH:    Past Surgical History:  Procedure Laterality Date   APPENDECTOMY  1999   COLONOSCOPY WITH PROPOFOL N/A 04/08/2021   Procedure: COLONOSCOPY WITH PROPOFOL;  Surgeon: Toney Reil, MD;  Location: [facility] ENDOSCOPY;  Service: Gastroenterology;  Laterality: N/A;    Current Outpatient Medications  Medication Sig Dispense Refill   acetaminophen (TYLENOL) 500 MG tablet Take 500 mg by mouth 2 (two) times daily.     albuterol (VENTOLIN HFA) 108 (90 Base) MCG/ACT inhaler TAKE 2 PUFFS BY MOUTH EVERY 6 HOURS AS NEEDED FOR WHEEZE OR SHORTNESS OF BREATH 18  each 0   aspirin 81 MG tablet Take 81 mg by mouth daily.     atorvastatin (LIPITOR) 40 MG tablet Take 1 tablet by mouth daily.     ezetimibe (ZETIA) 10 MG tablet Take 1 tablet (10 mg total) by mouth daily. 90 tablet 3   FAMOTIDINE PO Take 20 mg by mouth 2 (two) times daily.     Glucosamine-Chondroitin (OSTEO BI-FLEX REGULAR STRENGTH PO) Take by mouth 2 (two) times daily.     ipratropium (ATROVENT) 0.03 % nasal spray Place 2 sprays into both nostrils every 12 (twelve) hours. 30 mL 12   loratadine (CLARITIN) 10 MG tablet Take 1 tablet (10 mg total) by mouth daily. 30 tablet 11   losartan (COZAAR) 50 MG tablet TAKE 1 TABLET BY MOUTH EVERY DAY 90 tablet 1   metoprolol tartrate (LOPRESSOR) 100 MG tablet Take 1 tablet (100 mg total) by mouth once for 1 dose. Take 2 hours prior to Cardiac CT 1 tablet 0   montelukast (SINGULAIR) 10 MG tablet TAKE 1 TABLET BY MOUTH EVERYDAY AT BEDTIME 90 tablet 1   No current facility-administered medications for this visit.     Allergies:   Patient has no known allergies.   Social History:  The patient  reports that he has never smoked. He has never used smokeless tobacco. He reports current alcohol use of about 6.0 standard drinks of alcohol per week. He reports that he does not use drugs.   Family History:   family history includes Cancer in his mother; Diabetes  in his brother, brother, sister, and sister; Heart disease in his brother; Heart disease (age of onset: 69) in his brother.    Review of Systems: Review of Systems  Constitutional: Negative.   HENT: Negative.    Respiratory: Negative.    Cardiovascular:  Positive for chest pain.  Gastrointestinal: Negative.   Musculoskeletal: Negative.   Neurological: Negative.   Psychiatric/Behavioral: Negative.    All other systems reviewed and are negative.   PHYSICAL EXAM: VS:  BP (!) 140/80 (BP Location: Left Arm, Patient Position: Sitting, Cuff Size: Normal)   Pulse 61   Ht 6' (1.829 m)   Wt 210 lb  (95.3 kg)   SpO2 96%   BMI 28.48 kg/m  , BMI Body mass index is 28.48 kg/m. Constitutional:  oriented to person, place, and time. No distress.  HENT:  Head: Grossly normal Eyes:  no discharge. No scleral icterus.  Neck: No JVD, no carotid bruits  Cardiovascular: Regular rate and rhythm, no murmurs appreciated Pulmonary/Chest: Clear to auscultation bilaterally, no wheezes or rails Abdominal: Soft.  no distension.  no tenderness.  Musculoskeletal: Normal range of motion Neurological:  normal muscle tone. Coordination normal. No atrophy Skin: Skin warm and dry Psychiatric: normal affect, pleasant  Recent Labs: 02/17/2023: ALT 20 06/13/2023: BUN 10; Creatinine, Ser 0.96; Potassium 4.7; Sodium 140    Lipid Panel Lab Results  Component Value Date   CHOL 128 02/17/2023   HDL 44 02/17/2023   LDLCALC 61 02/17/2023   TRIG 132 02/17/2023      Wt Readings from Last 3 Encounters:  06/28/23 210 lb (95.3 kg)  06/13/23 209 lb 4 oz (94.9 kg)  04/08/23 203 lb (92.1 kg)     ASSESSMENT AND PLAN:  Problem List Items Addressed This Visit       Cardiology Problems   Primary hypertension   Relevant Orders   EKG 12-Lead (Completed)   Atherosclerosis of coronary artery   Relevant Orders   EKG 12-Lead (Completed)   Other Visit Diagnoses       Angina pectoris (HCC)    -  Primary   Relevant Orders   EKG 12-Lead (Completed)      Chest pain/angina Significant coronary calcification noted in LAD in 2015 On last clinic visit reported having chest discomfort, left arm pain concerning for angina Cardiac CTA performed June 23, 2023 showing severe proximal to mid LAD disease, also ostial RCA disease Given severity of his disease we have recommended left heart catheterization On review of schedule, first available outpatient spot is January 3 I have reviewed the risks, indications, and alternatives to cardiac catheterization, possible angioplasty, and stenting with the patient. Risks  include but are not limited to bleeding, infection, vascular injury, stroke, myocardial infection, arrhythmia, kidney injury, radiation-related injury in the case of prolonged fluoroscopy use, emergency cardiac surgery, and death. The patient understands the risks of serious complication is 1-2 in 1000 with diagnostic cardiac cath and 1-2% or less with angioplasty/stenting.  He is willing to proceed All questions answered  Hyperlipidemia Continue Lipitor with Zetia daily Goal LDL less than 55  Essential hypertension Blood pressure is well controlled on today's visit. No changes made to the medications.    Signed, Dossie Arbour, M.D., Ph.D. [facility] [place], [place] [phone]

## 2023-06-28 NOTE — Patient Instructions (Addendum)
Medication Instructions:  Please start imdur 30 mg daily  If you need a refill on your cardiac medications before your next appointment, please call your pharmacy.   Lab work: Your provider would like for you to have following labs drawn today BMP and CBC.    Testing/Procedures:  [place] [facility] DEPT OF [facility] AT [place] [address] [place] [address] Dept: [phone] Loc: [phone]  JC GINDHART  06/28/2023  You are scheduled for a Cardiac Catheterization on Friday, January 3 with Dr. Cristal Deer End.  1. Please arrive at the [facility]ascular Center Entrance of [facility], [address], [place] [address] at 7:30 AM (This is 1 hour(s) prior to your procedure time).  Proceed to the Check-In Desk directly inside the entrance.  Procedure Parking: Use the entrance off of the [address] side of the hospital. Turn right upon entering and follow the driveway to parking that is directly in front of the Heart & Vascular Center. There is no valet parking available at this entrance, however there is an awning directly in front of the Heart & Vascular Center for drop off/ pick up for patients.  Special note: Every effort is made to have your procedure done on time. Please understand that emergencies sometimes delay scheduled procedures.  2. Diet: Do not eat solid foods after midnight.  The patient may have clear liquids until 5am upon the day of the procedure.  4. Medication instructions in preparation for your procedure:   Contrast Allergy: No  On the morning of your procedure, take your Aspirin 81 mg and any morning medicines NOT listed above.  You may use sips of water.  5. Plan to go home the same day, you will only stay overnight if medically necessary. 6. Bring a current list of your medications and current insurance cards. 7. You MUST have a responsible person to drive you home. 8.  Someone MUST be with you the first 24 hours after you arrive home or your discharge will be delayed. 9. Please wear clothes that are easy to get on and off and wear slip-on shoes.  Thank you for allowing [place] to care for you!   -- [place] [facility]   Follow-Up: At [facility], you and your health needs are our priority.  As part of our continuing mission to provide you with exceptional heart care, we have created designated Provider Care Teams.  These Care Teams include your primary Cardiologist (physician) and Advanced Practice Providers (APPs -  Physician Assistants and Nurse Practitioners) who all work together to provide you with the care you need, when you need it.  You will need a follow up appointment in 1 month  Providers on your designated Care Team:   Nicolasa Ducking, NP Eula Listen, PA-C Cadence Fransico Michael, [place]  COVID-19 Vaccine Information can be found at: [email or web address] For questions related to vaccine distribution or appointments, please email [email or web address] or call [phone].

## 2023-06-29 LAB — BASIC METABOLIC PANEL
BUN/Creatinine Ratio: 12 (ref 10–24)
BUN: 11 mg/dL (ref 8–27)
CO2: 21 mmol/L (ref 20–29)
Calcium: 9.5 mg/dL (ref 8.6–10.2)
Chloride: 103 mmol/L (ref 96–106)
Creatinine, Ser: 0.91 mg/dL (ref 0.76–1.27)
Glucose: 90 mg/dL (ref 70–99)
Potassium: 4.7 mmol/L (ref 3.5–5.2)
Sodium: 139 mmol/L (ref 134–144)
eGFR: 90 mL/min/{1.73_m2} (ref >59)

## 2023-06-29 LAB — CBC
Hematocrit: 42.3 % (ref 37.5–51.0)
Hemoglobin: 14.5 g/dL (ref 13.0–17.7)
MCH: 30.6 pg (ref 26.6–33.0)
MCHC: 34.3 g/dL (ref 31.5–35.7)
MCV: 89 fL (ref 79–97)
Platelets: 194 10*3/uL (ref 150–450)
RBC: 4.74 x10E6/uL (ref 4.14–5.80)
RDW: 12.3 % (ref 11.6–15.4)
WBC: 6 10*3/uL (ref 3.4–10.8)

## 2023-06-30 ENCOUNTER — Telehealth: Payer: Self-pay | Admitting: Cardiovascular Disease

## 2023-06-30 NOTE — Telephone Encounter (Signed)
Pt c/o medication issue:  1. Name of Medication:   isosorbide mononitrate (IMDUR) 30 MG 24 hr tablet   2. How are you currently taking this medication (dosage and times per day)?   3. Are you having a reaction (difficulty breathing--STAT)?   4. What is your medication issue?   Patient called to get clarification on how he should be taking this medication.  Patient wants to know if he should take his medication prior to his procedure on 1/3.

## 2023-06-30 NOTE — Telephone Encounter (Signed)
Spoke to patient and instructed him to look over his after visit summary with the medication instructions that stated the following:  4. Medication instructions in preparation for your procedure:    Contrast Allergy: No   On the morning of your procedure, take your Aspirin 81 mg and any morning medicines NOT listed above.  You may use sips of water.   Patient understood with read back

## 2023-07-04 ENCOUNTER — Other Ambulatory Visit: Payer: Self-pay | Admitting: Cardiovascular Disease

## 2023-07-04 DIAGNOSIS — I25118 Atherosclerotic heart disease of native coronary artery with other forms of angina pectoris: Secondary | ICD-10-CM

## 2023-07-07 ENCOUNTER — Telehealth: Payer: Self-pay | Admitting: Internal Medicine

## 2023-07-07 ENCOUNTER — Ambulatory Visit: Payer: Medicare HMO | Admitting: Cardiology

## 2023-07-07 NOTE — Telephone Encounter (Signed)
 Patient would like to discuss medication instructions for 1/03 procedure with Dr. Okey Dupre.

## 2023-07-07 NOTE — Telephone Encounter (Signed)
 Spoke with patient and reviewed all instructions for his procedure tomorrow along with his medications. He read back all information.   Patient states that the new medication isosorbide  mononitrate is causing him terrible headaches. He would like to know what he should do because he is not tolerating it.

## 2023-07-08 ENCOUNTER — Encounter: Payer: Self-pay | Admitting: Internal Medicine

## 2023-07-08 ENCOUNTER — Encounter
Admission: RE | Disposition: A | Discharge status: Home / Self Care | Payer: Self-pay | Source: Home / Self Care | Attending: Internal Medicine

## 2023-07-08 ENCOUNTER — Ambulatory Visit
Admission: RE | Admit: 2023-07-08 | Discharge: 2023-07-08 | Disposition: A | Discharge status: Home / Self Care | Payer: HMO | Attending: Internal Medicine | Admitting: Internal Medicine

## 2023-07-08 ENCOUNTER — Other Ambulatory Visit: Payer: Self-pay

## 2023-07-08 DIAGNOSIS — Z79899 Other long term (current) drug therapy: Secondary | ICD-10-CM | POA: Insufficient documentation

## 2023-07-08 DIAGNOSIS — Z8249 Family history of ischemic heart disease and other diseases of the circulatory system: Secondary | ICD-10-CM | POA: Diagnosis not present

## 2023-07-08 DIAGNOSIS — J45909 Unspecified asthma, uncomplicated: Secondary | ICD-10-CM | POA: Insufficient documentation

## 2023-07-08 DIAGNOSIS — I25119 Atherosclerotic heart disease of native coronary artery with unspecified angina pectoris: Secondary | ICD-10-CM | POA: Diagnosis present

## 2023-07-08 DIAGNOSIS — I25118 Atherosclerotic heart disease of native coronary artery with other forms of angina pectoris: Secondary | ICD-10-CM

## 2023-07-08 DIAGNOSIS — I2584 Coronary atherosclerosis due to calcified coronary lesion: Secondary | ICD-10-CM | POA: Insufficient documentation

## 2023-07-08 DIAGNOSIS — I10 Essential (primary) hypertension: Secondary | ICD-10-CM | POA: Diagnosis not present

## 2023-07-08 DIAGNOSIS — E785 Hyperlipidemia, unspecified: Secondary | ICD-10-CM | POA: Diagnosis not present

## 2023-07-08 DIAGNOSIS — I209 Angina pectoris, unspecified: Secondary | ICD-10-CM

## 2023-07-08 HISTORY — PX: LEFT HEART CATH AND CORONARY ANGIOGRAPHY: CATH118249

## 2023-07-08 HISTORY — DX: Angina pectoris, unspecified: I20.9

## 2023-07-08 SURGERY — LEFT HEART CATH AND CORONARY ANGIOGRAPHY
Anesthesia: Moderate Sedation

## 2023-07-08 SURGERY — LEFT HEART CATH AND CORONARY ANGIOGRAPHY
Anesthesia: Moderate Sedation | Laterality: Left

## 2023-07-08 MED ORDER — MIDAZOLAM HCL 2 MG/2ML IJ SOLN
INTRAMUSCULAR | Status: AC
  Filled 2023-07-08: qty 2

## 2023-07-08 MED ORDER — ACETAMINOPHEN 325 MG PO TABS: 650.0000 mg | ORAL_TABLET | ORAL | Status: DC | PRN

## 2023-07-08 MED ORDER — ONDANSETRON HCL 4 MG/2ML IJ SOLN: 4.0000 mg | Freq: Four times a day (QID) | INTRAMUSCULAR | Status: DC | PRN

## 2023-07-08 MED ORDER — FENTANYL CITRATE (PF) 100 MCG/2ML IJ SOLN
INTRAMUSCULAR | Status: AC
  Filled 2023-07-08: qty 2

## 2023-07-08 MED ORDER — HEPARIN SODIUM (PORCINE) 1000 UNIT/ML IJ SOLN
INTRAMUSCULAR | Status: DC | PRN
  Administered 2023-07-08: 4500 [IU] via INTRAVENOUS

## 2023-07-08 MED ORDER — SODIUM CHLORIDE 0.9 % WEIGHT BASED INFUSION: 1.0000 mL/kg/h | INTRAVENOUS | Status: DC

## 2023-07-08 MED ORDER — SODIUM CHLORIDE 0.9% FLUSH: 3.0000 mL | INTRAVENOUS | Status: DC | PRN

## 2023-07-08 MED ORDER — SODIUM CHLORIDE 0.9 % WEIGHT BASED INFUSION
3.0000 mL/kg/h | INTRAVENOUS | Status: AC
  Administered 2023-07-08: 3 mL/kg/h via INTRAVENOUS

## 2023-07-08 MED ORDER — FENTANYL CITRATE (PF) 100 MCG/2ML IJ SOLN
INTRAMUSCULAR | Status: DC | PRN
  Administered 2023-07-08: 25 ug via INTRAVENOUS

## 2023-07-08 MED ORDER — HEPARIN (PORCINE) IN NACL 1000-0.9 UT/500ML-% IV SOLN
INTRAVENOUS | Status: AC
  Filled 2023-07-08: qty 1000

## 2023-07-08 MED ORDER — SODIUM CHLORIDE 0.9 % IV SOLN: 250.0000 mL | INTRAVENOUS | Status: DC | PRN

## 2023-07-08 MED ORDER — LIDOCAINE HCL (PF) 1 % IJ SOLN
INTRAMUSCULAR | Status: DC | PRN
  Administered 2023-07-08: 2 mL

## 2023-07-08 MED ORDER — SODIUM CHLORIDE 0.9% FLUSH: 3.0000 mL | Freq: Two times a day (BID) | INTRAVENOUS | Status: DC

## 2023-07-08 MED ORDER — HYDRALAZINE HCL 20 MG/ML IJ SOLN: 10.0000 mg | INTRAMUSCULAR | Status: DC | PRN

## 2023-07-08 MED ORDER — AMLODIPINE BESYLATE 2.5 MG PO TABS: 2.5000 mg | ORAL_TABLET | Freq: Every day | ORAL | 11 refills | Status: AC

## 2023-07-08 MED ORDER — HEPARIN SODIUM (PORCINE) 1000 UNIT/ML IJ SOLN
INTRAMUSCULAR | Status: AC
  Filled 2023-07-08: qty 10

## 2023-07-08 MED ORDER — VERAPAMIL HCL 2.5 MG/ML IV SOLN
INTRAVENOUS | Status: DC | PRN
  Administered 2023-07-08: 2.5 mg via INTRA_ARTERIAL

## 2023-07-08 MED ORDER — SODIUM CHLORIDE 0.9 % IV SOLN: INTRAVENOUS | Status: DC

## 2023-07-08 MED ORDER — IOHEXOL 300 MG/ML  SOLN
INTRAMUSCULAR | Status: DC | PRN
  Administered 2023-07-08: 57 mL

## 2023-07-08 MED ORDER — VERAPAMIL HCL 2.5 MG/ML IV SOLN
INTRAVENOUS | Status: AC
  Filled 2023-07-08: qty 2

## 2023-07-08 MED ORDER — ASPIRIN 81 MG PO CHEW: 81.0000 mg | CHEWABLE_TABLET | ORAL | Status: DC

## 2023-07-08 MED ORDER — LIDOCAINE HCL 1 % IJ SOLN
INTRAMUSCULAR | Status: AC
  Filled 2023-07-08: qty 20

## 2023-07-08 MED ORDER — HEPARIN (PORCINE) IN NACL 2000-0.9 UNIT/L-% IV SOLN
INTRAVENOUS | Status: DC | PRN
  Administered 2023-07-08: 1000 mL

## 2023-07-08 MED ORDER — MIDAZOLAM HCL 2 MG/2ML IJ SOLN
INTRAMUSCULAR | Status: DC | PRN
  Administered 2023-07-08: 1 mg via INTRAVENOUS

## 2023-07-08 SURGICAL SUPPLY — 11 items
CATH 5F 110X4 TIG (CATHETERS) IMPLANT
CATH INFINITI 5FR ANG PIGTAIL (CATHETERS) IMPLANT
DEVICE RAD TR BAND REGULAR (VASCULAR PRODUCTS) IMPLANT
DRAPE BRACHIAL (DRAPES) IMPLANT
GLIDESHEATH SLEND SS 6F .021 (SHEATH) IMPLANT
GUIDEWIRE INQWIRE 1.5J.035X260 (WIRE) IMPLANT
INQWIRE 1.5J .035X260CM (WIRE) ×1
PACK CARDIAC CATH (CUSTOM PROCEDURE TRAY) ×1 IMPLANT
PROTECTION STATION PRESSURIZED (MISCELLANEOUS) ×1
SET ATX-X65L (MISCELLANEOUS) IMPLANT
STATION PROTECTION PRESSURIZED (MISCELLANEOUS) IMPLANT

## 2023-07-08 NOTE — Interval H&P Note (Signed)
 History and Physical Interval Note:  07/08/2023 8:26 AM  Ryan English  has presented today for surgery, with the diagnosis of angina pectoris  Coronary artery disease.  The various methods of treatment have been discussed with the patient and family. After consideration of risks, benefits and other options for treatment, the patient has consented to  Procedure(s): LEFT HEART CATH AND CORONARY ANGIOGRAPHY (Left) as a surgical intervention.  The patient's history has been reviewed, patient examined, no change in status, stable for surgery.  I have reviewed the patient's chart and labs.  Questions were answered to the patient's satisfaction.    Cath Lab Visit (complete for each Cath Lab visit)  Clinical Evaluation Leading to the Procedure:   ACS: No.  Non-ACS:    Anginal Classification: CCS IV  Anti-ischemic medical therapy: Minimal Therapy (1 class of medications)  Non-Invasive Test Results: Coronary CTA with positive CT-FFR in proximal/mid LAD ->intermediate to high risk  Prior CABG: No previous CABG  Ryan English

## 2023-07-12 ENCOUNTER — Telehealth: Payer: Self-pay

## 2023-07-12 DIAGNOSIS — I25118 Atherosclerotic heart disease of native coronary artery with other forms of angina pectoris: Secondary | ICD-10-CM

## 2023-07-12 DIAGNOSIS — I209 Angina pectoris, unspecified: Secondary | ICD-10-CM

## 2023-07-12 NOTE — Telephone Encounter (Signed)
 Copied from CRM [id number]. Topic: Referral - Request for Referral >> Jul 12, 2023 10:51 AM [name] wrote: Did the patient discuss referral with their provider in the last year? No (If No - schedule appointment) (If Yes - send message)  Appointment offered? No  Type of order/referral and detailed reason for visit: Cardiology   Preference of office, provider, location: Dr. Ozell Cool or Dr. Velma Glendia Bevels [facility] Fax [phone]  Phone [phone]  If referral order, have you been seen by this specialty before? Yes (If Yes, this issue or another issue? When? Where?  Can we respond through MyChart? No

## 2023-07-14 NOTE — Telephone Encounter (Signed)
 Ok to send referral as requested - use angina and atherosclerosis from problem list to associate diagnoses.

## 2023-07-14 NOTE — Telephone Encounter (Signed)
 Pt called in wanting to know when referral will be completed to schedule appt. Please cb

## 2023-07-25 ENCOUNTER — Telehealth (INDEPENDENT_AMBULATORY_CARE_PROVIDER_SITE_OTHER): Payer: HMO | Admitting: Family Medicine

## 2023-07-25 ENCOUNTER — Encounter: Payer: Self-pay | Admitting: Family Medicine

## 2023-07-25 DIAGNOSIS — J069 Acute upper respiratory infection, unspecified: Secondary | ICD-10-CM | POA: Diagnosis not present

## 2023-07-25 LAB — POC COVID19 BINAXNOW: SARS Coronavirus 2 Ag: NEGATIVE

## 2023-07-25 NOTE — Addendum Note (Signed)
Addended by: Jacquenette Shone on: 07/25/2023 03:56 PM   Modules accepted: Orders

## 2023-07-25 NOTE — Progress Notes (Unsigned)
Cardiology Office Note:  .   Date:  07/26/2023  ID:  Ryan English, DOB Oct 17, 1950, MRN [id number] PCP: Erasmo Downer, MD  [facility] Providers Cardiologist:  None    History of Present Illness: .   Ryan English is a 73 y.o. male with a past medical history of arthritis, GERD, hyperlipidemia, bradycardia, primary hypertension, atherosclerosis of the coronary artery, who is here today for follow-up.   Coronary calcification on CT scan of the chest in 2015.  Had had episodes of angina, coronary calcification history, and history of heart murmur.  Underwent coronary CTA continue to have episodes of tightness in his chest with radiation into the left arm.  Denies significant reproducible chest or arm pain on exertion or shortness of breath.  Continues to remain active playing golf and yard work.  Works at [employer].  Last coronary CTA was June 23, 2023 showing severe proximal to mid LAD disease and significant ostial RCA disease.  With his symptoms and abnormal CTA results he was scheduled for left heart catheterization.  Left heart catheterization was completed on 07/08/2023 which revealed moderate to severe single-vessel coronary disease with sequential 50% ostial proximal LAD stenosis as well as a tandem 70-80% mid LAD lesions.  The proximal/mid LAD demonstrates moderate to severe calcifications.  LMCA, left circumflex, and RCA have mild to moderate nonobstructive disease.  Normal left ventricular systolic function (55 to 65%) and LVEDP of 7 mmHg.  Recommendation was to discontinue isosorbide mononitrate and initiate amlodipine 2.5 mg daily for antianginal therapy given relief of chest pain but significant headaches associated with isosorbide.  If he has any recurrent angina refractory to medical therapy PCI to the proximal/mid LAD will need to be considered and would ideally be done at [facility] due to moderate-severe LAD calcification that may necessitate atherectomy  and/or lithotripsy.  Continue with aggressive secondary prevention of coronary artery disease.   He returns to clinic today suffering from a head cold and upper respiratory infection.  He states he continues to have chest discomfort that radiates into the left arm.  States that he has discomfort when he is sitting not necessarily on exertion.  Denies any significant reproducible pain.  Denies any shortness of breath and dyspnea on exertion.  States he had the same symptoms that he had prior to his left heart catheterization that was recently completed.  States that he has been compliant with his medication regimen as his isosorbide was recently discontinued and he was started on amlodipine for antianginal effect.  Has had no adverse side effects from his medications.  ROS: 10 point review of systems has been reviewed and considered negative with exception of what is been listed in the HPI  Studies Reviewed: Marland Kitchen   EKG Interpretation Date/Time:  Tuesday July 26 2023 10:59:32 EST Ventricular Rate:  71 PR Interval:  172 QRS Duration:  78 QT Interval:  380 QTC Calculation: 412 R Axis:   -13  Text Interpretation: Normal sinus rhythm Minimal voltage criteria for LVH, may be normal variant ( R in aVL ) When compared with ECG of 28-Jun-2023 10:46, No significant change was found Confirmed by Charlsie Quest ([id number]) on 07/26/2023 11:08:57 AM    [facility] 07/08/2023 Conclusions: Moderate to severe single-vessel coronary artery disease with sequential 50% ostial and proximal LAD stenoses as well as tandem 70-80% mid LAD lesions.  The proximal/mid LAD demonstrates moderate to severe calcification.  LMCA, LCx, and RCA have mild to moderate, non-obstructive disease. Normal left  ventricular systolic function and filling pressure (LVEF 55-65%, LVEDP 7 mmHg).   Recommendations: Discontinue isosorbide mononitrate and initiate amlodipine 2.5 mg daily for antianginal therapy, given relief of chest pain but significant  headaches associated with isosorbide mononitrate. If the patient has recurrent angina refractory to medical therapy, PCI to the proximal/mid LAD will need to be considered (would ideally be done at [facility] due to moderate-severe LAD calcification that may necessitate atherectomy and/or lithotripsy). Continue aggressive secondary prevention of coronary artery disease.   Risk Assessment/Calculations:             Physical Exam:   VS:  BP 122/76   Pulse 71   Ht 6' (1.829 m)   Wt 208 lb 3.2 oz (94.4 kg)   SpO2 95%   BMI 28.24 kg/m    Wt Readings from Last 3 Encounters:  07/26/23 208 lb 3.2 oz (94.4 kg)  07/08/23 205 lb (93 kg)  06/28/23 210 lb (95.3 kg)    GEN: Well nourished, well developed in no acute distress NECK: No JVD; No carotid bruits CARDIAC: RRR, no murmurs, rubs, gallops, right radial 2+ pulse from prior cath without any bruising or pain at site. RESPIRATORY:  Clear to auscultation without rales, wheezing or rhonchi  ABDOMEN: Soft, non-tender, non-distended EXTREMITIES:  No edema; No deformity   ASSESSMENT AND PLAN: .   Coronary artery disease of native coronary artery with stable angina.  Patient states that his pain has returned since having his recent left heart catheterization which revealed few moderate to severe single-vessel coronary artery disease with sequential 50% ostial proximal LAD stenosis as well as tandem 70-80% mid LAD lesion was.  He was recommended to discontinue isosorbide and initiate amlodipine given relief of his chest pain and significant headaches associated with isosorbide.  If he continues to have recurrent angina refractory to medical therapy PCI to the proximal/mid LAD will need to be considered but need to be considered at [facility] as he may require arthrectomy and lithotripsy.  With his continued chest discomfort we did discuss repeat left heart catheterization at [facility] with possible arthrectomy and lithotripsy with review of schematics or  during his visit.  Patient did not come alone to his visit today and would like to discuss the repeat procedure at [facility] with his wife before he makes any decisions.  He is continued on aspirin 81 mg daily, atorvastatin 40 mg daily, and ezetimibe 10 mg daily.  Hyperlipidemia with a goal of less than 55 he is continued on atorvastatin 40 mg daily and ezetimibe 10 mg daily with his last LDL of 61 on 02/17/2023.  Will need updated lipid panel.  May require PCSK9 inhibitor therapy to get to goal.  Hypertension with a blood pressure today of 122/76.  Blood pressures remain stable.  He is continued on losartan 50 mg daily and amlodipine 2.5 mg daily.  Encouraged to continue to monitor pressure 1 to 2 hours postmedication administration.       Dispo: Patient return to clinic with MD/APP 2 to 3 weeks status postprocedure if he decides to undergo repeat left heart catheterization or in 3 months to reevaluate symptoms if he is still apprehensive about repeating the procedure  Signed, Kaius Daino, NP

## 2023-07-25 NOTE — Progress Notes (Signed)
MyChart Video Visit    Virtual Visit via Video Note   This format is felt to be most appropriate for this patient at this time. Physical exam was limited by quality of the video and audio technology used for the visit.   Patient location: At work, in [employer] location: [facility]  I discussed the limitations of evaluation and management by telemedicine and the availability of in person appointments. The patient expressed understanding and agreed to proceed.  [name]   DOB: 31-Aug-1950   73 y.o. Male  MRN: [id number] Visit Date: 07/25/2023  Today's healthcare provider: Sherlyn Hay, DO   No chief complaint on file.  Subjective    HPI  The patient, with a history of hypertension and angina pectoris, presents with symptoms of upper respiratory tract infection that began on Saturday evening. He reports a significant amount of drainage, a scratchy throat, and productive cough. The cough is not severe but is associated with expectoration of a small amount of mucus. He does report feeling tired, which he attributes to poor sleep due to his symptoms.  He denies changes in his sense of smell or taste.   The patient has been self-managing his symptoms with over-the-counter medications, including Mucinex D (Guaifenesin and pseudoephedrine) and Delsym (Dextromethorphan) cough medicine. He has not used his inhaler or nasal spray. He expresses a desire to get ahead of his symptoms before they worsen, as he has experienced in the past. He is currently at work and has not exerted himself significantly since the onset of symptoms.  The patient also mentions that he has new supplemental insurance information to provide.    Medications: Outpatient Medications Prior to Visit  Medication Sig   acetaminophen (TYLENOL) 500 MG tablet Take 1,000 mg by mouth 2 (two) times daily.   albuterol (VENTOLIN HFA) 108 (90 Base) MCG/ACT inhaler TAKE 2 PUFFS BY  MOUTH EVERY 6 HOURS AS NEEDED FOR WHEEZE OR SHORTNESS OF BREATH   amLODipine (NORVASC) 2.5 MG tablet Take 1 tablet (2.5 mg total) by mouth daily.   aspirin 81 MG tablet Take 81 mg by mouth daily.   atorvastatin (LIPITOR) 40 MG tablet Take 40 mg by mouth daily.   ezetimibe (ZETIA) 10 MG tablet Take 1 tablet (10 mg total) by mouth daily.   famotidine (PEPCID) 20 MG tablet Take 20 mg by mouth 2 (two) times daily. Maximum strenght   ipratropium (ATROVENT) 0.03 % nasal spray Place 2 sprays into both nostrils every 12 (twelve) hours. (Patient taking differently: Place 2 sprays into both nostrils daily as needed (Allergies).)   loratadine (CLARITIN) 10 MG tablet Take 1 tablet (10 mg total) by mouth daily.   losartan (COZAAR) 50 MG tablet TAKE 1 TABLET BY MOUTH EVERY DAY   Misc Natural Products (OSTEO BI-FLEX TRIPLE STRENGTH) TABS Take 2 tablets by mouth daily.   montelukast (SINGULAIR) 10 MG tablet TAKE 1 TABLET BY MOUTH EVERYDAY AT BEDTIME   No facility-administered medications prior to visit.    Review of Systems  Constitutional:  Negative for chills, fatigue and fever.  HENT:  Positive for congestion, rhinorrhea and sore throat. Negative for ear pain, sinus pressure, sinus pain and sneezing.   Respiratory:  Positive for cough (productive). Negative for shortness of breath and wheezing.   Cardiovascular:  Negative for chest pain, palpitations and leg swelling.  Gastrointestinal:  Negative for diarrhea, nausea and vomiting.  Neurological:  Positive for headaches (small). Negative for weakness.  Objective    There were no vitals taken for this visit.     Physical Exam Constitutional:      General: He is not in acute distress.    Appearance: Normal appearance. He is not diaphoretic.  HENT:     Head: Normocephalic.  Eyes:     Conjunctiva/sclera: Conjunctivae normal.  Pulmonary:     Effort: Pulmonary effort is normal. No respiratory distress.  Neurological:     Mental Status:  He is alert and oriented to person, place, and time. Mental status is at baseline.       Assessment & Plan    Upper respiratory tract infection, unspecified type  Presents with symptoms of URI, including drainage, cough, and scratchy throat since Saturday evening. No fever, ear pain, significant sinus pressure, nausea, vomiting, or diarrhea. Using Mucinex D and Delsym for symptom relief. Productive but not severe cough. Suspected viral etiology. Advised against early antibiotic use due to likely viral nature and potential for self-resolution. Discussed risks of unnecessary antibiotic use, including resistance and side effects. Informed that if symptoms do not improve by Thursday, antibiotics may be considered. - Continue supportive care with hydration and rest - Practice deep breathing exercises (10 slow, deep breaths per hour while awake) - Use Delsym for cough management - Use inhaler as needed for respiratory symptoms - Use nasal spray for nasal congestion - Consider Tessalon Perles if cough worsens - Call the office if symptoms do not improve by Thursday for potential antibiotic treatment - Obtain a COVID-19 test at the office around 3:30 PM - Consider Paxlovid if the test is positive  Follow-up - Call the office on Thursday if symptoms are not improving - Bring new insurance information to the office visit.  Return if symptoms worsen or fail to improve.     I discussed the assessment and treatment plan with the patient. The patient was provided an opportunity to ask questions and all were answered. The patient agreed with the plan and demonstrated an understanding of the instructions.   The patient was advised to call back or seek an in-person evaluation if the symptoms worsen or if the condition fails to improve as anticipated.  I provided 11 minutes of virtual-face-to-face time during this encounter.   Sherlyn Hay, DO [facility] [phone]  (phone) [phone] (fax)  [facility]

## 2023-07-26 ENCOUNTER — Ambulatory Visit: Payer: HMO | Attending: Cardiology | Admitting: Cardiology

## 2023-07-26 ENCOUNTER — Encounter: Payer: Self-pay | Admitting: Family Medicine

## 2023-07-26 ENCOUNTER — Encounter: Payer: Self-pay | Admitting: Cardiology

## 2023-07-26 VITALS — BP 122/76 | HR 71 | Ht 72.0 in | Wt 208.2 lb

## 2023-07-26 DIAGNOSIS — E782 Mixed hyperlipidemia: Secondary | ICD-10-CM | POA: Diagnosis not present

## 2023-07-26 DIAGNOSIS — I25118 Atherosclerotic heart disease of native coronary artery with other forms of angina pectoris: Secondary | ICD-10-CM | POA: Diagnosis not present

## 2023-07-26 DIAGNOSIS — I10 Essential (primary) hypertension: Secondary | ICD-10-CM | POA: Diagnosis not present

## 2023-07-26 DIAGNOSIS — J189 Pneumonia, unspecified organism: Secondary | ICD-10-CM

## 2023-07-26 NOTE — Patient Instructions (Signed)
Medication Instructions:  - Your physician recommends that you continue on your current medications as directed. Please refer to the Current Medication list given to you today.  *If you need a refill on your cardiac medications before your next appointment, please call your pharmacy*   Lab Work: - none ordered  If you have labs (blood work) drawn today and your tests are completely normal, you will receive your results only by: MyChart Message (if you have MyChart) OR A paper copy in the mail If you have any lab test that is abnormal or we need to change your treatment, we will call you to review the results.   Testing/Procedures: Call back to the office [phone], or send a MyChart message with multiple, possible dates that you could have a heart cath done at [facility].    Follow-Up: At [facility], you and your health needs are our priority.  As part of our continuing mission to provide you with exceptional heart care, we have created designated Provider Care Teams.  These Care Teams include your primary Cardiologist (physician) and Advanced Practice Providers (APPs -  Physician Assistants and Nurse Practitioners) who all work together to provide you with the care you need, when you need it.  We recommend signing up for the patient portal called "MyChart".  Sign up information is provided on this After Visit Summary.  MyChart is used to connect with patients for Virtual Visits (Telemedicine).  Patients are able to view lab/test results, encounter notes, upcoming appointments, etc.  Non-urgent messages can be sent to your provider as well.   To learn more about what you can do with MyChart, go to [email or web address].    Your next appointment:   6 week(s)  Provider:   You may see Julien Nordmann, MD or one of the following Advanced Practice Providers on your designated Care Team:    Charlsie Quest, NP   Other Instructions N/a

## 2023-07-27 ENCOUNTER — Ambulatory Visit: Payer: Self-pay

## 2023-07-27 MED ORDER — AMOXICILLIN 500 MG PO CAPS: 1000.0000 mg | ORAL_CAPSULE | Freq: Three times a day (TID) | ORAL | 0 refills | Status: AC

## 2023-07-27 MED ORDER — AZITHROMYCIN 250 MG PO TABS: ORAL_TABLET | ORAL | 0 refills | Status: DC

## 2023-07-27 NOTE — Telephone Encounter (Signed)
Message from [place] sent at 07/27/2023  9:03 AM EST  Summary: body ache /chills   Caller states he's experiencing body ache, cough and chills but feels warm at night. Patient had a COVID test in office and it was negative. Caller requesting a Rx.         Chief Complaint: severe coughing spells, unable to rest Symptoms: muscle aches, subjective fever at night, mentioned 99.5 temp, fatigue, SOB with exertion, mild runny nose Frequency: SAt Pertinent Negatives: Patient denies wheezing, chest pain,  Disposition: [] ED /[] Urgent Care (no appt availability in office) / [] Appointment(In office/virtual)/ []  [facility]/ [] Home Care/ [] Refused Recommended Disposition /[] [facility]/ [x]  Follow-up with PCP Additional Notes: please refer back to Monday's note that is pt calls back by Thursday will prescribe abx. Please advise pt.  Reason for Disposition  SEVERE coughing spells (e.g., whooping sound after coughing, vomiting after coughing)  Answer Assessment - Initial Assessment Questions 1. ONSET: "When did the cough begin?"      Sat night 2. SEVERITY: "How bad is the cough today?"      Coughing at night, coughing spells 3. SPUTUM: "Describe the color of your sputum" (none, dry cough; clear, white, yellow, green)     Dry cough  4. HEMOPTYSIS: "Are you coughing up any blood?" If so ask: "How much?" (flecks, streaks, tablespoons, etc.)     N/a 5. DIFFICULTY BREATHING: "Are you having difficulty breathing?" If Yes, ask: "How bad is it?" (e.g., mild, moderate, severe)    - MILD: No SOB at rest, mild SOB with walking, speaks normally in sentences, can lie down, no retractions, pulse < 100.    - MODERATE: SOB at rest, SOB with minimal exertion and prefers to sit, cannot lie down flat, speaks in phrases, mild retractions, audible wheezing, pulse 100-120.    - SEVERE: Very SOB at rest, speaks in single words, struggling to breathe, sitting hunched forward, retractions, pulse > 120       No SOB 6. FEVER: "Do you have a fever?" If Yes, ask: "What is your temperature, how was it measured, and when did it start?"    99.5 7. CARDIAC HISTORY: "Do you have any history of heart disease?" (e.g., heart attack, congestive heart failure)      Recent heart cath 8. LUNG HISTORY: "Do you have any history of lung disease?"  (e.g., pulmonary embolus, asthma, emphysema)     no 10. OTHER SYMPTOMS: "Do you have any other symptoms?" (e.g., runny nose, wheezing, chest pain)       Nasal drainage, achy body, fatigue  Protocols used: Cough - Acute Non-Productive-A-AH

## 2023-07-27 NOTE — Telephone Encounter (Signed)
Caller states he's experiencing body ache, cough and chills but feels warm at night. Patient had a COVID test in office and it was negative. Caller requesting a Rx.   Left message to call back about symptoms.

## 2023-07-28 NOTE — Telephone Encounter (Signed)
I think you handled this with the mychart message yesterday, but wanted to close the loop.

## 2023-08-12 DIAGNOSIS — E782 Mixed hyperlipidemia: Secondary | ICD-10-CM | POA: Diagnosis not present

## 2023-08-12 DIAGNOSIS — I25119 Atherosclerotic heart disease of native coronary artery with unspecified angina pectoris: Secondary | ICD-10-CM | POA: Diagnosis not present

## 2023-08-12 DIAGNOSIS — E785 Hyperlipidemia, unspecified: Secondary | ICD-10-CM | POA: Diagnosis not present

## 2023-08-12 DIAGNOSIS — I209 Angina pectoris, unspecified: Secondary | ICD-10-CM | POA: Diagnosis not present

## 2023-08-12 DIAGNOSIS — I25118 Atherosclerotic heart disease of native coronary artery with other forms of angina pectoris: Secondary | ICD-10-CM | POA: Diagnosis not present

## 2023-08-18 ENCOUNTER — Ambulatory Visit: Payer: HMO | Admitting: Family Medicine

## 2023-08-18 ENCOUNTER — Encounter: Payer: Self-pay | Admitting: Family Medicine

## 2023-08-18 VITALS — BP 113/81 | HR 82 | Ht 71.0 in | Wt 208.7 lb

## 2023-08-18 DIAGNOSIS — I25118 Atherosclerotic heart disease of native coronary artery with other forms of angina pectoris: Secondary | ICD-10-CM

## 2023-08-18 DIAGNOSIS — E782 Mixed hyperlipidemia: Secondary | ICD-10-CM

## 2023-08-18 DIAGNOSIS — I10 Essential (primary) hypertension: Secondary | ICD-10-CM

## 2023-08-18 NOTE — Assessment & Plan Note (Signed)
Cholesterol levels are well-controlled with current medication regimen. Recent LDL was 55 mg/dL, below the target of 70 mg/dL.   - Continue current medications: Lipitor 40 mg daily, Zetia 10 mg daily

## 2023-08-18 NOTE — Assessment & Plan Note (Signed)
CAD with four blockages (two at 50% and two at 70%). Initial stent placement unsuccessful due to blockage length. Currently on optimal medical therapy. Symptoms include intermittent chest tightness and arm pressure. Family history of heart disease. Scheduled for a virtual visit with an interventional cardiologist at [facility] on August 30, 2023, and a follow-up with a cardiologist in [place] on August 26, 2023. Discussed potential procedures including lithotripsy and risks of embolism from dislodging plaque. Emphasized the importance of a well-thought-out plan.   - Continue current medications: amlodipine 2.5 mg daily, baby aspirin, Lipitor 40 mg daily, Zetia 10 mg daily, losartan 50 mg daily   - Attend virtual visit with interventional cardiologist at [facility] on August 30, 2023   - Follow up with cardiologist in [place] on August 26, 2023   - Monitor for symptoms of crushing chest pain and go to ER if he occurs   - Use nitroglycerin as needed for chest pain

## 2023-08-18 NOTE — Progress Notes (Signed)
Established patient visit   [name]   DOB: July 07, 1950   73 y.o. Male  MRN: [id number] Visit Date: 08/18/2023  Today's healthcare provider: Shirlee Latch, MD   Chief Complaint  Patient presents with   Medical Management of Chronic Issues    6 month follow-up   Hypertension    Patient reports monitoring at home. No symptoms to report and taking medications as prescribed.  127/83 the night before last    Hyperlipidemia   Subjective    Hypertension  Hyperlipidemia   HPI     Medical Management of Chronic Issues    Additional comments: 6 month follow-up        Hypertension    Additional comments: Patient reports monitoring at home. No symptoms to report and taking medications as prescribed.  127/83 the night before last       Last edited by Acey Lav, CMA on 08/18/2023  2:18 PM.       Discussed the use of AI scribe software for clinical note transcription with the patient, who gave verbal consent to proceed.  History of Present Illness   The patient, with a history of heart disease, presents for a follow-up visit. He reports experiencing chest tightness, which led to a cardiology visit and an attempted stent placement. However, the procedure was unsuccessful due to the length of the blockage. The patient sought a second opinion and was informed of four blockages, two at 50% and two at 70%. The patient is currently considering a procedure involving a small drill to clear the blockages, but expresses concerns about the risks involved. He has a scheduled virtual visit with the head of cardiology at [facility] to discuss further.  The patient also mentions a family history of heart disease and diabetes, with a brother and two uncles who died of heart attacks in his fifties. The patient is past this age and expresses concern about his own risk. He also mentions a history of COVID-19, but does not report any current symptoms related to this.  In  addition to his heart concerns, the patient mentions a postponed knee surgery due to the potential risks associated with his heart condition. He expresses relief at having postponed the surgery, given the potential complications that could have arisen due to his heart condition.         Medications: Outpatient Medications Prior to Visit  Medication Sig   acetaminophen (TYLENOL) 500 MG tablet Take 1,000 mg by mouth 2 (two) times daily.   albuterol (VENTOLIN HFA) 108 (90 Base) MCG/ACT inhaler TAKE 2 PUFFS BY MOUTH EVERY 6 HOURS AS NEEDED FOR WHEEZE OR SHORTNESS OF BREATH   amLODipine (NORVASC) 2.5 MG tablet Take 1 tablet (2.5 mg total) by mouth daily.   aspirin 81 MG tablet Take 81 mg by mouth daily.   atorvastatin (LIPITOR) 40 MG tablet Take 40 mg by mouth daily.   Dextromethorphan HBr (DELSYM PO) Take by mouth as needed.   ezetimibe (ZETIA) 10 MG tablet Take 1 tablet (10 mg total) by mouth daily.   famotidine (PEPCID) 20 MG tablet Take 20 mg by mouth 2 (two) times daily. Maximum strenght   ipratropium (ATROVENT) 0.03 % nasal spray Place 2 sprays into both nostrils every 12 (twelve) hours. (Patient taking differently: Place 2 sprays into both nostrils daily as needed (Allergies).)   loratadine (CLARITIN) 10 MG tablet Take 1 tablet (10 mg total) by mouth daily.   losartan (COZAAR) 50 MG tablet TAKE 1  TABLET BY MOUTH EVERY DAY   Misc Natural Products (OSTEO BI-FLEX TRIPLE STRENGTH) TABS Take 2 tablets by mouth daily.   montelukast (SINGULAIR) 10 MG tablet TAKE 1 TABLET BY MOUTH EVERYDAY AT BEDTIME   Pseudoephedrine-guaiFENesin (MUCINEX D PO) Take by mouth as needed.   [DISCONTINUED] azithromycin (ZITHROMAX) 250 MG tablet Take 500mg  PO daily x1d and then 250mg  daily x4 days   No facility-administered medications prior to visit.    Review of Systems     Objective    BP 113/81 (BP Location: Left Arm, Patient Position: Sitting, Cuff Size: Large)   Pulse 82   Ht 5\' 11"  (1.803 m)   Wt 208  lb 11.2 oz (94.7 kg)   SpO2 97%   BMI 29.11 kg/m    Physical Exam Vitals reviewed.  Constitutional:      General: He is not in acute distress.    Appearance: Normal appearance. He is not diaphoretic.  HENT:     Head: Normocephalic and atraumatic.  Eyes:     General: No scleral icterus.    Conjunctiva/sclera: Conjunctivae normal.  Cardiovascular:     Rate and Rhythm: Normal rate and regular rhythm.     Heart sounds: Normal heart sounds. No murmur heard. Pulmonary:     Effort: Pulmonary effort is normal. No respiratory distress.     Breath sounds: Normal breath sounds. No wheezing or rhonchi.  Musculoskeletal:     Cervical back: Neck supple.     Right lower leg: No edema.     Left lower leg: No edema.  Lymphadenopathy:     Cervical: No cervical adenopathy.  Skin:    General: Skin is warm and dry.     Findings: No rash.  Neurological:     Mental Status: He is alert and oriented to person, place, and time. Mental status is at baseline.  Psychiatric:        Mood and Affect: Mood normal.        Behavior: Behavior normal.      No results found for any visits on 08/18/23.  Assessment & Plan     Problem List Items Addressed This Visit       Cardiovascular and Mediastinum   Atherosclerosis of coronary artery   CAD with four blockages (two at 50% and two at 70%). Initial stent placement unsuccessful due to blockage length. Currently on optimal medical therapy. Symptoms include intermittent chest tightness and arm pressure. Family history of heart disease. Scheduled for a virtual visit with an interventional cardiologist at [facility] on August 30, 2023, and a follow-up with a cardiologist in [place] on August 26, 2023. Discussed potential procedures including lithotripsy and risks of embolism from dislodging plaque. Emphasized the importance of a well-thought-out plan.   - Continue current medications: amlodipine 2.5 mg daily, baby aspirin, Lipitor 40 mg daily, Zetia 10 mg daily,  losartan 50 mg daily   - Attend virtual visit with interventional cardiologist at [facility] on August 30, 2023   - Follow up with cardiologist in [place] on August 26, 2023   - Monitor for symptoms of crushing chest pain and go to ER if he occurs   - Use nitroglycerin as needed for chest pain        Primary hypertension - Primary   Blood pressure is well-controlled with current medication regimen. Recent readings include 127/80 mmHg at home and 130/xx mmHg at a recent visit.   - Continue current medications: losartan 50 mg daily   - Monitor blood pressure  weekly at home          Other   Combined fat and carbohydrate induced hyperlipemia   Cholesterol levels are well-controlled with current medication regimen. Recent LDL was 55 mg/dL, below the target of 70 mg/dL.   - Continue current medications: Lipitor 40 mg daily, Zetia 10 mg daily           General Health Maintenance   Recent labs in December showed normal liver and kidney function, normal electrolytes, and a glucose level of 77 mg/dL. BUN was slightly low, likely due to mild dehydration. No signs of diabetes despite family history.   - Schedule physical exam for summer 2025.        Return in about 6 months (around 02/15/2024) for CPE.       Shirlee Latch, MD  [facility] [phone] (phone) [phone] (fax)  [facility]

## 2023-08-18 NOTE — Assessment & Plan Note (Signed)
Blood pressure is well-controlled with current medication regimen. Recent readings include 127/80 mmHg at home and 130/xx mmHg at a recent visit.   - Continue current medications: losartan 50 mg daily   - Monitor blood pressure weekly at home

## 2023-08-30 DIAGNOSIS — I25118 Atherosclerotic heart disease of native coronary artery with other forms of angina pectoris: Secondary | ICD-10-CM | POA: Diagnosis not present

## 2023-08-30 DIAGNOSIS — E782 Mixed hyperlipidemia: Secondary | ICD-10-CM | POA: Diagnosis not present

## 2023-08-30 DIAGNOSIS — I10 Essential (primary) hypertension: Secondary | ICD-10-CM | POA: Diagnosis not present

## 2023-09-08 ENCOUNTER — Ambulatory Visit: Payer: HMO | Admitting: Cardiology

## 2023-09-19 DIAGNOSIS — M170 Bilateral primary osteoarthritis of knee: Secondary | ICD-10-CM | POA: Diagnosis not present

## 2023-09-19 DIAGNOSIS — Z7982 Long term (current) use of aspirin: Secondary | ICD-10-CM | POA: Diagnosis not present

## 2023-09-19 DIAGNOSIS — I10 Essential (primary) hypertension: Secondary | ICD-10-CM | POA: Diagnosis not present

## 2023-09-19 DIAGNOSIS — R079 Chest pain, unspecified: Secondary | ICD-10-CM | POA: Diagnosis not present

## 2023-09-19 DIAGNOSIS — Z791 Long term (current) use of non-steroidal anti-inflammatories (NSAID): Secondary | ICD-10-CM | POA: Diagnosis not present

## 2023-09-19 DIAGNOSIS — I517 Cardiomegaly: Secondary | ICD-10-CM | POA: Diagnosis not present

## 2023-09-19 DIAGNOSIS — I2510 Atherosclerotic heart disease of native coronary artery without angina pectoris: Secondary | ICD-10-CM | POA: Diagnosis not present

## 2023-09-19 DIAGNOSIS — I25118 Atherosclerotic heart disease of native coronary artery with other forms of angina pectoris: Secondary | ICD-10-CM | POA: Diagnosis not present

## 2023-09-19 DIAGNOSIS — Z79899 Other long term (current) drug therapy: Secondary | ICD-10-CM | POA: Diagnosis not present

## 2023-09-19 DIAGNOSIS — E7800 Pure hypercholesterolemia, unspecified: Secondary | ICD-10-CM | POA: Diagnosis not present

## 2023-09-23 DIAGNOSIS — I25118 Atherosclerotic heart disease of native coronary artery with other forms of angina pectoris: Secondary | ICD-10-CM | POA: Diagnosis not present

## 2023-09-23 DIAGNOSIS — I2510 Atherosclerotic heart disease of native coronary artery without angina pectoris: Secondary | ICD-10-CM | POA: Diagnosis not present

## 2023-09-23 DIAGNOSIS — E782 Mixed hyperlipidemia: Secondary | ICD-10-CM | POA: Diagnosis not present

## 2023-09-23 DIAGNOSIS — I10 Essential (primary) hypertension: Secondary | ICD-10-CM | POA: Diagnosis not present

## 2023-09-26 DIAGNOSIS — Z9861 Coronary angioplasty status: Secondary | ICD-10-CM | POA: Diagnosis not present

## 2023-09-26 DIAGNOSIS — I2510 Atherosclerotic heart disease of native coronary artery without angina pectoris: Secondary | ICD-10-CM | POA: Diagnosis not present

## 2023-09-27 DIAGNOSIS — Z9861 Coronary angioplasty status: Secondary | ICD-10-CM | POA: Diagnosis not present

## 2023-09-27 DIAGNOSIS — I2510 Atherosclerotic heart disease of native coronary artery without angina pectoris: Secondary | ICD-10-CM | POA: Diagnosis not present

## 2023-09-28 ENCOUNTER — Other Ambulatory Visit: Payer: Self-pay | Admitting: Family Medicine

## 2023-09-28 DIAGNOSIS — J3089 Other allergic rhinitis: Secondary | ICD-10-CM

## 2023-09-29 NOTE — Telephone Encounter (Signed)
 Requested Prescriptions  Pending Prescriptions Disp Refills   montelukast (SINGULAIR) 10 MG tablet [Pharmacy Med Name: MONTELUKAST SODIUM 10 MG TAB] 90 tablet 1    Sig: TAKE 1 TABLET BY MOUTH NIGHTLY     Pulmonology:  Leukotriene Inhibitors Passed - 09/29/2023  4:34 PM      Passed - Valid encounter within last 12 months    Recent Outpatient Visits           1 month ago Primary hypertension   [facility] McEwen, Marzella Schlein, MD       Future Appointments             In 4 months Bacigalupo, Marzella Schlein, MD [facility]

## 2023-10-12 ENCOUNTER — Encounter: Attending: Cardiology

## 2023-10-12 ENCOUNTER — Other Ambulatory Visit: Payer: Self-pay

## 2023-10-12 DIAGNOSIS — Z48812 Encounter for surgical aftercare following surgery on the circulatory system: Secondary | ICD-10-CM | POA: Insufficient documentation

## 2023-10-12 DIAGNOSIS — Z955 Presence of coronary angioplasty implant and graft: Secondary | ICD-10-CM | POA: Insufficient documentation

## 2023-10-12 NOTE — Progress Notes (Signed)
 Virtual Visit completed. Patient informed on EP and RD appointment and 6 Minute walk test. Patient also informed of patient health questionnaires on My Chart. Patient Verbalizes understanding. Visit diagnosis can be found in [facility] 09/23/2023.

## 2023-10-19 ENCOUNTER — Encounter

## 2023-10-19 VITALS — Ht 70.5 in | Wt 208.3 lb

## 2023-10-19 DIAGNOSIS — Z955 Presence of coronary angioplasty implant and graft: Secondary | ICD-10-CM | POA: Diagnosis not present

## 2023-10-19 DIAGNOSIS — Z48812 Encounter for surgical aftercare following surgery on the circulatory system: Secondary | ICD-10-CM | POA: Diagnosis not present

## 2023-10-19 NOTE — Progress Notes (Signed)
 Cardiac Individual Treatment Plan  Patient Details  Name: Ryan English MRN: [id number] Date of Birth: Jun 13, 1951 Referring Provider:   Flowsheet Row Cardiac Rehab from 10/19/2023 in [facility] Cardiac and Pulmonary Rehab  Referring Provider Lavonna Monarch, MD       Initial Encounter Date:  Flowsheet Row Cardiac Rehab from 10/19/2023 in [facility] Cardiac and Pulmonary Rehab  Date 10/19/23       Visit Diagnosis: Status post coronary artery stent placement  Patient's Home Medications on Admission:  Current Outpatient Medications:    acetaminophen (TYLENOL) 500 MG tablet, Take 1,000 mg by mouth 2 (two) times daily., Disp: , Rfl:    acetaminophen (TYLENOL) 500 MG tablet, Take by mouth., Disp: , Rfl:    albuterol (VENTOLIN HFA) 108 (90 Base) MCG/ACT inhaler, TAKE 2 PUFFS BY MOUTH EVERY 6 HOURS AS NEEDED FOR WHEEZE OR SHORTNESS OF BREATH, Disp: 18 each, Rfl: 0   amLODipine (NORVASC) 2.5 MG tablet, Take 1 tablet (2.5 mg total) by mouth daily., Disp: 30 tablet, Rfl: 11   aspirin 81 MG tablet, Take 81 mg by mouth daily., Disp: , Rfl:    aspirin EC 81 MG tablet, Take by mouth., Disp: , Rfl:    atorvastatin (LIPITOR) 40 MG tablet, Take 40 mg by mouth daily., Disp: , Rfl:    Dextromethorphan HBr (DELSYM PO), Take by mouth as needed., Disp: , Rfl:    ezetimibe (ZETIA) 10 MG tablet, Take 1 tablet (10 mg total) by mouth daily., Disp: 90 tablet, Rfl: 3   famotidine (PEPCID) 20 MG tablet, Take 20 mg by mouth 2 (two) times daily. Maximum strenght, Disp: , Rfl:    famotidine (PEPCID) 20 MG tablet, Take by mouth., Disp: , Rfl:    ipratropium (ATROVENT HFA) 17 MCG/ACT inhaler, Inhale into the lungs., Disp: , Rfl:    ipratropium (ATROVENT) 0.03 % nasal spray, Place 2 sprays into both nostrils every 12 (twelve) hours. (Patient taking differently: Place 2 sprays into both nostrils daily as needed (Allergies).), Disp: 30 mL, Rfl: 12   loratadine (CLARITIN REDITABS) 10 MG dissolvable tablet, Dissolve 1 tablet (10 mg  total) in the mouth daily., Disp: , Rfl:    loratadine (CLARITIN) 10 MG tablet, Take 1 tablet (10 mg total) by mouth daily., Disp: 30 tablet, Rfl: 11   losartan (COZAAR) 50 MG tablet, TAKE 1 TABLET BY MOUTH EVERY DAY, Disp: 90 tablet, Rfl: 1   losartan (COZAAR) 50 MG tablet, Take 1 tablet by mouth daily., Disp: , Rfl:    Misc Natural Products (OSTEO BI-FLEX TRIPLE STRENGTH) TABS, Take 2 tablets by mouth daily., Disp: , Rfl:    montelukast (SINGULAIR) 10 MG tablet, TAKE 1 TABLET BY MOUTH NIGHTLY, Disp: 90 tablet, Rfl: 1   montelukast (SINGULAIR) 10 MG tablet, Take by mouth., Disp: , Rfl:    nitroGLYCERIN (NITROSTAT) 0.4 MG SL tablet, Place under the tongue., Disp: , Rfl:    prasugrel (EFFIENT) 10 MG TABS tablet, Take 1 tablet by mouth daily., Disp: , Rfl:    Pseudoephedrine-guaiFENesin (MUCINEX D PO), Take by mouth as needed., Disp: , Rfl:   Past Medical History: Past Medical History:  Diagnosis Date   Anginal pain (HCC)    Arthritis    GERD (gastroesophageal reflux disease)    Hyperlipidemia     Tobacco Use: Social History   Tobacco Use  Smoking Status Never  Smokeless Tobacco Never    Labs: Review Flowsheet  More data exists      Latest Ref Rng & Units 02/07/2019  02/01/2020 02/02/2021 02/10/2022 02/17/2023  Labs for ITP Cardiac and Pulmonary Rehab  Cholestrol 100 - 199 mg/dL 161  096  045  409  811   LDL (calc) 0 - 99 mg/dL 69  63  69  67  61   HDL-C >39 mg/dL 48  47  52  42  44   Trlycerides 0 - 149 mg/dL 72  90  914  782  956   Hemoglobin A1c 4.8 - 5.6 % - - 5.7  5.5  5.5      Exercise Target Goals: Exercise Program Goal: Individual exercise prescription set using results from initial 6 min walk test and THRR while considering  patient's activity barriers and safety.   Exercise Prescription Goal: Initial exercise prescription builds to 30-45 minutes a day of aerobic activity, 2-3 days per week.  Home exercise guidelines will be given to patient during program as part of  exercise prescription that the participant will acknowledge.   Education: Aerobic Exercise: - Group verbal and visual presentation on the components of exercise prescription. Introduces F.I.T.T principle from ACSM for exercise prescriptions.  Reviews F.I.T.T. principles of aerobic exercise including progression. Written material given at graduation.   Education: Resistance Exercise: - Group verbal and visual presentation on the components of exercise prescription. Introduces F.I.T.T principle from ACSM for exercise prescriptions  Reviews F.I.T.T. principles of resistance exercise including progression. Written material given at graduation.    Education: Exercise & Equipment Safety: - Individual verbal instruction and demonstration of equipment use and safety with use of the equipment. Flowsheet Row Cardiac Rehab from 10/12/2023 in [facility] Cardiac and Pulmonary Rehab  Date 10/12/23  Educator jh  Instruction Review Code 1- Verbalizes Understanding       Education: Exercise Physiology & General Exercise Guidelines: - Group verbal and written instruction with models to review the exercise physiology of the cardiovascular system and associated critical values. Provides general exercise guidelines with specific guidelines to those with heart or lung disease.    Education: Flexibility, Balance, Mind/Body Relaxation: - Group verbal and visual presentation with interactive activity on the components of exercise prescription. Introduces F.I.T.T principle from ACSM for exercise prescriptions. Reviews F.I.T.T. principles of flexibility and balance exercise training including progression. Also discusses the mind body connection.  Reviews various relaxation techniques to help reduce and manage stress (i.e. Deep breathing, progressive muscle relaxation, and visualization). Balance handout provided to take home. Written material given at graduation.   Activity Barriers & Risk Stratification:  Activity  Barriers & Cardiac Risk Stratification - 10/19/23 1135       Activity Barriers & Cardiac Risk Stratification   Activity Barriers Other (comment);Neck/Spine Problems;Arthritis    Comments Right and Left knees need to be replaced.    Cardiac Risk Stratification High             6 Minute Walk:  6 Minute Walk     Row Name 10/19/23 1131         6 Minute Walk   Phase Initial     Distance 1135 feet     Walk Time 6 minutes     # of Rest Breaks 0     MPH 2.15     METS 2.42     RPE 11     Perceived Dyspnea  0     VO2 Peak 8.47     Symptoms Yes (comment)     Comments knee soreness     Resting HR 66 bpm     Resting  BP 136/70     Resting Oxygen Saturation  97 %     Exercise Oxygen Saturation  during 6 min walk 97 %     Max Ex. HR 95 bpm     Max Ex. BP 144/72     2 Minute Post BP 128/70              Oxygen Initial Assessment:   Oxygen Re-Evaluation:   Oxygen Discharge (Final Oxygen Re-Evaluation):   Initial Exercise Prescription:  Initial Exercise Prescription - 10/19/23 1100       Date of Initial Exercise RX and Referring Provider   Date 10/19/23    Referring Provider Valery Gaucher, MD      Oxygen   Maintain Oxygen Saturation 88% or higher      Treadmill   MPH 2.2    Grade 0    Minutes 15    METs 2.68      NuStep   Level 3    SPM 80    Minutes 15    METs 2.42      REL-XR   Level 2    Speed 50    Minutes 15    METs 2.42      Prescription Details   Frequency (times per week) 2    Duration Progress to 30 minutes of continuous aerobic without signs/symptoms of physical distress      Intensity   THRR 40-80% of Max Heartrate 98-130    Ratings of Perceived Exertion 11-13    Perceived Dyspnea 0-4      Progression   Progression Continue to progress workloads to maintain intensity without signs/symptoms of physical distress.      Resistance Training   Training Prescription Yes    Weight 10 lb    Reps 10-15             Perform  Capillary Blood Glucose checks as needed.  Exercise Prescription Changes:   Exercise Prescription Changes     Row Name 10/19/23 1100             Response to Exercise   Blood Pressure (Admit) 136/70       Blood Pressure (Exercise) 144/72       Blood Pressure (Exit) 128/70       Heart Rate (Admit) 66 bpm       Heart Rate (Exercise) 95 bpm       Heart Rate (Exit) 58 bpm       Oxygen Saturation (Admit) 97 %       Oxygen Saturation (Exercise) 97 %       Rating of Perceived Exertion (Exercise) 11       Perceived Dyspnea (Exercise) 0       Symptoms knee soreness       Comments Results                Exercise Comments:   Exercise Goals and Review:   Exercise Goals     Row Name 10/19/23 1135             Exercise Goals   Increase Physical Activity Yes       Intervention Provide advice, education, support and counseling about physical activity/exercise needs.;Develop an individualized exercise prescription for aerobic and resistive training based on initial evaluation findings, risk stratification, comorbidities and participant's personal goals.       Expected Outcomes Short Term: Attend rehab on a regular basis to increase amount of physical activity.;Long Term: Add in home  exercise to make exercise part of routine and to increase amount of physical activity.;Long Term: Exercising regularly at least 3-5 days a week.       Increase Strength and Stamina Yes       Intervention Provide advice, education, support and counseling about physical activity/exercise needs.;Develop an individualized exercise prescription for aerobic and resistive training based on initial evaluation findings, risk stratification, comorbidities and participant's personal goals.       Expected Outcomes Short Term: Increase workloads from initial exercise prescription for resistance, speed, and METs.;Short Term: Perform resistance training exercises routinely during rehab and add in resistance training  at home;Long Term: Improve cardiorespiratory fitness, muscular endurance and strength as measured by increased METs and functional capacity ( )       Able to understand and use rate of perceived exertion (RPE) scale Yes       Intervention Provide education and explanation on how to use RPE scale       Expected Outcomes Short Term: Able to use RPE daily in rehab to express subjective intensity level;Long Term:  Able to use RPE to guide intensity level when exercising independently       Able to understand and use Dyspnea scale Yes       Intervention Provide education and explanation on how to use Dyspnea scale       Expected Outcomes Short Term: Able to use Dyspnea scale daily in rehab to express subjective sense of shortness of breath during exertion;Long Term: Able to use Dyspnea scale to guide intensity level when exercising independently       Knowledge and understanding of Target Heart Rate Range (THRR) Yes       Intervention Provide education and explanation of THRR including how the numbers were predicted and where they are located for reference       Expected Outcomes Long Term: Able to use THRR to govern intensity when exercising independently;Short Term: Able to state/look up THRR;Short Term: Able to use daily as guideline for intensity in rehab       Able to check pulse independently Yes       Intervention Provide education and demonstration on how to check pulse in carotid and radial arteries.;Review the importance of being able to check your own pulse for safety during independent exercise       Expected Outcomes Short Term: Able to explain why pulse checking is important during independent exercise;Long Term: Able to check pulse independently and accurately       Understanding of Exercise Prescription Yes       Intervention Provide education, explanation, and written materials on patient's individual exercise prescription       Expected Outcomes Short Term: Able to explain program  exercise prescription;Long Term: Able to explain home exercise prescription to exercise independently                Exercise Goals Re-Evaluation :   Discharge Exercise Prescription (Final Exercise Prescription Changes):  Exercise Prescription Changes - 10/19/23 1100       Response to Exercise   Blood Pressure (Admit) 136/70    Blood Pressure (Exercise) 144/72    Blood Pressure (Exit) 128/70    Heart Rate (Admit) 66 bpm    Heart Rate (Exercise) 95 bpm    Heart Rate (Exit) 58 bpm    Oxygen Saturation (Admit) 97 %    Oxygen Saturation (Exercise) 97 %    Rating of Perceived Exertion (Exercise) 11    Perceived Dyspnea (Exercise) 0  Symptoms knee soreness    Comments Results             Nutrition:  Target Goals: Understanding of nutrition guidelines, daily intake of sodium 1500mg , cholesterol 200mg , calories 30% from fat and 7% or less from saturated fats, daily to have 5 or more servings of fruits and vegetables.  Education: All About Nutrition: -Group instruction provided by verbal, written material, interactive activities, discussions, models, and posters to present general guidelines for heart healthy nutrition including fat, fiber, MyPlate, the role of sodium in heart healthy nutrition, utilization of the nutrition label, and utilization of this knowledge for meal planning. Follow up email sent as well. Written material given at graduation.   Biometrics:  Pre Biometrics - 10/19/23 1136       Pre Biometrics   Height 5' 10.5" (1.791 m)    Weight 208 lb 4.8 oz (94.5 kg)    Waist Circumference 39 inches    Hip Circumference 40 inches    Waist to Hip Ratio 0.98 %    BMI (Calculated) 29.46    Single Leg Stand 6.55 seconds              Nutrition Therapy Plan and Nutrition Goals:  Nutrition Therapy & Goals - 10/19/23 1130       Nutrition Therapy   RD appointment deferred Yes      Intervention Plan   Intervention Prescribe, educate and counsel  regarding individualized specific dietary modifications aiming towards targeted core components such as weight, hypertension, lipid management, diabetes, heart failure and other comorbidities.    Expected Outcomes Short Term Goal: Understand basic principles of dietary content, such as calories, fat, sodium, cholesterol and nutrients.;Short Term Goal: A plan has been developed with personal nutrition goals set during dietitian appointment.;Long Term Goal: Adherence to prescribed nutrition plan.             Nutrition Assessments:  MEDIFICTS Score Key: >=70 Need to make dietary changes  40-70 Heart Healthy Diet <= 40 Therapeutic Level Cholesterol Diet   Picture Your Plate Scores: <40 Unhealthy dietary pattern with much room for improvement. 41-50 Dietary pattern unlikely to meet recommendations for good health and room for improvement. 51-60 More healthful dietary pattern, with some room for improvement.  >60 Healthy dietary pattern, although there may be some specific behaviors that could be improved.    Nutrition Goals Re-Evaluation:   Nutrition Goals Discharge (Final Nutrition Goals Re-Evaluation):   Psychosocial: Target Goals: Acknowledge presence or absence of significant depression and/or stress, maximize coping skills, provide positive support system. Participant is able to verbalize types and ability to use techniques and skills needed for reducing stress and depression.   Education: Stress, Anxiety, and Depression - Group verbal and visual presentation to define topics covered.  Reviews how body is impacted by stress, anxiety, and depression.  Also discusses healthy ways to reduce stress and to treat/manage anxiety and depression.  Written material given at graduation.   Education: Sleep Hygiene -Provides group verbal and written instruction about how sleep can affect your health.  Define sleep hygiene, discuss sleep cycles and impact of sleep habits. Review good sleep  hygiene tips.    Initial Review & Psychosocial Screening:  Initial Psych Review & Screening - 10/12/23 1426       Initial Review   Current issues with None Identified      Family Dynamics   Good Support System? Yes    Comments He can look to his wife and two sons  for support. He states no issues with his mental state and does not take any medication for his mood.      Barriers   Psychosocial barriers to participate in program The patient should benefit from training in stress management and relaxation.;There are no identifiable barriers or psychosocial needs.      Screening Interventions   Interventions Provide feedback about the scores to participant;Program counselor consult;To provide support and resources with identified psychosocial needs    Expected Outcomes Short Term goal: Utilizing psychosocial counselor, staff and physician to assist with identification of specific Stressors or current issues interfering with healing process. Setting desired goal for each stressor or current issue identified.;Long Term Goal: Stressors or current issues are controlled or eliminated.;Short Term goal: Identification and review with participant of any Quality of Life or Depression concerns found by scoring the questionnaire.;Long Term goal: The participant improves quality of Life and PHQ9 Scores as seen by post scores and/or verbalization of changes             Quality of Life Scores:   Scores of 19 and below usually indicate a poorer quality of life in these areas.  A difference of  2-3 points is a clinically meaningful difference.  A difference of 2-3 points in the total score of the Quality of Life Index has been associated with significant improvement in overall quality of life, self-image, physical symptoms, and general health in studies assessing change in quality of life.  PHQ-9: Review Flowsheet  More data exists      10/19/2023 08/18/2023 02/17/2023 01/19/2023 11/15/2022  Depression  screen PHQ 2/9  Decreased Interest 0 0 0 0 0  Down, Depressed, Hopeless 0 0 0 0 0  PHQ - 2 Score 0 0 0 0 0  Altered sleeping 0 - - - 0  Tired, decreased energy 0 - - - 0  Change in appetite 0 - - - 0  Feeling bad or failure about yourself  0 - - - 0  Trouble concentrating 0 - - - 0  Moving slowly or fidgety/restless 0 - - - 0  Suicidal thoughts 0 - - - 0  PHQ-9 Score 0 - - - 0  Difficult doing work/chores - - - - Not difficult at all   Interpretation of Total Score  Total Score Depression Severity:  1-4 = Minimal depression, 5-9 = Mild depression, 10-14 = Moderate depression, 15-19 = Moderately severe depression, 20-27 = Severe depression   Psychosocial Evaluation and Intervention:  Psychosocial Evaluation - 10/12/23 1427       Psychosocial Evaluation & Interventions   Interventions Encouraged to exercise with the program and follow exercise prescription;Relaxation education;Stress management education    Comments He can look to his wife and two sons for support. He states no issues with his mental state and does not take any medication for his mood.    Expected Outcomes Short: Start [facility] to help with mood. Long: Maintain a healthy mental state    Continue Psychosocial Services  Follow up required by staff             Psychosocial Re-Evaluation:   Psychosocial Discharge (Final Psychosocial Re-Evaluation):   Vocational Rehabilitation: Provide vocational rehab assistance to qualifying candidates.   Vocational Rehab Evaluation & Intervention:   Education: Education Goals: Education classes will be provided on a variety of topics geared toward better understanding of heart health and risk factor modification. Participant will state understanding/return demonstration of topics presented as noted by education test scores.  Learning Barriers/Preferences:  Learning Barriers/Preferences - 10/12/23 1426       Learning Barriers/Preferences   Learning Barriers None     Learning Preferences None             General Cardiac Education Topics:  AED/CPR: - Group verbal and written instruction with the use of models to demonstrate the basic use of the AED with the basic ABC's of resuscitation.   Anatomy and Cardiac Procedures: - Group verbal and visual presentation and models provide information about basic cardiac anatomy and function. Reviews the testing methods done to diagnose heart disease and the outcomes of the test results. Describes the treatment choices: Medical Management, Angioplasty, or Coronary Bypass Surgery for treating various heart conditions including Myocardial Infarction, Angina, Valve Disease, and Cardiac Arrhythmias.  Written material given at graduation.   Medication Safety: - Group verbal and visual instruction to review commonly prescribed medications for heart and lung disease. Reviews the medication, class of the drug, and side effects. Includes the steps to properly store meds and maintain the prescription regimen.  Written material given at graduation.   Intimacy: - Group verbal instruction through game format to discuss how heart and lung disease can affect sexual intimacy. Written material given at graduation..   Know Your Numbers and Heart Failure: - Group verbal and visual instruction to discuss disease risk factors for cardiac and pulmonary disease and treatment options.  Reviews associated critical values for Overweight/Obesity, Hypertension, Cholesterol, and Diabetes.  Discusses basics of heart failure: signs/symptoms and treatments.  Introduces Heart Failure Zone chart for action plan for heart failure.  Written material given at graduation.   Infection Prevention: - Provides verbal and written material to individual with discussion of infection control including proper hand washing and proper equipment cleaning during exercise session. Flowsheet Row Cardiac Rehab from 10/12/2023 in [facility] Cardiac and Pulmonary Rehab   Date 10/12/23  Educator jh  Instruction Review Code 1- Verbalizes Understanding       Falls Prevention: - Provides verbal and written material to individual with discussion of falls prevention and safety. Flowsheet Row Cardiac Rehab from 10/12/2023 in [facility] Cardiac and Pulmonary Rehab  Date 10/12/23  Educator jh  Instruction Review Code 1- Verbalizes Understanding       Other: -Provides group and verbal instruction on various topics (see comments)   Knowledge Questionnaire Score:   Core Components/Risk Factors/Patient Goals at Admission:  Personal Goals and Risk Factors at Admission - 10/12/23 1425       Core Components/Risk Factors/Patient Goals on Admission    Weight Management Yes;Weight Maintenance;Weight Loss    Intervention Weight Management: Develop a combined nutrition and exercise program designed to reach desired caloric intake, while maintaining appropriate intake of nutrient and fiber, sodium and fats, and appropriate energy expenditure required for the weight goal.;Weight Management: Provide education and appropriate resources to help participant work on and attain dietary goals.;Weight Management/Obesity: Establish reasonable short term and long term weight goals.    Expected Outcomes Short Term: Continue to assess and modify interventions until short term weight is achieved;Weight Maintenance: Understanding of the daily nutrition guidelines, which includes 25-35% calories from fat, 7% or less cal from saturated fats, less than 200mg  cholesterol, less than 1.5gm of sodium, & 5 or more servings of fruits and vegetables daily;Understanding recommendations for meals to include 15-35% energy as protein, 25-35% energy from fat, 35-60% energy from carbohydrates, less than 200mg  of dietary cholesterol, 20-35 gm of total fiber daily;Weight Loss: Understanding of general recommendations for a  balanced deficit meal plan, which promotes 1-2 lb weight loss per week and includes a  negative energy balance of [phone] kcal/d;Understanding of distribution of calorie intake throughout the day with the consumption of 4-5 meals/snacks    Heart Failure Yes    Intervention Provide a combined exercise and nutrition program that is supplemented with education, support and counseling about heart failure. Directed toward relieving symptoms such as shortness of breath, decreased exercise tolerance, and extremity edema.    Expected Outcomes Improve functional capacity of life;Short term: Attendance in program 2-3 days a week with increased exercise capacity. Reported lower sodium intake. Reported increased fruit and vegetable intake. Reports medication compliance.;Short term: Daily weights obtained and reported for increase. Utilizing diuretic protocols set by physician.;Long term: Adoption of self-care skills and reduction of barriers for early signs and symptoms recognition and intervention leading to self-care maintenance.    Hypertension Yes    Intervention Provide education on lifestyle modifcations including regular physical activity/exercise, weight management, moderate sodium restriction and increased consumption of fresh fruit, vegetables, and low fat dairy, alcohol moderation, and smoking cessation.;Monitor prescription use compliance.    Expected Outcomes Short Term: Continued assessment and intervention until BP is < 140/70mm HG in hypertensive participants. < 130/34mm HG in hypertensive participants with diabetes, heart failure or chronic kidney disease.;Long Term: Maintenance of blood pressure at goal levels.    Lipids Yes    Intervention Provide education and support for participant on nutrition & aerobic/resistive exercise along with prescribed medications to achieve LDL 70mg , HDL >40mg .    Expected Outcomes Short Term: Participant states understanding of desired cholesterol values and is compliant with medications prescribed. Participant is following exercise prescription and  nutrition guidelines.;Long Term: Cholesterol controlled with medications as prescribed, with individualized exercise RX and with personalized nutrition plan. Value goals: LDL < 70mg , HDL > 40 mg.             Education:Diabetes - Individual verbal and written instruction to review signs/symptoms of diabetes, desired ranges of glucose level fasting, after meals and with exercise. Acknowledge that pre and post exercise glucose checks will be done for 3 sessions at entry of program.   Core Components/Risk Factors/Patient Goals Review:    Core Components/Risk Factors/Patient Goals at Discharge (Final Review):    ITP Comments:  ITP Comments     Row Name 10/12/23 1425 10/19/23 1129         ITP Comments Virtual Visit completed. Patient informed on EP and RD appointment and 6 Minute walk test. Patient also informed of patient health questionnaires on My Chart. Patient Verbalizes understanding. Visit diagnosis can [facility] found in [facility] 09/23/[facility]. Patient is to bring in medication list. Completed and gym orientation for cardiac rehab. Initial ITP created and sent for review to Dr. Firman Hughes, Medical Director.               Comments: Initial ITP

## 2023-10-19 NOTE — Patient Instructions (Addendum)
 Patient Instructions  Patient Details  Name: Ryan English MRN: [id number] Date of Birth: 1950/08/25 Referring Provider:  Lavonna Monarch, MD  Below are your personal goals for exercise, nutrition, and risk factors. Our goal is to help you stay on track towards obtaining and maintaining these goals. We will be discussing your progress on these goals with you throughout the program.  Initial Exercise Prescription:  Initial Exercise Prescription - 10/19/23 1100       Date of Initial Exercise RX and Referring Provider   Date 10/19/23    Referring Provider Lavonna Monarch, MD      Oxygen   Maintain Oxygen Saturation 88% or higher      Treadmill   MPH 2.2    Grade 0    Minutes 15    METs 2.68      NuStep   Level 3    SPM 80    Minutes 15    METs 2.42      REL-XR   Level 2    Speed 50    Minutes 15    METs 2.42      Prescription Details   Frequency (times per week) 2    Duration Progress to 30 minutes of continuous aerobic without signs/symptoms of physical distress      Intensity   THRR 40-80% of Max Heartrate 98-130    Ratings of Perceived Exertion 11-13    Perceived Dyspnea 0-4      Progression   Progression Continue to progress workloads to maintain intensity without signs/symptoms of physical distress.      Resistance Training   Training Prescription Yes    Weight 10 lb    Reps 10-15             Exercise Goals: Frequency: Be able to perform aerobic exercise two to three times per week in program working toward 2-5 days per week of home exercise.  Intensity: Work with a perceived exertion of 11 (fairly light) - 15 (hard) while following your exercise prescription.  We will make changes to your prescription with you as you progress through the program.   Duration: Be able to do 30 to 45 minutes of continuous aerobic exercise in addition to a 5 minute warm-up and a 5 minute cool-down routine.   Nutrition Goals: Your personal nutrition goals will be  established when you do your nutrition analysis with the dietician.  The following are general nutrition guidelines to follow: Cholesterol < 200mg /day Sodium < 1500mg /day Fiber: Men over 50 yrs - 30 grams per day  Personal Goals:  Personal Goals and Risk Factors at Admission - 10/12/23 1425       Core Components/Risk Factors/Patient Goals on Admission    Weight Management Yes;Weight Maintenance;Weight Loss    Intervention Weight Management: Develop a combined nutrition and exercise program designed to reach desired caloric intake, while maintaining appropriate intake of nutrient and fiber, sodium and fats, and appropriate energy expenditure required for the weight goal.;Weight Management: Provide education and appropriate resources to help participant work on and attain dietary goals.;Weight Management/Obesity: Establish reasonable short term and long term weight goals.    Expected Outcomes Short Term: Continue to assess and modify interventions until short term weight is achieved;Weight Maintenance: Understanding of the daily nutrition guidelines, which includes 25-35% calories from fat, 7% or less cal from saturated fats, less than 200mg  cholesterol, less than 1.5gm of sodium, & 5 or more servings of fruits and vegetables daily;Understanding recommendations for meals to include 15-35%  energy as protein, 25-35% energy from fat, 35-60% energy from carbohydrates, less than 200mg  of dietary cholesterol, 20-35 gm of total fiber daily;Weight Loss: Understanding of general recommendations for a balanced deficit meal plan, which promotes 1-2 lb weight loss per week and includes a negative energy balance of [phone] kcal/d;Understanding of distribution of calorie intake throughout the day with the consumption of 4-5 meals/snacks    Heart Failure Yes    Intervention Provide a combined exercise and nutrition program that is supplemented with education, support and counseling about heart failure. Directed  toward relieving symptoms such as shortness of breath, decreased exercise tolerance, and extremity edema.    Expected Outcomes Improve functional capacity of life;Short term: Attendance in program 2-3 days a week with increased exercise capacity. Reported lower sodium intake. Reported increased fruit and vegetable intake. Reports medication compliance.;Short term: Daily weights obtained and reported for increase. Utilizing diuretic protocols set by physician.;Long term: Adoption of self-care skills and reduction of barriers for early signs and symptoms recognition and intervention leading to self-care maintenance.    Hypertension Yes    Intervention Provide education on lifestyle modifcations including regular physical activity/exercise, weight management, moderate sodium restriction and increased consumption of fresh fruit, vegetables, and low fat dairy, alcohol moderation, and smoking cessation.;Monitor prescription use compliance.    Expected Outcomes Short Term: Continued assessment and intervention until BP is < 140/26mm HG in hypertensive participants. < 130/63mm HG in hypertensive participants with diabetes, heart failure or chronic kidney disease.;Long Term: Maintenance of blood pressure at goal levels.    Lipids Yes    Intervention Provide education and support for participant on nutrition & aerobic/resistive exercise along with prescribed medications to achieve LDL 70mg , HDL >40mg .    Expected Outcomes Short Term: Participant states understanding of desired cholesterol values and is compliant with medications prescribed. Participant is following exercise prescription and nutrition guidelines.;Long Term: Cholesterol controlled with medications as prescribed, with individualized exercise RX and with personalized nutrition plan. Value goals: LDL < 70mg , HDL > 40 mg.            Exercise Goals and Review:  Exercise Goals     Row Name 10/19/23 1135             Exercise Goals   Increase  Physical Activity Yes       Intervention Provide advice, education, support and counseling about physical activity/exercise needs.;Develop an individualized exercise prescription for aerobic and resistive training based on initial evaluation findings, risk stratification, comorbidities and participant's personal goals.       Expected Outcomes Short Term: Attend rehab on a regular basis to increase amount of physical activity.;Long Term: Add in home exercise to make exercise part of routine and to increase amount of physical activity.;Long Term: Exercising regularly at least 3-5 days a week.       Increase Strength and Stamina Yes       Intervention Provide advice, education, support and counseling about physical activity/exercise needs.;Develop an individualized exercise prescription for aerobic and resistive training based on initial evaluation findings, risk stratification, comorbidities and participant's personal goals.       Expected Outcomes Short Term: Increase workloads from initial exercise prescription for resistance, speed, and METs.;Short Term: Perform resistance training exercises routinely during rehab and add in resistance training at home;Long Term: Improve cardiorespiratory fitness, muscular endurance and strength as measured by increased METs and functional capacity ( )       Able to understand and use rate of perceived exertion (RPE) scale Yes  Intervention Provide education and explanation on how to use RPE scale       Expected Outcomes Short Term: Able to use RPE daily in rehab to express subjective intensity level;Long Term:  Able to use RPE to guide intensity level when exercising independently       Able to understand and use Dyspnea scale Yes       Intervention Provide education and explanation on how to use Dyspnea scale       Expected Outcomes Short Term: Able to use Dyspnea scale daily in rehab to express subjective sense of shortness of breath during exertion;Long Term:  Able to use Dyspnea scale to guide intensity level when exercising independently       Knowledge and understanding of Target Heart Rate Range (THRR) Yes       Intervention Provide education and explanation of THRR including how the numbers were predicted and where they are located for reference       Expected Outcomes Long Term: Able to use THRR to govern intensity when exercising independently;Short Term: Able to state/look up THRR;Short Term: Able to use daily as guideline for intensity in rehab       Able to check pulse independently Yes       Intervention Provide education and demonstration on how to check pulse in carotid and radial arteries.;Review the importance of being able to check your own pulse for safety during independent exercise       Expected Outcomes Short Term: Able to explain why pulse checking is important during independent exercise;Long Term: Able to check pulse independently and accurately       Understanding of Exercise Prescription Yes       Intervention Provide education, explanation, and written materials on patient's individual exercise prescription       Expected Outcomes Short Term: Able to explain program exercise prescription;Long Term: Able to explain home exercise prescription to exercise independently

## 2023-10-24 ENCOUNTER — Encounter

## 2023-10-24 DIAGNOSIS — Z48812 Encounter for surgical aftercare following surgery on the circulatory system: Secondary | ICD-10-CM | POA: Diagnosis not present

## 2023-10-24 DIAGNOSIS — Z955 Presence of coronary angioplasty implant and graft: Secondary | ICD-10-CM

## 2023-10-24 NOTE — Progress Notes (Signed)
 Daily Session Note  Patient Details  Name: Ryan English MRN: [id number] Date of Birth: 14-Oct-1950 Referring Provider:   Flowsheet Row Cardiac Rehab from 10/19/2023 in [facility] Cardiac and Pulmonary Rehab  Referring Provider Valery Gaucher, MD       Encounter Date: 10/24/2023  Check In:  Session Check In - 10/24/23 0803       Check-In   Supervising physician immediately available to respond to emergencies See telemetry face sheet for immediately available ER MD    Location [facility]-Cardiac & Pulmonary Rehab    Staff Present Maud Sorenson, RN, BSN, CCRP;Joseph Hood RCP,RRT,BSRT;Kelly Erie BS, ACSM CEP, Exercise Physiologist;Jason Martina Sledge RDN,LDN    Virtual Visit No    Medication changes reported     No    Fall or balance concerns reported    No    Warm-up and Cool-down Performed on first and last piece of equipment    Resistance Training Performed Yes    VAD Patient? No    PAD/SET Patient? No      Pain Assessment   Currently in Pain? No/denies                Social History   Tobacco Use  Smoking Status Never  Smokeless Tobacco Never    Goals Met:  Independence with exercise equipment Exercise tolerated well No report of concerns or symptoms today  Goals Unmet:  Not Applicable  Comments: Pt able to follow exercise prescription today without complaint.  Will continue to monitor for progression.  First full day of exercise!  Patient was oriented to gym and equipment including functions, settings, policies, and procedures.  Patient's individual exercise prescription and treatment plan were reviewed.  All starting workloads were established based on the results of the 6 minute walk test done at initial orientation visit.  The plan for exercise progression was also introduced and progression will be customized based on patient's performance and goals.   Dr. Firman Hughes is Medical Director for [facility] Cardiac Rehabilitation.  Dr. Fuad Aleskerov is Medical Director for  [facility] Pulmonary Rehabilitation.

## 2023-10-27 ENCOUNTER — Encounter: Admitting: *Deleted

## 2023-10-27 DIAGNOSIS — Z955 Presence of coronary angioplasty implant and graft: Secondary | ICD-10-CM | POA: Diagnosis not present

## 2023-10-27 DIAGNOSIS — Z48812 Encounter for surgical aftercare following surgery on the circulatory system: Secondary | ICD-10-CM | POA: Diagnosis not present

## 2023-10-27 NOTE — Progress Notes (Signed)
 Daily Session Note  Patient Details  Name: Ryan English MRN: [id number] Date of Birth: February 22, 1951 Referring Provider:   Flowsheet Row Cardiac Rehab from 10/19/2023 in [facility] Cardiac and Pulmonary Rehab  Referring Provider Valery Gaucher, MD       Encounter Date: 10/27/2023  Check In:  Session Check In - 10/27/23 0750       Check-In   Supervising physician immediately available to respond to emergencies See telemetry face sheet for immediately available ER MD    Location [facility]-Cardiac & Pulmonary Rehab    Staff Present Maud Sorenson, RN, BSN, CCRP;Joseph Hood RCP,RRT,BSRT;Noah Tickle, [place], Exercise Physiologist    Virtual Visit No    Medication changes reported     No    Fall or balance concerns reported    No    Warm-up and Cool-down Performed on first and last piece of equipment    Resistance Training Performed Yes    VAD Patient? No    PAD/SET Patient? No      Pain Assessment   Currently in Pain? No/denies                Social History   Tobacco Use  Smoking Status Never  Smokeless Tobacco Never    Goals Met:  Independence with exercise equipment Exercise tolerated well No report of concerns or symptoms today  Goals Unmet:  Not Applicable  Comments: Pt able to follow exercise prescription today without complaint.  Will continue to monitor for progression.    Dr. Firman Hughes is Medical Director for [facility] Cardiac Rehabilitation.  Dr. Fuad Aleskerov is Medical Director for [facility] Pulmonary Rehabilitation.

## 2023-10-31 ENCOUNTER — Encounter

## 2023-11-01 ENCOUNTER — Encounter: Admitting: *Deleted

## 2023-11-01 DIAGNOSIS — Z955 Presence of coronary angioplasty implant and graft: Secondary | ICD-10-CM

## 2023-11-01 DIAGNOSIS — Z48812 Encounter for surgical aftercare following surgery on the circulatory system: Secondary | ICD-10-CM | POA: Diagnosis not present

## 2023-11-01 NOTE — Progress Notes (Signed)
 Daily Session Note  Patient Details  Name: Ryan English MRN: [id number] Date of Birth: [birth year] Referring Provider:   Flowsheet Row Cardiac Rehab from 10/19/2023 in [facility] Cardiac and Pulmonary Rehab  Referring Provider Valery Gaucher, MD       Encounter Date: 11/01/2023  Check In:  Session Check In - 11/01/23 0830       Check-In   Supervising physician immediately available to respond to emergencies See telemetry face sheet for immediately available ER MD    Location [facility]-Cardiac & Pulmonary Rehab    Staff Present Maud Sorenson, RN, BSN, CCRP;Noah Tickle, BS, Exercise Physiologist;Margaret Best, MS, Exercise Physiologist    Virtual Visit No    Medication changes reported     No    Fall or balance concerns reported    No    Warm-up and Cool-down Performed on first and last piece of equipment    Resistance Training Performed Yes    VAD Patient? No    PAD/SET Patient? No      Pain Assessment   Currently in Pain? No/denies                Social History   Tobacco Use  Smoking Status Never  Smokeless Tobacco Never    Goals Met:  Independence with exercise equipment Exercise tolerated well No report of concerns or symptoms today  Goals Unmet:  Not Applicable  Comments: Pt able to follow exercise prescription today without complaint.  Will continue to monitor for progression.    Dr. Firman Hughes is Medical Director for [facility] Cardiac Rehabilitation.  Dr. Fuad Aleskerov is Medical Director for [facility] Pulmonary Rehabilitation.

## 2023-11-03 ENCOUNTER — Encounter: Attending: Cardiology | Admitting: *Deleted

## 2023-11-03 DIAGNOSIS — Z955 Presence of coronary angioplasty implant and graft: Secondary | ICD-10-CM | POA: Insufficient documentation

## 2023-11-03 NOTE — Progress Notes (Signed)
 Daily Session Note  Patient Details  Name: Ryan English MRN: [id number] Date of Birth: [birth year] Referring Provider:   Flowsheet Row Cardiac Rehab from 10/19/2023 in [facility] Cardiac and Pulmonary Rehab  Referring Provider Valery Gaucher, MD       Encounter Date: 11/03/2023  Check In:  Session Check In - 11/03/23 0802       Check-In   Supervising physician immediately available to respond to emergencies See telemetry face sheet for immediately available ER MD    Location [facility]-Cardiac & Pulmonary Rehab    Staff Present Maud Sorenson, RN, BSN, CCRP;Joseph Hood RCP,RRT,BSRT;Jason Martina Sledge RDN,LDN    Virtual Visit No    Medication changes reported     No    Fall or balance concerns reported    No    Warm-up and Cool-down Performed on first and last piece of equipment    Resistance Training Performed Yes    VAD Patient? No    PAD/SET Patient? No      Pain Assessment   Currently in Pain? No/denies                Social History   Tobacco Use  Smoking Status Never  Smokeless Tobacco Never    Goals Met:  Independence with exercise equipment Exercise tolerated well No report of concerns or symptoms today  Goals Unmet:  Not Applicable  Comments: Pt able to follow exercise prescription today without complaint.  Will continue to monitor for progression.    Dr. Firman Hughes is Medical Director for [facility] Cardiac Rehabilitation.  Dr. Fuad Aleskerov is Medical Director for [facility] Pulmonary Rehabilitation.

## 2023-11-07 ENCOUNTER — Encounter

## 2023-11-08 ENCOUNTER — Encounter: Admitting: *Deleted

## 2023-11-08 DIAGNOSIS — Z955 Presence of coronary angioplasty implant and graft: Secondary | ICD-10-CM | POA: Diagnosis not present

## 2023-11-08 NOTE — Progress Notes (Signed)
 Daily Session Note  Patient Details  Name: Ryan English MRN: [id number] Date of Birth: [birth year] Referring Provider:   Flowsheet Row Cardiac Rehab from 10/19/2023 in [facility] Cardiac and Pulmonary Rehab  Referring Provider Valery Gaucher, MD       Encounter Date: 11/08/2023  Check In:  Session Check In - 11/08/23 0804       Check-In   Supervising physician immediately available to respond to emergencies See telemetry face sheet for immediately available ER MD    Location [facility]-Cardiac & Pulmonary Rehab    Staff Present Lyell Samuel, MS, Exercise Physiologist;Charlize Hathaway, RN, BSN, CCRP;Noah Tickle, BS, Exercise Physiologist    Virtual Visit No    Medication changes reported     No    Fall or balance concerns reported    No    Warm-up and Cool-down Performed on first and last piece of equipment    Resistance Training Performed Yes    VAD Patient? No    PAD/SET Patient? No      Pain Assessment   Currently in Pain? No/denies                Social History   Tobacco Use  Smoking Status Never  Smokeless Tobacco Never    Goals Met:  Independence with exercise equipment Exercise tolerated well No report of concerns or symptoms today  Goals Unmet:  Not Applicable  Comments: Pt able to follow exercise prescription today without complaint.  Will continue to monitor for progression.    Dr. Firman Hughes is Medical Director for [facility] Cardiac Rehabilitation.  Dr. Fuad Aleskerov is Medical Director for [facility] Pulmonary Rehabilitation.

## 2023-11-09 DIAGNOSIS — Z955 Presence of coronary angioplasty implant and graft: Secondary | ICD-10-CM

## 2023-11-09 NOTE — Progress Notes (Signed)
 30 Day review completed. Medical Director ITP review done, changes made as directed, and signed approval by Medical Director. New to program.

## 2023-11-09 NOTE — Progress Notes (Signed)
 Cardiac Individual Treatment Plan  Patient Details  Name: Ryan English MRN: [id number] Date of Birth: July 17, 1950 Referring Provider:   Flowsheet Row Cardiac Rehab from 10/19/2023 in [facility] Cardiac and Pulmonary Rehab  Referring Provider Valery Gaucher, MD       Initial Encounter Date:  Flowsheet Row Cardiac Rehab from 10/19/2023 in [facility] Cardiac and Pulmonary Rehab  Date 10/19/23       Visit Diagnosis: Status post coronary artery stent placement  Patient's Home Medications on Admission:  Current Outpatient Medications:    acetaminophen  (TYLENOL ) 500 MG tablet, Take 1,000 mg by mouth 2 (two) times daily., Disp: , Rfl:    acetaminophen  (TYLENOL ) 500 MG tablet, Take by mouth., Disp: , Rfl:    albuterol  (VENTOLIN  HFA) 108 (90 Base) MCG/ACT inhaler, TAKE 2 PUFFS BY MOUTH EVERY 6 HOURS AS NEEDED FOR WHEEZE OR SHORTNESS OF BREATH, Disp: 18 each, Rfl: 0   amLODipine  (NORVASC ) 2.5 MG tablet, Take 1 tablet (2.5 mg total) by mouth daily., Disp: 30 tablet, Rfl: 11   aspirin  81 MG tablet, Take 81 mg by mouth daily., Disp: , Rfl:    aspirin  EC 81 MG tablet, Take by mouth., Disp: , Rfl:    atorvastatin (LIPITOR) 40 MG tablet, Take 40 mg by mouth daily., Disp: , Rfl:    Dextromethorphan HBr (DELSYM PO), Take by mouth as needed., Disp: , Rfl:    ezetimibe  (ZETIA ) 10 MG tablet, Take 1 tablet (10 mg total) by mouth daily., Disp: 90 tablet, Rfl: 3   famotidine (PEPCID) 20 MG tablet, Take 20 mg by mouth 2 (two) times daily. Maximum strenght, Disp: , Rfl:    famotidine (PEPCID) 20 MG tablet, Take by mouth., Disp: , Rfl:    ipratropium (ATROVENT  HFA) 17 MCG/ACT inhaler, Inhale into the lungs., Disp: , Rfl:    ipratropium (ATROVENT ) 0.03 % nasal spray, Place 2 sprays into both nostrils every 12 (twelve) hours. (Patient taking differently: Place 2 sprays into both nostrils daily as needed (Allergies).), Disp: 30 mL, Rfl: 12   loratadine  (CLARITIN  REDITABS) 10 MG dissolvable tablet, Dissolve 1 tablet (10 mg  total) in the mouth daily., Disp: , Rfl:    loratadine  (CLARITIN ) 10 MG tablet, Take 1 tablet (10 mg total) by mouth daily., Disp: 30 tablet, Rfl: 11   losartan  (COZAAR ) 50 MG tablet, TAKE 1 TABLET BY MOUTH EVERY DAY, Disp: 90 tablet, Rfl: 1   losartan  (COZAAR ) 50 MG tablet, Take 1 tablet by mouth daily., Disp: , Rfl:    Misc Natural Products (OSTEO BI-FLEX TRIPLE STRENGTH) TABS, Take 2 tablets by mouth daily., Disp: , Rfl:    montelukast  (SINGULAIR ) 10 MG tablet, TAKE 1 TABLET BY MOUTH NIGHTLY, Disp: 90 tablet, Rfl: 1   montelukast  (SINGULAIR ) 10 MG tablet, Take by mouth., Disp: , Rfl:    nitroGLYCERIN  (NITROSTAT ) 0.4 MG SL tablet, Place under the tongue., Disp: , Rfl:    prasugrel (EFFIENT) 10 MG TABS tablet, Take 1 tablet by mouth daily., Disp: , Rfl:    Pseudoephedrine-guaiFENesin (MUCINEX D PO), Take by mouth as needed., Disp: , Rfl:   Past Medical History: Past Medical History:  Diagnosis Date   Anginal pain (HCC)    Arthritis    GERD (gastroesophageal reflux disease)    Hyperlipidemia     Tobacco Use: Social History   Tobacco Use  Smoking Status Never  Smokeless Tobacco Never    Labs: Review Flowsheet  More data exists      Latest Ref Rng & Units 02/07/2019  02/01/2020 02/02/2021 02/10/2022 02/17/2023  Labs for ITP Cardiac and Pulmonary Rehab  Cholestrol 100 - 199 mg/dL 161  096  045  409  811   LDL (calc) 0 - 99 mg/dL 69  63  69  67  61   HDL-C >39 mg/dL 48  47  52  42  44   Trlycerides 0 - 149 mg/dL 72  90  914  782  956   Hemoglobin A1c 4.8 - 5.6 % - - 5.7  5.5  5.5      Exercise Target Goals: Exercise Program Goal: Individual exercise prescription set using results from initial 6 min walk test and THRR while considering  patient's activity barriers and safety.   Exercise Prescription Goal: Initial exercise prescription builds to 30-45 minutes a day of aerobic activity, 2-3 days per week.  Home exercise guidelines will be given to patient during program as part of  exercise prescription that the participant will acknowledge.   Education: Aerobic Exercise: - Group verbal and visual presentation on the components of exercise prescription. Introduces F.I.T.T principle from ACSM for exercise prescriptions.  Reviews F.I.T.T. principles of aerobic exercise including progression. Written material given at graduation.   Education: Resistance Exercise: - Group verbal and visual presentation on the components of exercise prescription. Introduces F.I.T.T principle from ACSM for exercise prescriptions  Reviews F.I.T.T. principles of resistance exercise including progression. Written material given at graduation.    Education: Exercise & Equipment Safety: - Individual verbal instruction and demonstration of equipment use and safety with use of the equipment. Flowsheet Row Cardiac Rehab from 10/27/2023 in [facility] Cardiac and Pulmonary Rehab  Date 10/12/23  Educator jh  Instruction Review Code 1- Verbalizes Understanding       Education: Exercise Physiology & General Exercise Guidelines: - Group verbal and written instruction with models to review the exercise physiology of the cardiovascular system and associated critical values. Provides general exercise guidelines with specific guidelines to those with heart or lung disease.    Education: Flexibility, Balance, Mind/Body Relaxation: - Group verbal and visual presentation with interactive activity on the components of exercise prescription. Introduces F.I.T.T principle from ACSM for exercise prescriptions. Reviews F.I.T.T. principles of flexibility and balance exercise training including progression. Also discusses the mind body connection.  Reviews various relaxation techniques to help reduce and manage stress (i.e. Deep breathing, progressive muscle relaxation, and visualization). Balance handout provided to take home. Written material given at graduation.   Activity Barriers & Risk Stratification:  Activity  Barriers & Cardiac Risk Stratification - 10/19/23 1135       Activity Barriers & Cardiac Risk Stratification   Activity Barriers Other (comment);Neck/Spine Problems;Arthritis    Comments Right and Left knees need to be replaced.    Cardiac Risk Stratification High             6 Minute Walk:  6 Minute Walk     Row Name 10/19/23 1131         6 Minute Walk   Phase Initial     Distance 1135 feet     Walk Time 6 minutes     # of Rest Breaks 0     MPH 2.15     METS 2.42     RPE 11     Perceived Dyspnea  0     VO2 Peak 8.47     Symptoms Yes (comment)     Comments knee soreness     Resting HR 66 bpm     Resting  BP 136/70     Resting Oxygen Saturation  97 %     Exercise Oxygen Saturation  during 6 min walk 97 %     Max Ex. HR 95 bpm     Max Ex. BP 144/72     2 Minute Post BP 128/70              Oxygen Initial Assessment:   Oxygen Re-Evaluation:   Oxygen Discharge (Final Oxygen Re-Evaluation):   Initial Exercise Prescription:  Initial Exercise Prescription - 10/19/23 1100       Date of Initial Exercise RX and Referring Provider   Date 10/19/23    Referring Provider Valery Gaucher, MD      Oxygen   Maintain Oxygen Saturation 88% or higher      Treadmill   MPH 2.2    Grade 0    Minutes 15    METs 2.68      NuStep   Level 3    SPM 80    Minutes 15    METs 2.42      REL-XR   Level 2    Speed 50    Minutes 15    METs 2.42      Prescription Details   Frequency (times per week) 2    Duration Progress to 30 minutes of continuous aerobic without signs/symptoms of physical distress      Intensity   THRR 40-80% of Max Heartrate 98-130    Ratings of Perceived Exertion 11-13    Perceived Dyspnea 0-4      Progression   Progression Continue to progress workloads to maintain intensity without signs/symptoms of physical distress.      Resistance Training   Training Prescription Yes    Weight 10 lb    Reps 10-15             Perform  Capillary Blood Glucose checks as needed.  Exercise Prescription Changes:   Exercise Prescription Changes     Row Name 10/19/23 1100             Response to Exercise   Blood Pressure (Admit) 136/70       Blood Pressure (Exercise) 144/72       Blood Pressure (Exit) 128/70       Heart Rate (Admit) 66 bpm       Heart Rate (Exercise) 95 bpm       Heart Rate (Exit) 58 bpm       Oxygen Saturation (Admit) 97 %       Oxygen Saturation (Exercise) 97 %       Rating of Perceived Exertion (Exercise) 11       Perceived Dyspnea (Exercise) 0       Symptoms knee soreness       Comments Results                Exercise Comments:   Exercise Comments     Row Name 10/24/23 0805           Exercise Comments First full day of exercise!  Patient was oriented to gym and equipment including functions, settings, policies, and procedures.  Patient's individual exercise prescription and treatment plan were reviewed.  All starting workloads were established based on the results of the 6 minute walk test done at initial orientation visit.  The plan for exercise progression was also introduced and progression will be customized based on patient's performance and goals.  Exercise Goals and Review:   Exercise Goals     Row Name 10/19/23 1135             Exercise Goals   Increase Physical Activity Yes       Intervention Provide advice, education, support and counseling about physical activity/exercise needs.;Develop an individualized exercise prescription for aerobic and resistive training based on initial evaluation findings, risk stratification, comorbidities and participant's personal goals.       Expected Outcomes Short Term: Attend rehab on a regular basis to increase amount of physical activity.;Long Term: Add in home exercise to make exercise part of routine and to increase amount of physical activity.;Long Term: Exercising regularly at least 3-5 days a week.        Increase Strength and Stamina Yes       Intervention Provide advice, education, support and counseling about physical activity/exercise needs.;Develop an individualized exercise prescription for aerobic and resistive training based on initial evaluation findings, risk stratification, comorbidities and participant's personal goals.       Expected Outcomes Short Term: Increase workloads from initial exercise prescription for resistance, speed, and METs.;Short Term: Perform resistance training exercises routinely during rehab and add in resistance training at home;Long Term: Improve cardiorespiratory fitness, muscular endurance and strength as measured by increased METs and functional capacity ( )       Able to understand and use rate of perceived exertion (RPE) scale Yes       Intervention Provide education and explanation on how to use RPE scale       Expected Outcomes Short Term: Able to use RPE daily in rehab to express subjective intensity level;Long Term:  Able to use RPE to guide intensity level when exercising independently       Able to understand and use Dyspnea scale Yes       Intervention Provide education and explanation on how to use Dyspnea scale       Expected Outcomes Short Term: Able to use Dyspnea scale daily in rehab to express subjective sense of shortness of breath during exertion;Long Term: Able to use Dyspnea scale to guide intensity level when exercising independently       Knowledge and understanding of Target Heart Rate Range (THRR) Yes       Intervention Provide education and explanation of THRR including how the numbers were predicted and where they are located for reference       Expected Outcomes Long Term: Able to use THRR to govern intensity when exercising independently;Short Term: Able to state/look up THRR;Short Term: Able to use daily as guideline for intensity in rehab       Able to check pulse independently Yes       Intervention Provide education and  demonstration on how to check pulse in carotid and radial arteries.;Review the importance of being able to check your own pulse for safety during independent exercise       Expected Outcomes Short Term: Able to explain why pulse checking is important during independent exercise;Long Term: Able to check pulse independently and accurately       Understanding of Exercise Prescription Yes       Intervention Provide education, explanation, and written materials on patient's individual exercise prescription       Expected Outcomes Short Term: Able to explain program exercise prescription;Long Term: Able to explain home exercise prescription to exercise independently                Exercise Goals Re-Evaluation :  Exercise Goals Re-Evaluation     Row Name 10/24/23 0805             Exercise Goal Re-Evaluation   Exercise Goals Review Able to understand and use rate of perceived exertion (RPE) scale;Knowledge and understanding of Target Heart Rate Range (THRR);Understanding of Exercise Prescription;Able to understand and use Dyspnea scale       Comments Reviewed RPE and dyspnea scale, THR and program prescription with pt today.  Pt voiced understanding and was given a copy of goals to take home.       Expected Outcomes Short: Use RPE daily to regulate intensity. Long: Follow program prescription in THR.                Discharge Exercise Prescription (Final Exercise Prescription Changes):  Exercise Prescription Changes - 10/19/23 1100       Response to Exercise   Blood Pressure (Admit) 136/70    Blood Pressure (Exercise) 144/72    Blood Pressure (Exit) 128/70    Heart Rate (Admit) 66 bpm    Heart Rate (Exercise) 95 bpm    Heart Rate (Exit) 58 bpm    Oxygen Saturation (Admit) 97 %    Oxygen Saturation (Exercise) 97 %    Rating of Perceived Exertion (Exercise) 11    Perceived Dyspnea (Exercise) 0    Symptoms knee soreness    Comments Results             Nutrition:   Target Goals: Understanding of nutrition guidelines, daily intake of sodium 1500mg , cholesterol 200mg , calories 30% from fat and 7% or less from saturated fats, daily to have 5 or more servings of fruits and vegetables.  Education: All About Nutrition: -Group instruction provided by verbal, written material, interactive activities, discussions, models, and posters to present general guidelines for heart healthy nutrition including fat, fiber, MyPlate, the role of sodium in heart healthy nutrition, utilization of the nutrition label, and utilization of this knowledge for meal planning. Follow up email sent as well. Written material given at graduation.   Biometrics:  Pre Biometrics - 10/19/23 1136       Pre Biometrics   Height 5' 10.5" (1.791 m)    Weight 208 lb 4.8 oz (94.5 kg)    Waist Circumference 39 inches    Hip Circumference 40 inches    Waist to Hip Ratio 0.98 %    BMI (Calculated) 29.46    Single Leg Stand 6.55 seconds              Nutrition Therapy Plan and Nutrition Goals:  Nutrition Therapy & Goals - 10/19/23 1130       Nutrition Therapy   RD appointment deferred Yes      Intervention Plan   Intervention Prescribe, educate and counsel regarding individualized specific dietary modifications aiming towards targeted core components such as weight, hypertension, lipid management, diabetes, heart failure and other comorbidities.    Expected Outcomes Short Term Goal: Understand basic principles of dietary content, such as calories, fat, sodium, cholesterol and nutrients.;Short Term Goal: A plan has been developed with personal nutrition goals set during dietitian appointment.;Long Term Goal: Adherence to prescribed nutrition plan.             Nutrition Assessments:  MEDIFICTS Score Key: >=70 Need to make dietary changes  40-70 Heart Healthy Diet <= 40 Therapeutic Level Cholesterol Diet   Picture Your Plate Scores: <19 Unhealthy dietary pattern with much  room for improvement. 41-50 Dietary pattern unlikely to meet  recommendations for good health and room for improvement. 51-60 More healthful dietary pattern, with some room for improvement.  >60 Healthy dietary pattern, although there may be some specific behaviors that could be improved.    Nutrition Goals Re-Evaluation:   Nutrition Goals Discharge (Final Nutrition Goals Re-Evaluation):   Psychosocial: Target Goals: Acknowledge presence or absence of significant depression and/or stress, maximize coping skills, provide positive support system. Participant is able to verbalize types and ability to use techniques and skills needed for reducing stress and depression.   Education: Stress, Anxiety, and Depression - Group verbal and visual presentation to define topics covered.  Reviews how body is impacted by stress, anxiety, and depression.  Also discusses healthy ways to reduce stress and to treat/manage anxiety and depression.  Written material given at graduation.   Education: Sleep Hygiene -Provides group verbal and written instruction about how sleep can affect your health.  Define sleep hygiene, discuss sleep cycles and impact of sleep habits. Review good sleep hygiene tips.    Initial Review & Psychosocial Screening:  Initial Psych Review & Screening - 10/12/23 1426       Initial Review   Current issues with None Identified      Family Dynamics   Good Support System? Yes    Comments He can look to his wife and two sons for support. He states no issues with his mental state and does not take any medication for his mood.      Barriers   Psychosocial barriers to participate in program The patient should benefit from training in stress management and relaxation.;There are no identifiable barriers or psychosocial needs.      Screening Interventions   Interventions Provide feedback about the scores to participant;Program counselor consult;To provide support and resources with  identified psychosocial needs    Expected Outcomes Short Term goal: Utilizing psychosocial counselor, staff and physician to assist with identification of specific Stressors or current issues interfering with healing process. Setting desired goal for each stressor or current issue identified.;Long Term Goal: Stressors or current issues are controlled or eliminated.;Short Term goal: Identification and review with participant of any Quality of Life or Depression concerns found by scoring the questionnaire.;Long Term goal: The participant improves quality of Life and PHQ9 Scores as seen by post scores and/or verbalization of changes             Quality of Life Scores:   Scores of 19 and below usually indicate a poorer quality of life in these areas.  A difference of  2-3 points is a clinically meaningful difference.  A difference of 2-3 points in the total score of the Quality of Life Index has been associated with significant improvement in overall quality of life, self-image, physical symptoms, and general health in studies assessing change in quality of life.  PHQ-9: Review Flowsheet  More data exists      10/19/2023 08/18/2023 02/17/2023 01/19/2023 11/15/2022  Depression screen PHQ 2/9  Decreased Interest 0 0 0 0 0  Down, Depressed, Hopeless 0 0 0 0 0  PHQ - 2 Score 0 0 0 0 0  Altered sleeping 0 - - - 0  Tired, decreased energy 0 - - - 0  Change in appetite 0 - - - 0  Feeling bad or failure about yourself  0 - - - 0  Trouble concentrating 0 - - - 0  Moving slowly or fidgety/restless 0 - - - 0  Suicidal thoughts 0 - - - 0  PHQ-9  Score 0 - - - 0  Difficult doing work/chores - - - - Not difficult at all   Interpretation of Total Score  Total Score Depression Severity:  1-4 = Minimal depression, 5-9 = Mild depression, 10-14 = Moderate depression, 15-19 = Moderately severe depression, 20-27 = Severe depression   Psychosocial Evaluation and Intervention:  Psychosocial Evaluation - 10/12/23  1427       Psychosocial Evaluation & Interventions   Interventions Encouraged to exercise with the program and follow exercise prescription;Relaxation education;Stress management education    Comments He can look to his wife and two sons for support. He states no issues with his mental state and does not take any medication for his mood.    Expected Outcomes Short: Start [facility] to help with mood. Long: Maintain a healthy mental state    Continue Psychosocial Services  Follow up required by staff             Psychosocial Re-Evaluation:   Psychosocial Discharge (Final Psychosocial Re-Evaluation):   Vocational Rehabilitation: Provide vocational rehab assistance to qualifying candidates.   Vocational Rehab Evaluation & Intervention:   Education: Education Goals: Education classes will be provided on a variety of topics geared toward better understanding of heart health and risk factor modification. Participant will state understanding/return demonstration of topics presented as noted by education test scores.  Learning Barriers/Preferences:  Learning Barriers/Preferences - 10/12/23 1426       Learning Barriers/Preferences   Learning Barriers None    Learning Preferences None             General Cardiac Education Topics:  AED/CPR: - Group verbal and written instruction with the use of models to demonstrate the basic use of the AED with the basic ABC's of resuscitation.   Anatomy and Cardiac Procedures: - Group verbal and visual presentation and models provide information about basic cardiac anatomy and function. Reviews the testing methods done to diagnose heart disease and the outcomes of the test results. Describes the treatment choices: Medical Management, Angioplasty, or Coronary Bypass Surgery for treating various heart conditions including Myocardial Infarction, Angina, Valve Disease, and Cardiac Arrhythmias.  Written material given at  graduation.   Medication Safety: - Group verbal and visual instruction to review commonly prescribed medications for heart and lung disease. Reviews the medication, class of the drug, and side effects. Includes the steps to properly store meds and maintain the prescription regimen.  Written material given at graduation.   Intimacy: - Group verbal instruction through game format to discuss how heart and lung disease can affect sexual intimacy. Written material given at graduation..   Know Your Numbers and Heart Failure: - Group verbal and visual instruction to discuss disease risk factors for cardiac and pulmonary disease and treatment options.  Reviews associated critical values for Overweight/Obesity, Hypertension, Cholesterol, and Diabetes.  Discusses basics of heart failure: signs/symptoms and treatments.  Introduces Heart Failure Zone chart for action plan for heart failure.  Written material given at graduation.   Infection Prevention: - Provides verbal and written material to individual with discussion of infection control including proper hand washing and proper equipment cleaning during exercise session. Flowsheet Row Cardiac Rehab from 10/27/2023 in [facility] Cardiac and Pulmonary Rehab  Date 10/12/23  Educator jh  Instruction Review Code 1- Verbalizes Understanding       Falls Prevention: - Provides verbal and written material to individual with discussion of falls prevention and safety. Flowsheet Row Cardiac Rehab from 10/27/2023 in [facility] Cardiac and Pulmonary Rehab  Date 10/12/23  Educator jh  Instruction Review Code 1- Verbalizes Understanding       Other: -Provides group and verbal instruction on various topics (see comments)   Knowledge Questionnaire Score:   Core Components/Risk Factors/Patient Goals at Admission:  Personal Goals and Risk Factors at Admission - 10/12/23 1425       Core Components/Risk Factors/Patient Goals on Admission    Weight Management  Yes;Weight Maintenance;Weight Loss    Intervention Weight Management: Develop a combined nutrition and exercise program designed to reach desired caloric intake, while maintaining appropriate intake of nutrient and fiber, sodium and fats, and appropriate energy expenditure required for the weight goal.;Weight Management: Provide education and appropriate resources to help participant work on and attain dietary goals.;Weight Management/Obesity: Establish reasonable short term and long term weight goals.    Expected Outcomes Short Term: Continue to assess and modify interventions until short term weight is achieved;Weight Maintenance: Understanding of the daily nutrition guidelines, which includes 25-35% calories from fat, 7% or less cal from saturated fats, less than 200mg  cholesterol, less than 1.5gm of sodium, & 5 or more servings of fruits and vegetables daily;Understanding recommendations for meals to include 15-35% energy as protein, 25-35% energy from fat, 35-60% energy from carbohydrates, less than 200mg  of dietary cholesterol, 20-35 gm of total fiber daily;Weight Loss: Understanding of general recommendations for a balanced deficit meal plan, which promotes 1-2 lb weight loss per week and includes a negative energy balance of [phone] kcal/d;Understanding of distribution of calorie intake throughout the day with the consumption of 4-5 meals/snacks    Heart Failure Yes    Intervention Provide a combined exercise and nutrition program that is supplemented with education, support and counseling about heart failure. Directed toward relieving symptoms such as shortness of breath, decreased exercise tolerance, and extremity edema.    Expected Outcomes Improve functional capacity of life;Short term: Attendance in program 2-3 days a week with increased exercise capacity. Reported lower sodium intake. Reported increased fruit and vegetable intake. Reports medication compliance.;Short term: Daily weights obtained  and reported for increase. Utilizing diuretic protocols set by physician.;Long term: Adoption of self-care skills and reduction of barriers for early signs and symptoms recognition and intervention leading to self-care maintenance.    Hypertension Yes    Intervention Provide education on lifestyle modifcations including regular physical activity/exercise, weight management, moderate sodium restriction and increased consumption of fresh fruit, vegetables, and low fat dairy, alcohol moderation, and smoking cessation.;Monitor prescription use compliance.    Expected Outcomes Short Term: Continued assessment and intervention until BP is < 140/47mm HG in hypertensive participants. < 130/98mm HG in hypertensive participants with diabetes, heart failure or chronic kidney disease.;Long Term: Maintenance of blood pressure at goal levels.    Lipids Yes    Intervention Provide education and support for participant on nutrition & aerobic/resistive exercise along with prescribed medications to achieve LDL 70mg , HDL >40mg .    Expected Outcomes Short Term: Participant states understanding of desired cholesterol values and is compliant with medications prescribed. Participant is following exercise prescription and nutrition guidelines.;Long Term: Cholesterol controlled with medications as prescribed, with individualized exercise RX and with personalized nutrition plan. Value goals: LDL < 70mg , HDL > 40 mg.             Education:Diabetes - Individual verbal and written instruction to review signs/symptoms of diabetes, desired ranges of glucose level fasting, after meals and with exercise. Acknowledge that pre and post exercise glucose checks will be done for 3 sessions at entry of  program.   Core Components/Risk Factors/Patient Goals Review:    Core Components/Risk Factors/Patient Goals at Discharge (Final Review):    ITP Comments:  ITP Comments     Row Name 10/12/23 1425 10/19/23 1129 10/24/23 0804  11/09/23 1311     ITP Comments Virtual Visit completed. Patient informed on EP and RD appointment and 6 Minute walk test. Patient also informed of patient health questionnaires on My Chart. Patient Verbalizes understanding. Visit diagnosis can be found in [facility] 09/23/[facility]. Patient is to bring in medication list. Completed and gym orientation for cardiac rehab. Initial ITP created and sent for review to Dr. Firman Hughes, Medical Director. First full day of exercise!  Patient was oriented to gym and equipment including functions, settings, policies, and procedures.  Patient's individual exercise prescription and treatment plan were reviewed.  All starting workloads were established based on the results of the 6 minute walk test done at initial orientation visit.  The plan for exercise progression was also introduced and progression will be customized based on patient's performance and goals. 30 Day review completed. Medical Director ITP review done, changes made as directed, and signed approval by Medical Director. New to program.             Comments: 30 day review

## 2023-11-10 ENCOUNTER — Encounter: Admitting: *Deleted

## 2023-11-10 DIAGNOSIS — Z955 Presence of coronary angioplasty implant and graft: Secondary | ICD-10-CM

## 2023-11-10 NOTE — Progress Notes (Signed)
 Daily Session Note  Patient Details  Name: KAYLOR BORT MRN: [id number] Date of Birth: 06-19-1951 Referring Provider:   Flowsheet Row Cardiac Rehab from 10/19/2023 in [facility] Cardiac and Pulmonary Rehab  Referring Provider Valery Gaucher, MD       Encounter Date: 11/10/2023  Check In:  Session Check In - 11/10/23 0747       Check-In   Supervising physician immediately available to respond to emergencies See telemetry face sheet for immediately available ER MD    Location [facility]-Cardiac & Pulmonary Rehab    Staff Present Maud Sorenson, RN, BSN, CCRP;Joseph Hood RCP,RRT,BSRT;Noah Tickle, [place], Exercise Physiologist;Jason Martina Sledge RDN,LDN    Virtual Visit No    Medication changes reported     No    Fall or balance concerns reported    No    Warm-up and Cool-down Performed on first and last piece of equipment    Resistance Training Performed Yes    VAD Patient? No    PAD/SET Patient? No      Pain Assessment   Currently in Pain? No/denies                Social History   Tobacco Use  Smoking Status Never  Smokeless Tobacco Never    Goals Met:  Independence with exercise equipment Exercise tolerated well No report of concerns or symptoms today  Goals Unmet:  Not Applicable  Comments: Pt able to follow exercise prescription today without complaint.  Will continue to monitor for progression.    Dr. Firman Hughes is Medical Director for [facility] Cardiac Rehabilitation.  Dr. Fuad Aleskerov is Medical Director for [facility] Pulmonary Rehabilitation.

## 2023-11-14 ENCOUNTER — Encounter

## 2023-11-15 ENCOUNTER — Encounter

## 2023-11-17 ENCOUNTER — Encounter

## 2023-11-17 ENCOUNTER — Ambulatory Visit

## 2023-11-17 DIAGNOSIS — Z955 Presence of coronary angioplasty implant and graft: Secondary | ICD-10-CM

## 2023-11-17 NOTE — Progress Notes (Signed)
 Daily Session Note  Patient Details  Name: Ryan English MRN: [id number] Date of Birth: 1950/12/13 Referring Provider:   Flowsheet Row Cardiac Rehab from 10/19/2023 in [facility] Cardiac and Pulmonary Rehab  Referring Provider Valery Gaucher, MD       Encounter Date: 11/17/2023  Check In:  Session Check In - 11/17/23 0939       Check-In   Supervising physician immediately available to respond to emergencies See telemetry face sheet for immediately available ER MD    Location [facility]-Cardiac & Pulmonary Rehab    Staff Present Maud Sorenson, RN, BSN, CCRP;Joseph Hood RCP,RRT,BSRT;Maxon [place] BS, Exercise Physiologist;Margaret Best, MS, Exercise Physiologist    Virtual Visit No    Medication changes reported     No    Fall or balance concerns reported    No    Warm-up and Cool-down Performed on first and last piece of equipment    Resistance Training Performed Yes    VAD Patient? No    PAD/SET Patient? No      Pain Assessment   Currently in Pain? No/denies                Social History   Tobacco Use  Smoking Status Never  Smokeless Tobacco Never    Goals Met:  Independence with exercise equipment Exercise tolerated well No report of concerns or symptoms today  Goals Unmet:  Not Applicable  Comments: Pt able to follow exercise prescription today without complaint.  Will continue to monitor for progression.    Dr. Firman Hughes is Medical Director for [facility] Cardiac Rehabilitation.  Dr. Fuad Aleskerov is Medical Director for [facility] Pulmonary Rehabilitation.

## 2023-11-18 ENCOUNTER — Encounter: Admitting: *Deleted

## 2023-11-18 DIAGNOSIS — Z955 Presence of coronary angioplasty implant and graft: Secondary | ICD-10-CM | POA: Diagnosis not present

## 2023-11-18 NOTE — Progress Notes (Signed)
 Daily Session Note  Patient Details  Name: SEBERT ADDLEMAN MRN: [id number] Date of Birth: 03-08-1951 Referring Provider:   Flowsheet Row Cardiac Rehab from 10/19/2023 in [facility] Cardiac and Pulmonary Rehab  Referring Provider Valery Gaucher, MD       Encounter Date: 11/18/2023  Check In:  Session Check In - 11/18/23 0943       Check-In   Supervising physician immediately available to respond to emergencies See telemetry face sheet for immediately available ER MD    Location [facility]-Cardiac & Pulmonary Rehab    Staff Present Maud Sorenson, RN, BSN, CCRP;Joseph Hood RCP,RRT,BSRT;Maxon [place] BS, Exercise Physiologist;Noah Tickle, BS, Exercise Physiologist    Virtual Visit No    Medication changes reported     No    Fall or balance concerns reported    No    Warm-up and Cool-down Performed on first and last piece of equipment    Resistance Training Performed Yes    VAD Patient? No    PAD/SET Patient? No      Pain Assessment   Currently in Pain? No/denies                Social History   Tobacco Use  Smoking Status Never  Smokeless Tobacco Never    Goals Met:  Independence with exercise equipment Exercise tolerated well No report of concerns or symptoms today  Goals Unmet:  Not Applicable  Comments: Pt able to follow exercise prescription today without complaint.  Will continue to monitor for progression.    Dr. Firman Hughes is Medical Director for [facility] Cardiac Rehabilitation.  Dr. Fuad Aleskerov is Medical Director for [facility] Pulmonary Rehabilitation.

## 2023-11-21 ENCOUNTER — Encounter

## 2023-11-21 ENCOUNTER — Encounter: Admitting: *Deleted

## 2023-11-21 DIAGNOSIS — Z955 Presence of coronary angioplasty implant and graft: Secondary | ICD-10-CM

## 2023-11-21 NOTE — Progress Notes (Signed)
 Daily Session Note  Patient Details  Name: Ryan English MRN: [id number] Date of Birth: Sep 26, 1950 Referring Provider:   Flowsheet Row Cardiac Rehab from 10/19/2023 in [facility] Cardiac and Pulmonary Rehab  Referring Provider Valery Gaucher, MD       Encounter Date: 11/21/2023  Check In:  Session Check In - 11/21/23 0941       Check-In   Supervising physician immediately available to respond to emergencies See telemetry face sheet for immediately available ER MD    Location [facility]-Cardiac & Pulmonary Rehab    Staff Present Maud Sorenson, RN, BSN, CCRP;Maxon [place] BS, Exercise Physiologist;Kelly [employer], ACSM CEP, Exercise Physiologist    Virtual Visit No    Medication changes reported     No    Fall or balance concerns reported    No    Warm-up and Cool-down Performed on first and last piece of equipment    Resistance Training Performed Yes    VAD Patient? No    PAD/SET Patient? No      Pain Assessment   Currently in Pain? No/denies                Social History   Tobacco Use  Smoking Status Never  Smokeless Tobacco Never    Goals Met:  Independence with exercise equipment Exercise tolerated well No report of concerns or symptoms today  Goals Unmet:  Not Applicable  Comments: Pt able to follow exercise prescription today without complaint.  Will continue to monitor for progression.    Dr. Firman Hughes is Medical Director for [facility] Cardiac Rehabilitation.  Dr. Fuad Aleskerov is Medical Director for [facility] Pulmonary Rehabilitation.

## 2023-11-22 ENCOUNTER — Encounter

## 2023-11-24 ENCOUNTER — Encounter

## 2023-11-25 ENCOUNTER — Encounter: Admitting: *Deleted

## 2023-11-25 DIAGNOSIS — Z955 Presence of coronary angioplasty implant and graft: Secondary | ICD-10-CM

## 2023-11-25 NOTE — Progress Notes (Signed)
 Daily Session Note  Patient Details  Name: Ryan English MRN: [id number] Date of Birth: 20-Apr-1951 Referring Provider:   Flowsheet Row Cardiac Rehab from 10/19/2023 in [facility] Cardiac and Pulmonary Rehab  Referring Provider Valery Gaucher, MD       Encounter Date: 11/25/2023  Check In:  Session Check In - 11/25/23 0941       Check-In   Supervising physician immediately available to respond to emergencies See telemetry face sheet for immediately available ER MD    Location [facility]-Cardiac & Pulmonary Rehab    Staff Present Maud Sorenson, RN, BSN, CCRP;Joseph Hood RCP,RRT,BSRT;Noah Tickle, [place], Exercise Physiologist    Virtual Visit No    Medication changes reported     No    Fall or balance concerns reported    No    Warm-up and Cool-down Performed on first and last piece of equipment    Resistance Training Performed Yes    VAD Patient? No    PAD/SET Patient? No      Pain Assessment   Currently in Pain? No/denies                Social History   Tobacco Use  Smoking Status Never  Smokeless Tobacco Never    Goals Met:  Independence with exercise equipment Exercise tolerated well No report of concerns or symptoms today  Goals Unmet:  Not Applicable  Comments: Pt able to follow exercise prescription today without complaint.  Will continue to monitor for progression.    Dr. Firman Hughes is Medical Director for [facility] Cardiac Rehabilitation.  Dr. Fuad Aleskerov is Medical Director for [facility] Pulmonary Rehabilitation.

## 2023-11-29 ENCOUNTER — Encounter

## 2023-11-30 ENCOUNTER — Telehealth: Payer: Self-pay | Admitting: Family Medicine

## 2023-11-30 NOTE — Telephone Encounter (Unsigned)
 Copied from CRM [id number]. Topic: Clinical - Medication Refill >> Nov 30, 2023  4:39 PM Fredrica W wrote: Medication: losartan  (COZAAR ) 50 MG tablet - insurance request 90 day fills   Has the patient contacted their pharmacy? Yes (Agent: If no, request that the patient contact the pharmacy for the refill. If patient does not wish to contact the pharmacy document the reason why and proceed with request.) (Agent: If yes, when and what did the pharmacy advise?) does not have Rx and unable to request in MyChart   This is the patient's preferred pharmacy:  [facility] - [place], [place] - [address] [place] [address] Phone: [phone] Fax: [phone]  Is this the correct pharmacy for this prescription? Yes If no, delete pharmacy and type the correct one.   Has the prescription been filled recently? No  Is the patient out of the medication? No  Has the patient been seen for an appointment in the last year OR does the patient have an upcoming appointment? Yes  Can we respond through MyChart? Yes  Agent: Please be advised that Rx refills may take up to 3 business days. We ask that you follow-up with your pharmacy.

## 2023-12-01 ENCOUNTER — Encounter: Admitting: *Deleted

## 2023-12-01 DIAGNOSIS — Z955 Presence of coronary angioplasty implant and graft: Secondary | ICD-10-CM

## 2023-12-01 NOTE — Progress Notes (Signed)
 Daily Session Note  Patient Details  Name: Ryan English MRN: [id number] Date of Birth: 1950/12/18 Referring Provider:   Flowsheet Row Cardiac Rehab from 10/19/2023 in [facility] Cardiac and Pulmonary Rehab  Referring Provider Valery Gaucher, MD       Encounter Date: 12/01/2023  Check In:  Session Check In - 12/01/23 4098       Check-In   Supervising physician immediately available to respond to emergencies See telemetry face sheet for immediately available ER MD    Location [facility]-Cardiac & Pulmonary Rehab    Staff Present Maud Sorenson, RN, BSN, CCRP;Margaret Best, MS, Exercise Physiologist;Maxon [place] BS, Exercise Physiologist;Joseph Lacinda Pica RCP,RRT,BSRT    Virtual Visit No    Medication changes reported     No    Fall or balance concerns reported    No    Warm-up and Cool-down Performed on first and last piece of equipment    Resistance Training Performed Yes    VAD Patient? No    PAD/SET Patient? No      Pain Assessment   Currently in Pain? No/denies                Social History   Tobacco Use  Smoking Status Never  Smokeless Tobacco Never    Goals Met:  Independence with exercise equipment Exercise tolerated well No report of concerns or symptoms today  Goals Unmet:  Not Applicable  Comments: Pt able to follow exercise prescription today without complaint.  Will continue to monitor for progression.    Dr. Firman Hughes is Medical Director for [facility] Cardiac Rehabilitation.  Dr. Fuad Aleskerov is Medical Director for [facility] Pulmonary Rehabilitation.

## 2023-12-02 ENCOUNTER — Encounter: Admitting: *Deleted

## 2023-12-02 DIAGNOSIS — Z955 Presence of coronary angioplasty implant and graft: Secondary | ICD-10-CM | POA: Diagnosis not present

## 2023-12-02 MED ORDER — LOSARTAN POTASSIUM 50 MG PO TABS: 50.0000 mg | ORAL_TABLET | Freq: Every day | ORAL | 0 refills | Status: DC

## 2023-12-02 NOTE — Telephone Encounter (Signed)
 Requested Prescriptions  Pending Prescriptions Disp Refills   losartan  (COZAAR ) 50 MG tablet 90 tablet 0    Sig: Take 1 tablet (50 mg total) by mouth daily.     Cardiovascular:  Angiotensin Receptor Blockers Passed - 12/02/2023  2:59 PM      Passed - Cr in normal range and within 180 days    Creatinine, Ser  Date Value Ref Range Status  06/28/2023 0.91 0.76 - 1.27 mg/dL Final         Passed - K in normal range and within 180 days    Potassium  Date Value Ref Range Status  06/28/2023 4.7 3.5 - 5.2 mmol/L Final         Passed - Patient is not pregnant      Passed - Last BP in normal range    BP Readings from Last 1 Encounters:  08/18/23 113/81         Passed - Valid encounter within last 6 months    Recent Outpatient Visits           3 months ago Primary hypertension   [facility] [place], Stan Eans, MD       Future Appointments             In 2 months Bacigalupo, Stan Eans, MD [facility]

## 2023-12-02 NOTE — Progress Notes (Signed)
 Daily Session Note  Patient Details  Name: NICKLAUS ALVIAR MRN: [id number] Date of Birth: [birth year] Referring Provider:   Flowsheet Row Cardiac Rehab from 10/19/2023 in [facility] Cardiac and Pulmonary Rehab  Referring Provider Valery Gaucher, MD       Encounter Date: 12/02/2023  Check In:  Session Check In - 12/02/23 0935       Check-In   Supervising physician immediately available to respond to emergencies See telemetry face sheet for immediately available ER MD    Location [facility]-Cardiac & Pulmonary Rehab    Staff Present Maud Sorenson, RN, BSN, CCRP;Joseph Hood RCP,RRT,BSRT;Maxon [place] BS, Exercise Physiologist;Noah Tickle, BS, Exercise Physiologist    Virtual Visit No    Medication changes reported     No    Fall or balance concerns reported    No    Warm-up and Cool-down Performed on first and last piece of equipment    Resistance Training Performed Yes    VAD Patient? No    PAD/SET Patient? No      Pain Assessment   Currently in Pain? No/denies                Social History   Tobacco Use  Smoking Status Never  Smokeless Tobacco Never    Goals Met:  Independence with exercise equipment Exercise tolerated well No report of concerns or symptoms today  Goals Unmet:  Not Applicable  Comments: Pt able to follow exercise prescription today without complaint.  Will continue to monitor for progression.    Dr. Firman Hughes is Medical Director for [facility] Cardiac Rehabilitation.  Dr. Fuad Aleskerov is Medical Director for [facility] Pulmonary Rehabilitation.

## 2023-12-05 ENCOUNTER — Encounter: Attending: Family Medicine

## 2023-12-05 ENCOUNTER — Encounter

## 2023-12-05 DIAGNOSIS — Z955 Presence of coronary angioplasty implant and graft: Secondary | ICD-10-CM | POA: Insufficient documentation

## 2023-12-05 NOTE — Progress Notes (Signed)
 Daily Session Note  Patient Details  Name: Ryan English MRN: [id number] Date of Birth: 1950-11-25 Referring Provider:   Flowsheet Row Cardiac Rehab from 10/19/2023 in [facility] Cardiac and Pulmonary Rehab  Referring Provider Valery Gaucher, MD       Encounter Date: 12/05/2023  Check In:  Session Check In - 12/05/23 0942       Check-In   Supervising physician immediately available to respond to emergencies See telemetry face sheet for immediately available ER MD    Location [facility]-Cardiac & Pulmonary Rehab    Staff Present Freddrick Jaffe BS, ACSM CEP, Exercise Physiologist;Jasminne Mealy RN,BSN,MPA;Joseph Hood RCP,RRT,BSRT;Margaret Best, MS, Exercise Physiologist    Virtual Visit No    Medication changes reported     No    Fall or balance concerns reported    No    Warm-up and Cool-down Performed on first and last piece of equipment    Resistance Training Performed Yes    VAD Patient? No    PAD/SET Patient? No      Pain Assessment   Currently in Pain? No/denies                Social History   Tobacco Use  Smoking Status Never  Smokeless Tobacco Never    Goals Met:  Independence with exercise equipment Exercise tolerated well No report of concerns or symptoms today Strength training completed today  Goals Unmet:  Not Applicable  Comments: Pt able to follow exercise prescription today without complaint.  Will continue to monitor for progression.    Dr. Firman Hughes is Medical Director for [facility] Cardiac Rehabilitation.  Dr. Fuad Aleskerov is Medical Director for [facility] Pulmonary Rehabilitation.

## 2023-12-06 ENCOUNTER — Encounter

## 2023-12-07 ENCOUNTER — Encounter: Payer: Self-pay | Admitting: *Deleted

## 2023-12-07 DIAGNOSIS — Z955 Presence of coronary angioplasty implant and graft: Secondary | ICD-10-CM

## 2023-12-07 NOTE — Progress Notes (Signed)
 Cardiac Individual Treatment Plan  Patient Details  Name: Ryan English MRN: [id number] Date of Birth: [birth year] Referring Provider:   Flowsheet Row Cardiac Rehab from 10/19/2023 in [facility] Cardiac and Pulmonary Rehab  Referring Provider Valery Gaucher, MD       Initial Encounter Date:  Flowsheet Row Cardiac Rehab from 10/19/2023 in [facility] Cardiac and Pulmonary Rehab  Date 10/19/23       Visit Diagnosis: Status post coronary artery stent placement  Patient's Home Medications on Admission:  Current Outpatient Medications:    acetaminophen  (TYLENOL ) 500 MG tablet, Take 1,000 mg by mouth 2 (two) times daily., Disp: , Rfl:    acetaminophen  (TYLENOL ) 500 MG tablet, Take by mouth., Disp: , Rfl:    albuterol  (VENTOLIN  HFA) 108 (90 Base) MCG/ACT inhaler, TAKE 2 PUFFS BY MOUTH EVERY 6 HOURS AS NEEDED FOR WHEEZE OR SHORTNESS OF BREATH, Disp: 18 each, Rfl: 0   amLODipine  (NORVASC ) 2.5 MG tablet, Take 1 tablet (2.5 mg total) by mouth daily., Disp: 30 tablet, Rfl: 11   aspirin  81 MG tablet, Take 81 mg by mouth daily., Disp: , Rfl:    aspirin  EC 81 MG tablet, Take by mouth., Disp: , Rfl:    atorvastatin (LIPITOR) 40 MG tablet, Take 40 mg by mouth daily., Disp: , Rfl:    Dextromethorphan HBr (DELSYM PO), Take by mouth as needed., Disp: , Rfl:    ezetimibe  (ZETIA ) 10 MG tablet, Take 1 tablet (10 mg total) by mouth daily., Disp: 90 tablet, Rfl: 3   famotidine (PEPCID) 20 MG tablet, Take 20 mg by mouth 2 (two) times daily. Maximum strenght, Disp: , Rfl:    famotidine (PEPCID) 20 MG tablet, Take by mouth., Disp: , Rfl:    ipratropium (ATROVENT  HFA) 17 MCG/ACT inhaler, Inhale into the lungs., Disp: , Rfl:    ipratropium (ATROVENT ) 0.03 % nasal spray, Place 2 sprays into both nostrils every 12 (twelve) hours. (Patient taking differently: Place 2 sprays into both nostrils daily as needed (Allergies).), Disp: 30 mL, Rfl: 12   loratadine  (CLARITIN  REDITABS) 10 MG dissolvable tablet, Dissolve 1 tablet (10 mg  total) in the mouth daily., Disp: , Rfl:    loratadine  (CLARITIN ) 10 MG tablet, Take 1 tablet (10 mg total) by mouth daily., Disp: 30 tablet, Rfl: 11   losartan  (COZAAR ) 50 MG tablet, Take 1 tablet by mouth daily., Disp: , Rfl:    losartan  (COZAAR ) 50 MG tablet, Take 1 tablet (50 mg total) by mouth daily., Disp: 90 tablet, Rfl: 0   Misc Natural Products (OSTEO BI-FLEX TRIPLE STRENGTH) TABS, Take 2 tablets by mouth daily., Disp: , Rfl:    montelukast  (SINGULAIR ) 10 MG tablet, TAKE 1 TABLET BY MOUTH NIGHTLY, Disp: 90 tablet, Rfl: 1   montelukast  (SINGULAIR ) 10 MG tablet, Take by mouth., Disp: , Rfl:    nitroGLYCERIN  (NITROSTAT ) 0.4 MG SL tablet, Place under the tongue., Disp: , Rfl:    prasugrel (EFFIENT) 10 MG TABS tablet, Take 1 tablet by mouth daily., Disp: , Rfl:    Pseudoephedrine-guaiFENesin (MUCINEX D PO), Take by mouth as needed., Disp: , Rfl:   Past Medical History: Past Medical History:  Diagnosis Date   Anginal pain (HCC)    Arthritis    GERD (gastroesophageal reflux disease)    Hyperlipidemia     Tobacco Use: Social History   Tobacco Use  Smoking Status Never  Smokeless Tobacco Never    Labs: Review Flowsheet  More data exists      Latest Ref Rng &  Units 02/07/2019 02/01/2020 02/02/2021 02/10/2022 02/17/2023  Labs for ITP Cardiac and Pulmonary Rehab  Cholestrol 100 - 199 mg/dL 562  130  865  784  696   LDL (calc) 0 - 99 mg/dL 69  63  69  67  61   HDL-C >39 mg/dL 48  47  52  42  44   Trlycerides 0 - 149 mg/dL 72  90  295  284  132   Hemoglobin A1c 4.8 - 5.6 % - - 5.7  5.5  5.5      Exercise Target Goals: Exercise Program Goal: Individual exercise prescription set using results from initial 6 min walk test and THRR while considering  patient's activity barriers and safety.   Exercise Prescription Goal: Initial exercise prescription builds to 30-45 minutes a day of aerobic activity, 2-3 days per week.  Home exercise guidelines will be given to patient during program as  part of exercise prescription that the participant will acknowledge.   Education: Aerobic Exercise: - Group verbal and visual presentation on the components of exercise prescription. Introduces F.I.T.T principle from ACSM for exercise prescriptions.  Reviews F.I.T.T. principles of aerobic exercise including progression. Written material given at graduation. Flowsheet Row Cardiac Rehab from 12/01/2023 in [facility] Cardiac and Pulmonary Rehab  Date 12/01/23  Educator MB  Instruction Review Code 1- Verbalizes Understanding       Education: Resistance Exercise: - Group verbal and visual presentation on the components of exercise prescription. Introduces F.I.T.T principle from ACSM for exercise prescriptions  Reviews F.I.T.T. principles of resistance exercise including progression. Written material given at graduation.    Education: Exercise & Equipment Safety: - Individual verbal instruction and demonstration of equipment use and safety with use of the equipment. Flowsheet Row Cardiac Rehab from 12/01/2023 in [facility] Cardiac and Pulmonary Rehab  Date 10/12/23  Educator jh  Instruction Review Code 1- Verbalizes Understanding       Education: Exercise Physiology & General Exercise Guidelines: - Group verbal and written instruction with models to review the exercise physiology of the cardiovascular system and associated critical values. Provides general exercise guidelines with specific guidelines to those with heart or lung disease.  Flowsheet Row Cardiac Rehab from 12/01/2023 in [facility] Cardiac and Pulmonary Rehab  Date 11/17/23  Educator MB  Instruction Review Code 1- [employer] Understanding       Education: Flexibility, Balance, Mind/Body Relaxation: - Group verbal and visual presentation with interactive activity on the components of exercise prescription. Introduces F.I.T.T principle from ACSM for exercise prescriptions. Reviews F.I.T.T. principles of flexibility and balance exercise training  including progression. Also discusses the mind body connection.  Reviews various relaxation techniques to help reduce and manage stress (i.e. Deep breathing, progressive muscle relaxation, and visualization). Balance handout provided to take home. Written material given at graduation.   Activity Barriers & Risk Stratification:  Activity Barriers & Cardiac Risk Stratification - 10/19/23 1135       Activity Barriers & Cardiac Risk Stratification   Activity Barriers Other (comment);Neck/Spine Problems;Arthritis    Comments Right and Left knees need to be replaced.    Cardiac Risk Stratification High             6 Minute Walk:  6 Minute Walk     Row Name 10/19/23 1131         6 Minute Walk   Phase Initial     Distance 1135 feet     Walk Time 6 minutes     # of Rest Breaks 0  MPH 2.15     METS 2.42     RPE 11     Perceived Dyspnea  0     VO2 Peak 8.47     Symptoms Yes (comment)     Comments knee soreness     Resting HR 66 bpm     Resting BP 136/70     Resting Oxygen Saturation  97 %     Exercise Oxygen Saturation  during 6 min walk 97 %     Max Ex. HR 95 bpm     Max Ex. BP 144/72     2 Minute Post BP 128/70              Oxygen Initial Assessment:   Oxygen Re-Evaluation:   Oxygen Discharge (Final Oxygen Re-Evaluation):   Initial Exercise Prescription:  Initial Exercise Prescription - 10/19/23 1100       Date of Initial Exercise RX and Referring Provider   Date 10/19/23    Referring Provider Valery Gaucher, MD      Oxygen   Maintain Oxygen Saturation 88% or higher      Treadmill   MPH 2.2    Grade 0    Minutes 15    METs 2.68      NuStep   Level 3    SPM 80    Minutes 15    METs 2.42      REL-XR   Level 2    Speed 50    Minutes 15    METs 2.42      Prescription Details   Frequency (times per week) 2    Duration Progress to 30 minutes of continuous aerobic without signs/symptoms of physical distress      Intensity   THRR 40-80% of  Max Heartrate 98-130    Ratings of Perceived Exertion 11-13    Perceived Dyspnea 0-4      Progression   Progression Continue to progress workloads to maintain intensity without signs/symptoms of physical distress.      Resistance Training   Training Prescription Yes    Weight 10 lb    Reps 10-15             Perform Capillary Blood Glucose checks as needed.  Exercise Prescription Changes:   Exercise Prescription Changes     Row Name 10/19/23 1100 11/10/23 1400 11/24/23 1700         Response to Exercise   Blood Pressure (Admit) 136/70 98/62 98/60      Blood Pressure (Exercise) 144/72 132/60 134/60     Blood Pressure (Exit) 128/70 100/60 102/80     Heart Rate (Admit) 66 bpm 81 bpm 75 bpm     Heart Rate (Exercise) 95 bpm 110 bpm 113 bpm     Heart Rate (Exit) 58 bpm 81 bpm 69 bpm     Oxygen Saturation (Admit) 97 % -- --     Oxygen Saturation (Exercise) 97 % -- --     Rating of Perceived Exertion (Exercise) 11 13 15      Perceived Dyspnea (Exercise) 0 -- --     Symptoms knee soreness none none     Comments Results First two weeks of exercise --     Duration -- Continue with 30 min of aerobic exercise without signs/symptoms of physical distress. Continue with 30 min of aerobic exercise without signs/symptoms of physical distress.     Intensity -- THRR unchanged THRR unchanged       Progression   Progression --  Continue to progress workloads to maintain intensity without signs/symptoms of physical distress. Continue to progress workloads to maintain intensity without signs/symptoms of physical distress.     Average METs -- 3.22 4.25       Resistance Training   Training Prescription -- Yes Yes     Weight -- 10 lb 10 lb     Reps -- 10-15 10-15       Interval Training   Interval Training -- No No       Treadmill   MPH -- 2.5 2.5     Grade -- 1 1.5     Minutes -- 15 15     METs -- 3.26 3.43       NuStep   Level -- 6 --     Minutes -- 15 --     METs -- 5.5 --        REL-XR   Level -- 2 4     Minutes -- 15 15     METs -- -- 9       T5 Nustep   Level -- 5 6     Minutes -- 15 15     METs -- 3 3.9       Oxygen   Maintain Oxygen Saturation -- 88% or higher 88% or higher              Exercise Comments:   Exercise Comments     Row Name 10/24/23 0805           Exercise Comments First full day of exercise!  Patient was oriented to gym and equipment including functions, settings, policies, and procedures.  Patient's individual exercise prescription and treatment plan were reviewed.  All starting workloads were established based on the results of the 6 minute walk test done at initial orientation visit.  The plan for exercise progression was also introduced and progression will be customized based on patient's performance and goals.                Exercise Goals and Review:   Exercise Goals     Row Name 10/19/23 1135             Exercise Goals   Increase Physical Activity Yes       Intervention Provide advice, education, support and counseling about physical activity/exercise needs.;Develop an individualized exercise prescription for aerobic and resistive training based on initial evaluation findings, risk stratification, comorbidities and participant's personal goals.       Expected Outcomes Short Term: Attend rehab on a regular basis to increase amount of physical activity.;Long Term: Add in home exercise to make exercise part of routine and to increase amount of physical activity.;Long Term: Exercising regularly at least 3-5 days a week.       Increase Strength and Stamina Yes       Intervention Provide advice, education, support and counseling about physical activity/exercise needs.;Develop an individualized exercise prescription for aerobic and resistive training based on initial evaluation findings, risk stratification, comorbidities and participant's personal goals.       Expected Outcomes Short Term: Increase workloads from  initial exercise prescription for resistance, speed, and METs.;Short Term: Perform resistance training exercises routinely during rehab and add in resistance training at home;Long Term: Improve cardiorespiratory fitness, muscular endurance and strength as measured by increased METs and functional capacity ( )       Able to understand and use rate of perceived exertion (RPE) scale Yes  Intervention Provide education and explanation on how to use RPE scale       Expected Outcomes Short Term: Able to use RPE daily in rehab to express subjective intensity level;Long Term:  Able to use RPE to guide intensity level when exercising independently       Able to understand and use Dyspnea scale Yes       Intervention Provide education and explanation on how to use Dyspnea scale       Expected Outcomes Short Term: Able to use Dyspnea scale daily in rehab to express subjective sense of shortness of breath during exertion;Long Term: Able to use Dyspnea scale to guide intensity level when exercising independently       Knowledge and understanding of Target Heart Rate Range (THRR) Yes       Intervention Provide education and explanation of THRR including how the numbers were predicted and where they are located for reference       Expected Outcomes Long Term: Able to use THRR to govern intensity when exercising independently;Short Term: Able to state/look up THRR;Short Term: Able to use daily as guideline for intensity in rehab       Able to check pulse independently Yes       Intervention Provide education and demonstration on how to check pulse in carotid and radial arteries.;Review the importance of being able to check your own pulse for safety during independent exercise       Expected Outcomes Short Term: Able to explain why pulse checking is important during independent exercise;Long Term: Able to check pulse independently and accurately       Understanding of Exercise Prescription Yes        Intervention Provide education, explanation, and written materials on patient's individual exercise prescription       Expected Outcomes Short Term: Able to explain program exercise prescription;Long Term: Able to explain home exercise prescription to exercise independently                Exercise Goals Re-Evaluation :  Exercise Goals Re-Evaluation     Row Name 10/24/23 0805 11/10/23 1434 11/24/23 1759         Exercise Goal Re-Evaluation   Exercise Goals Review Able to understand and use rate of perceived exertion (RPE) scale;Knowledge and understanding of Target Heart Rate Range (THRR);Understanding of Exercise Prescription;Able to understand and use Dyspnea scale Increase Strength and Stamina;Increase Physical Activity;Understanding of Exercise Prescription Increase Strength and Stamina;Increase Physical Activity;Understanding of Exercise Prescription     Comments Reviewed RPE and dyspnea scale, THR and program prescription with pt today.  Pt voiced understanding and was given a copy of goals to take home. Ryan English is off to a good start in the program. He did well on the treadmill at a speed of 2.5 mph with an incline of 1%. He also improved to level 6 on the T4 nustep and level 5 on the T5 nustep. We will continue to monitor his progress in the program. Ryan English is doing well in rehab. He increased his workload on the treadmill to a speed of 2.5 mph and incline of 1.5%. He also increased to level 4 on the XR and level 6 on the T5 nustep. We will continue to monitor his progress in the program.     Expected Outcomes Short: Use RPE daily to regulate intensity. Long: Follow program prescription in THR. Short: Continue to follow current exercise prescription. Long: Continue exercise to improve strength and stamina. Short: Continue to progressively increase  treadmill and XR workloads. Long: Continue exercise to improve strength and stamina.              Discharge Exercise Prescription  (Final Exercise Prescription Changes):  Exercise Prescription Changes - 11/24/23 1700       Response to Exercise   Blood Pressure (Admit) 98/60    Blood Pressure (Exercise) 134/60    Blood Pressure (Exit) 102/80    Heart Rate (Admit) 75 bpm    Heart Rate (Exercise) 113 bpm    Heart Rate (Exit) 69 bpm    Rating of Perceived Exertion (Exercise) 15    Symptoms none    Duration Continue with 30 min of aerobic exercise without signs/symptoms of physical distress.    Intensity THRR unchanged      Progression   Progression Continue to progress workloads to maintain intensity without signs/symptoms of physical distress.    Average METs 4.25      Resistance Training   Training Prescription Yes    Weight 10 lb    Reps 10-15      Interval Training   Interval Training No      Treadmill   MPH 2.5    Grade 1.5    Minutes 15    METs 3.43      REL-XR   Level 4    Minutes 15    METs 9      T5 Nustep   Level 6    Minutes 15    METs 3.9      Oxygen   Maintain Oxygen Saturation 88% or higher             Nutrition:  Target Goals: Understanding of nutrition guidelines, daily intake of sodium 1500mg , cholesterol 200mg , calories 30% from fat and 7% or less from saturated fats, daily to have 5 or more servings of fruits and vegetables.  Education: All About Nutrition: -Group instruction provided by verbal, written material, interactive activities, discussions, models, and posters to present general guidelines for heart healthy nutrition including fat, fiber, MyPlate, the role of sodium in heart healthy nutrition, utilization of the nutrition label, and utilization of this knowledge for meal planning. Follow up email sent as well. Written material given at graduation.   Biometrics:  Pre Biometrics - 10/19/23 1136       Pre Biometrics   Height 5' 10.5" (1.791 m)    Weight 208 lb 4.8 oz (94.5 kg)    Waist Circumference 39 inches    Hip Circumference 40 inches    Waist to  Hip Ratio 0.98 %    BMI (Calculated) 29.46    Single Leg Stand 6.55 seconds              Nutrition Therapy Plan and Nutrition Goals:  Nutrition Therapy & Goals - 10/19/23 1130       Nutrition Therapy   RD appointment deferred Yes      Intervention Plan   Intervention Prescribe, educate and counsel regarding individualized specific dietary modifications aiming towards targeted core components such as weight, hypertension, lipid management, diabetes, heart failure and other comorbidities.    Expected Outcomes Short Term Goal: Understand basic principles of dietary content, such as calories, fat, sodium, cholesterol and nutrients.;Short Term Goal: A plan has been developed with personal nutrition goals set during dietitian appointment.;Long Term Goal: Adherence to prescribed nutrition plan.             Nutrition Assessments:  MEDIFICTS Score Key: >=70 Need to make dietary changes  40-70 Heart Healthy Diet <= 40 Therapeutic Level Cholesterol Diet   Picture Your Plate Scores: <16 Unhealthy dietary pattern with much room for improvement. 41-50 Dietary pattern unlikely to meet recommendations for good health and room for improvement. 51-60 More healthful dietary pattern, with some room for improvement.  >60 Healthy dietary pattern, although there may be some specific behaviors that could be improved.    Nutrition Goals Re-Evaluation:   Nutrition Goals Discharge (Final Nutrition Goals Re-Evaluation):   Psychosocial: Target Goals: Acknowledge presence or absence of significant depression and/or stress, maximize coping skills, provide positive support system. Participant is able to verbalize types and ability to use techniques and skills needed for reducing stress and depression.   Education: Stress, Anxiety, and Depression - Group verbal and visual presentation to define topics covered.  Reviews how body is impacted by stress, anxiety, and depression.  Also discusses  healthy ways to reduce stress and to treat/manage anxiety and depression.  Written material given at graduation. Flowsheet Row Cardiac Rehab from 12/01/2023 in [facility] Cardiac and Pulmonary Rehab  Date 11/10/23  Educator SB  Instruction Review Code 1- [employer] Understanding       Education: Sleep Hygiene -Provides group verbal and written instruction about how sleep can affect your health.  Define sleep hygiene, discuss sleep cycles and impact of sleep habits. Review good sleep hygiene tips.    Initial Review & Psychosocial Screening:  Initial Psych Review & Screening - 10/12/23 1426       Initial Review   Current issues with None Identified      Family Dynamics   Good Support System? Yes    Comments He can look to his wife and two sons for support. He states no issues with his mental state and does not take any medication for his mood.      Barriers   Psychosocial barriers to participate in program The patient should benefit from training in stress management and relaxation.;There are no identifiable barriers or psychosocial needs.      Screening Interventions   Interventions Provide feedback about the scores to participant;Program counselor consult;To provide support and resources with identified psychosocial needs    Expected Outcomes Short Term goal: Utilizing psychosocial counselor, staff and physician to assist with identification of specific Stressors or current issues interfering with healing process. Setting desired goal for each stressor or current issue identified.;Long Term Goal: Stressors or current issues are controlled or eliminated.;Short Term goal: Identification and review with participant of any Quality of Life or Depression concerns found by scoring the questionnaire.;Long Term goal: The participant improves quality of Life and PHQ9 Scores as seen by post scores and/or verbalization of changes             Quality of Life Scores:   Scores of 19 and below  usually indicate a poorer quality of life in these areas.  A difference of  2-3 points is a clinically meaningful difference.  A difference of 2-3 points in the total score of the Quality of Life Index has been associated with significant improvement in overall quality of life, self-image, physical symptoms, and general health in studies assessing change in quality of life.  PHQ-9: Review Flowsheet  More data exists      10/19/2023 08/18/2023 02/17/2023 01/19/2023 11/15/2022  Depression screen PHQ 2/9  Decreased Interest 0 0 0 0 0  Down, Depressed, Hopeless 0 0 0 0 0  PHQ - 2 Score 0 0 0 0 0  Altered sleeping 0 - - -  0  Tired, decreased energy 0 - - - 0  Change in appetite 0 - - - 0  Feeling bad or failure about yourself  0 - - - 0  Trouble concentrating 0 - - - 0  Moving slowly or fidgety/restless 0 - - - 0  Suicidal thoughts 0 - - - 0  PHQ-9 Score 0 - - - 0  Difficult doing work/chores - - - - Not difficult at all   Interpretation of Total Score  Total Score Depression Severity:  1-4 = Minimal depression, 5-9 = Mild depression, 10-14 = Moderate depression, 15-19 = Moderately severe depression, 20-27 = Severe depression   Psychosocial Evaluation and Intervention:  Psychosocial Evaluation - 10/12/23 1427       Psychosocial Evaluation & Interventions   Interventions Encouraged to exercise with the program and follow exercise prescription;Relaxation education;Stress management education    Comments He can look to his wife and two sons for support. He states no issues with his mental state and does not take any medication for his mood.    Expected Outcomes Short: Start [facility] to help with mood. Long: Maintain a healthy mental state    Continue Psychosocial Services  Follow up required by staff             Psychosocial Re-Evaluation:   Psychosocial Discharge (Final Psychosocial Re-Evaluation):   Vocational Rehabilitation: Provide vocational rehab assistance to qualifying  candidates.   Vocational Rehab Evaluation & Intervention:   Education: Education Goals: Education classes will be provided on a variety of topics geared toward better understanding of heart health and risk factor modification. Participant will state understanding/return demonstration of topics presented as noted by education test scores.  Learning Barriers/Preferences:  Learning Barriers/Preferences - 10/12/23 1426       Learning Barriers/Preferences   Learning Barriers None    Learning Preferences None             General Cardiac Education Topics:  AED/CPR: - Group verbal and written instruction with the use of models to demonstrate the basic use of the AED with the basic ABC's of resuscitation.   Anatomy and Cardiac Procedures: - Group verbal and visual presentation and models provide information about basic cardiac anatomy and function. Reviews the testing methods done to diagnose heart disease and the outcomes of the test results. Describes the treatment choices: Medical Management, Angioplasty, or Coronary Bypass Surgery for treating various heart conditions including Myocardial Infarction, Angina, Valve Disease, and Cardiac Arrhythmias.  Written material given at graduation.   Medication Safety: - Group verbal and visual instruction to review commonly prescribed medications for heart and lung disease. Reviews the medication, class of the drug, and side effects. Includes the steps to properly store meds and maintain the prescription regimen.  Written material given at graduation.   Intimacy: - Group verbal instruction through game format to discuss how heart and lung disease can affect sexual intimacy. Written material given at graduation.. Flowsheet Row Cardiac Rehab from 12/01/2023 in [facility] Cardiac and Pulmonary Rehab  Date 12/01/23  Educator MB  Instruction Review Code 1- Verbalizes Understanding       Know Your Numbers and Heart Failure: - Group verbal and visual  instruction to discuss disease risk factors for cardiac and pulmonary disease and treatment options.  Reviews associated critical values for Overweight/Obesity, Hypertension, Cholesterol, and Diabetes.  Discusses basics of heart failure: signs/symptoms and treatments.  Introduces Heart Failure Zone chart for action plan for heart failure.  Written material given at graduation.  Infection Prevention: - Provides verbal and written material to individual with discussion of infection control including proper hand washing and proper equipment cleaning during exercise session. Flowsheet Row Cardiac Rehab from 12/01/2023 in [facility] Cardiac and Pulmonary Rehab  Date 10/12/23  Educator jh  Instruction Review Code 1- Verbalizes Understanding       Falls Prevention: - Provides verbal and written material to individual with discussion of falls prevention and safety. Flowsheet Row Cardiac Rehab from 12/01/2023 in [facility] Cardiac and Pulmonary Rehab  Date 10/12/23  Educator jh  Instruction Review Code 1- Verbalizes Understanding       Other: -Provides group and verbal instruction on various topics (see comments)   Knowledge Questionnaire Score:   Core Components/Risk Factors/Patient Goals at Admission:  Personal Goals and Risk Factors at Admission - 10/12/23 1425       Core Components/Risk Factors/Patient Goals on Admission    Weight Management Yes;Weight Maintenance;Weight Loss    Intervention Weight Management: Develop a combined nutrition and exercise program designed to reach desired caloric intake, while maintaining appropriate intake of nutrient and fiber, sodium and fats, and appropriate energy expenditure required for the weight goal.;Weight Management: Provide education and appropriate resources to help participant work on and attain dietary goals.;Weight Management/Obesity: Establish reasonable short term and long term weight goals.    Expected Outcomes Short Term: Continue to assess and  modify interventions until short term weight is achieved;Weight Maintenance: Understanding of the daily nutrition guidelines, which includes 25-35% calories from fat, 7% or less cal from saturated fats, less than 200mg  cholesterol, less than 1.5gm of sodium, & 5 or more servings of fruits and vegetables daily;Understanding recommendations for meals to include 15-35% energy as protein, 25-35% energy from fat, 35-60% energy from carbohydrates, less than 200mg  of dietary cholesterol, 20-35 gm of total fiber daily;Weight Loss: Understanding of general recommendations for a balanced deficit meal plan, which promotes 1-2 lb weight loss per week and includes a negative energy balance of [phone] kcal/d;Understanding of distribution of calorie intake throughout the day with the consumption of 4-5 meals/snacks    Heart Failure Yes    Intervention Provide a combined exercise and nutrition program that is supplemented with education, support and counseling about heart failure. Directed toward relieving symptoms such as shortness of breath, decreased exercise tolerance, and extremity edema.    Expected Outcomes Improve functional capacity of life;Short term: Attendance in program 2-3 days a week with increased exercise capacity. Reported lower sodium intake. Reported increased fruit and vegetable intake. Reports medication compliance.;Short term: Daily weights obtained and reported for increase. Utilizing diuretic protocols set by physician.;Long term: Adoption of self-care skills and reduction of barriers for early signs and symptoms recognition and intervention leading to self-care maintenance.    Hypertension Yes    Intervention Provide education on lifestyle modifcations including regular physical activity/exercise, weight management, moderate sodium restriction and increased consumption of fresh fruit, vegetables, and low fat dairy, alcohol moderation, and smoking cessation.;Monitor prescription use compliance.     Expected Outcomes Short Term: Continued assessment and intervention until BP is < 140/55mm HG in hypertensive participants. < 130/59mm HG in hypertensive participants with diabetes, heart failure or chronic kidney disease.;Long Term: Maintenance of blood pressure at goal levels.    Lipids Yes    Intervention Provide education and support for participant on nutrition & aerobic/resistive exercise along with prescribed medications to achieve LDL 70mg , HDL >40mg .    Expected Outcomes Short Term: Participant states understanding of desired cholesterol values and is compliant with  medications prescribed. Participant is following exercise prescription and nutrition guidelines.;Long Term: Cholesterol controlled with medications as prescribed, with individualized exercise RX and with personalized nutrition plan. Value goals: LDL < 70mg , HDL > 40 mg.             Education:Diabetes - Individual verbal and written instruction to review signs/symptoms of diabetes, desired ranges of glucose level fasting, after meals and with exercise. Acknowledge that pre and post exercise glucose checks will be done for 3 sessions at entry of program.   Core Components/Risk Factors/Patient Goals Review:    Core Components/Risk Factors/Patient Goals at Discharge (Final Review):    ITP Comments:  ITP Comments     Row Name 10/12/23 1425 10/19/23 1129 10/24/23 0804 11/09/23 1311 12/07/23 1107   ITP Comments Virtual Visit completed. Patient informed on EP and RD appointment and 6 Minute walk test. Patient also informed of patient health questionnaires on My Chart. Patient Verbalizes understanding. Visit diagnosis can be found in [facility] 09/23/2023. Patient is to bring in medication list. Completed and gym orientation for cardiac rehab. Initial ITP created and sent for review to Dr. Firman Hughes, Medical Director. First full day of exercise!  Patient was oriented to gym and equipment including functions, settings, policies,  and procedures.  Patient's individual exercise prescription and treatment plan were reviewed.  All starting workloads were established based on the results of the 6 minute walk test done at initial orientation visit.  The plan for exercise progression was also introduced and progression will be customized based on patient's performance and goals. 30 Day review completed. Medical Director ITP review done, changes made as directed, and signed approval by Medical Director. New to program. 30 Day review completed. Medical Director ITP review done, changes made as directed, and signed approval by Medical Director.            Comments:

## 2023-12-08 ENCOUNTER — Encounter: Admitting: *Deleted

## 2023-12-08 DIAGNOSIS — Z955 Presence of coronary angioplasty implant and graft: Secondary | ICD-10-CM | POA: Diagnosis not present

## 2023-12-08 NOTE — Progress Notes (Signed)
 Daily Session Note  Patient Details  Name: Ryan English MRN: [id number] Date of Birth: Dec 29, 1950 Referring Provider:   Flowsheet Row Cardiac Rehab from 10/19/2023 in [facility] Cardiac and Pulmonary Rehab  Referring Provider Valery Gaucher, MD       Encounter Date: 12/08/2023  Check In:  Session Check In - 12/08/23 1018       Check-In   Supervising physician immediately available to respond to emergencies See telemetry face sheet for immediately available ER MD    Location [facility]-Cardiac & Pulmonary Rehab    Staff Present Maud Sorenson, RN, BSN, CCRP;Joseph Hood RCP,RRT,BSRT;Margaret Best, MS, Exercise Physiologist;Maxon Conetta BS, Exercise Physiologist    Virtual Visit No    Medication changes reported     No    Fall or balance concerns reported    No    Warm-up and Cool-down Performed on first and last piece of equipment    Resistance Training Performed Yes    VAD Patient? No    PAD/SET Patient? No      Pain Assessment   Currently in Pain? No/denies                Social History   Tobacco Use  Smoking Status Never  Smokeless Tobacco Never    Goals Met:  Independence with exercise equipment Exercise tolerated well No report of concerns or symptoms today  Goals Unmet:  Not Applicable  Comments: Pt able to follow exercise prescription today without complaint.  Will continue to monitor for progression.    Dr. Firman Hughes is Medical Director for [facility] Cardiac Rehabilitation.  Dr. Fuad Aleskerov is Medical Director for [facility] Pulmonary Rehabilitation.

## 2023-12-12 ENCOUNTER — Encounter

## 2023-12-12 ENCOUNTER — Encounter: Admitting: *Deleted

## 2023-12-12 DIAGNOSIS — Z955 Presence of coronary angioplasty implant and graft: Secondary | ICD-10-CM | POA: Diagnosis not present

## 2023-12-12 NOTE — Progress Notes (Signed)
 Daily Session Note  Patient Details  Name: Ryan English MRN: [id number] Date of Birth: Feb 20, 1951 Referring Provider:   Flowsheet Row Cardiac Rehab from 10/19/2023 in [facility] Cardiac and Pulmonary Rehab  Referring Provider Valery Gaucher, MD       Encounter Date: 12/12/2023  Check In:  Session Check In - 12/12/23 0757       Check-In   Supervising physician immediately available to respond to emergencies See telemetry face sheet for immediately available ER MD    Location [facility]-Cardiac & Pulmonary Rehab    Staff Present Maud Sorenson, RN, BSN, CCRP;Kelly Bollinger RN,BSN,MPA;Noah Tickle, BS, Exercise Physiologist    Virtual Visit No    Medication changes reported     No    Fall or balance concerns reported    No    Warm-up and Cool-down Performed on first and last piece of equipment    Resistance Training Performed Yes    VAD Patient? No    PAD/SET Patient? No      Pain Assessment   Currently in Pain? No/denies                Social History   Tobacco Use  Smoking Status Never  Smokeless Tobacco Never    Goals Met:  Independence with exercise equipment Exercise tolerated well No report of concerns or symptoms today  Goals Unmet:  Not Applicable  Comments: Pt able to follow exercise prescription today without complaint.  Will continue to monitor for progression.    Dr. Firman Hughes is Medical Director for [facility] Cardiac Rehabilitation.  Dr. Fuad Aleskerov is Medical Director for [facility] Pulmonary Rehabilitation.

## 2023-12-13 ENCOUNTER — Encounter: Admitting: *Deleted

## 2023-12-13 DIAGNOSIS — Z955 Presence of coronary angioplasty implant and graft: Secondary | ICD-10-CM

## 2023-12-13 NOTE — Progress Notes (Signed)
 Daily Session Note  Patient Details  Name: Ryan English MRN: [id number] Date of Birth: 1950/12/10 Referring Provider:   Flowsheet Row Cardiac Rehab from 10/19/2023 in [facility] Cardiac and Pulmonary Rehab  Referring Provider Valery Gaucher, MD       Encounter Date: 12/13/2023  Check In:  Session Check In - 12/13/23 0808       Check-In   Supervising physician immediately available to respond to emergencies See telemetry face sheet for immediately available ER MD    Location [facility]-Cardiac & Pulmonary Rehab    Staff Present Maud Sorenson, RN, BSN, CCRP;Noah Tickle, BS, Exercise Physiologist;Kelly Bollinger [facility]    Virtual Visit No    Medication changes reported     No    Fall or balance concerns reported    No    Resistance Training Performed Yes    VAD Patient? No    PAD/SET Patient? No      Pain Assessment   Currently in Pain? No/denies                Social History   Tobacco Use  Smoking Status Never  Smokeless Tobacco Never    Goals Met:  Independence with exercise equipment Exercise tolerated well No report of concerns or symptoms today  Goals Unmet:  Not Applicable  Comments: Pt able to follow exercise prescription today without complaint.  Will continue to monitor for progression.    Dr. Firman Hughes is Medical Director for [facility] Cardiac Rehabilitation.  Dr. Fuad Aleskerov is Medical Director for [facility] Pulmonary Rehabilitation.

## 2023-12-15 ENCOUNTER — Encounter

## 2023-12-16 DIAGNOSIS — I10 Essential (primary) hypertension: Secondary | ICD-10-CM | POA: Diagnosis not present

## 2023-12-16 DIAGNOSIS — E782 Mixed hyperlipidemia: Secondary | ICD-10-CM | POA: Diagnosis not present

## 2023-12-16 DIAGNOSIS — I2510 Atherosclerotic heart disease of native coronary artery without angina pectoris: Secondary | ICD-10-CM | POA: Diagnosis not present

## 2023-12-16 DIAGNOSIS — Z9861 Coronary angioplasty status: Secondary | ICD-10-CM | POA: Diagnosis not present

## 2023-12-19 ENCOUNTER — Encounter

## 2023-12-19 ENCOUNTER — Encounter: Admitting: *Deleted

## 2023-12-19 DIAGNOSIS — Z955 Presence of coronary angioplasty implant and graft: Secondary | ICD-10-CM

## 2023-12-19 NOTE — Progress Notes (Signed)
 Daily Session Note  Patient Details  Name: Ryan English MRN: [id number] Date of Birth: [birth year] Referring Provider:   Flowsheet Row Cardiac Rehab from 10/19/2023 in [facility] Cardiac and Pulmonary Rehab  Referring Provider Valery Gaucher, MD    Encounter Date: 12/19/2023  Check In:  Session Check In - 12/19/23 0917       Check-In   Supervising physician immediately available to respond to emergencies See telemetry face sheet for immediately available ER MD    Location [facility]-Cardiac & Pulmonary Rehab    Staff Present Maud Sorenson, RN, BSN, CCRP;Meredith Manson Seitz RN,BSN;Maxon [employer], Exercise Physiologist;Kelly Sabra Cramp BS, ACSM CEP, Exercise Physiologist    Virtual Visit No    Medication changes reported     No    Fall or balance concerns reported    No    Warm-up and Cool-down Performed on first and last piece of equipment    Resistance Training Performed Yes    VAD Patient? No    PAD/SET Patient? No      Pain Assessment   Currently in Pain? No/denies             Social History   Tobacco Use  Smoking Status Never  Smokeless Tobacco Never    Goals Met:  Independence with exercise equipment Exercise tolerated well No report of concerns or symptoms today  Goals Unmet:  Not Applicable  Comments: Pt able to follow exercise prescription today without complaint.  Will continue to monitor for progression.    Dr. Firman Hughes is Medical Director for [facility] Cardiac Rehabilitation.  Dr. Fuad Aleskerov is Medical Director for [facility] Pulmonary Rehabilitation.

## 2023-12-20 ENCOUNTER — Encounter

## 2023-12-22 ENCOUNTER — Encounter: Admitting: *Deleted

## 2023-12-22 DIAGNOSIS — Z955 Presence of coronary angioplasty implant and graft: Secondary | ICD-10-CM | POA: Diagnosis not present

## 2023-12-22 NOTE — Progress Notes (Signed)
 Daily Session Note  Patient Details  Name: Ryan English MRN: [id number] Date of Birth: 02/22/1951 Referring Provider:   Flowsheet Row Cardiac Rehab from 10/19/2023 in [facility] Cardiac and Pulmonary Rehab  Referring Provider Valery Gaucher, MD    Encounter Date: 12/22/2023  Check In:  Session Check In - 12/22/23 1353       Check-In   Supervising physician immediately available to respond to emergencies See telemetry face sheet for immediately available ER MD    Location [facility]-Cardiac & Pulmonary Rehab    Staff Present Sue Em RN,BSN;Joseph Therman [place], [place], Exercise Physiologist;Laureen Bevin [place], [place], RRT, CPFT    Virtual Visit No    Medication changes reported     No    Fall or balance concerns reported    No    Warm-up and Cool-down Performed on first and last piece of equipment    Resistance Training Performed Yes    VAD Patient? No      Pain Assessment   Currently in Pain? No/denies             Social History   Tobacco Use  Smoking Status Never  Smokeless Tobacco Never    Goals Met:  Independence with exercise equipment Exercise tolerated well No report of concerns or symptoms today Strength training completed today  Goals Unmet:  Not Applicable  Comments: Pt able to follow exercise prescription today without complaint.  Will continue to monitor for progression.    Dr. Firman Hughes is Medical Director for [facility] Cardiac Rehabilitation.  Dr. Fuad Aleskerov is Medical Director for [facility] Pulmonary Rehabilitation.

## 2023-12-26 ENCOUNTER — Other Ambulatory Visit: Payer: Self-pay | Admitting: Family Medicine

## 2023-12-26 ENCOUNTER — Ambulatory Visit

## 2023-12-26 ENCOUNTER — Encounter

## 2023-12-27 ENCOUNTER — Encounter

## 2023-12-29 ENCOUNTER — Encounter

## 2023-12-29 ENCOUNTER — Ambulatory Visit

## 2024-01-02 ENCOUNTER — Encounter

## 2024-01-03 ENCOUNTER — Encounter

## 2024-01-04 ENCOUNTER — Encounter: Payer: Self-pay | Admitting: *Deleted

## 2024-01-04 DIAGNOSIS — Z955 Presence of coronary angioplasty implant and graft: Secondary | ICD-10-CM

## 2024-01-04 NOTE — Progress Notes (Signed)
 Cardiac Individual Treatment Plan  Patient Details  Name: Ryan English MRN: [id number] Date of Birth: 07/08/1950 Referring Provider:   Flowsheet Row Cardiac Rehab from 10/19/2023 in [facility] Cardiac and Pulmonary Rehab  Referring Provider Neita Breeding, MD    Initial Encounter Date:  Flowsheet Row Cardiac Rehab from 10/19/2023 in [facility] Cardiac and Pulmonary Rehab  Date 10/19/23    Visit Diagnosis: Status post coronary artery stent placement  Patient's Home Medications on Admission:  Current Outpatient Medications:    acetaminophen  (TYLENOL ) 500 MG tablet, Take 1,000 mg by mouth 2 (two) times daily., Disp: , Rfl:    acetaminophen  (TYLENOL ) 500 MG tablet, Take by mouth., Disp: , Rfl:    albuterol  (VENTOLIN  HFA) 108 (90 Base) MCG/ACT inhaler, TAKE 2 PUFFS BY MOUTH EVERY 6 HOURS AS NEEDED FOR WHEEZE OR SHORTNESS OF BREATH, Disp: 18 each, Rfl: 0   amLODipine  (NORVASC ) 2.5 MG tablet, Take 1 tablet (2.5 mg total) by mouth daily., Disp: 30 tablet, Rfl: 11   aspirin  81 MG tablet, Take 81 mg by mouth daily., Disp: , Rfl:    aspirin  EC 81 MG tablet, Take by mouth., Disp: , Rfl:    atorvastatin (LIPITOR) 40 MG tablet, Take 40 mg by mouth daily., Disp: , Rfl:    Dextromethorphan HBr (DELSYM PO), Take by mouth as needed., Disp: , Rfl:    ezetimibe  (ZETIA ) 10 MG tablet, Take 1 tablet (10 mg total) by mouth daily., Disp: 90 tablet, Rfl: 3   famotidine (PEPCID) 20 MG tablet, Take 20 mg by mouth 2 (two) times daily. Maximum strenght, Disp: , Rfl:    famotidine (PEPCID) 20 MG tablet, Take by mouth., Disp: , Rfl:    ipratropium (ATROVENT  HFA) 17 MCG/ACT inhaler, Inhale into the lungs., Disp: , Rfl:    ipratropium (ATROVENT ) 0.03 % nasal spray, Place 2 sprays into both nostrils every 12 (twelve) hours. (Patient taking differently: Place 2 sprays into both nostrils daily as needed (Allergies).), Disp: 30 mL, Rfl: 12   loratadine  (CLARITIN  REDITABS) 10 MG dissolvable tablet, Dissolve 1 tablet (10 mg total) in  the mouth daily., Disp: , Rfl:    loratadine  (CLARITIN ) 10 MG tablet, Take 1 tablet (10 mg total) by mouth daily., Disp: 30 tablet, Rfl: 11   losartan  (COZAAR ) 50 MG tablet, Take 1 tablet by mouth daily., Disp: , Rfl:    losartan  (COZAAR ) 50 MG tablet, Take 1 tablet (50 mg total) by mouth daily., Disp: 90 tablet, Rfl: 0   Misc Natural Products (OSTEO BI-FLEX TRIPLE STRENGTH) TABS, Take 2 tablets by mouth daily., Disp: , Rfl:    montelukast  (SINGULAIR ) 10 MG tablet, TAKE 1 TABLET BY MOUTH NIGHTLY, Disp: 90 tablet, Rfl: 1   montelukast  (SINGULAIR ) 10 MG tablet, Take by mouth., Disp: , Rfl:    nitroGLYCERIN  (NITROSTAT ) 0.4 MG SL tablet, Place under the tongue., Disp: , Rfl:    prasugrel (EFFIENT) 10 MG TABS tablet, Take 1 tablet by mouth daily., Disp: , Rfl:    Pseudoephedrine-guaiFENesin (MUCINEX D PO), Take by mouth as needed., Disp: , Rfl:   Past Medical History: Past Medical History:  Diagnosis Date   Anginal pain (HCC)    Arthritis    GERD (gastroesophageal reflux disease)    Hyperlipidemia     Tobacco Use: Social History   Tobacco Use  Smoking Status Never  Smokeless Tobacco Never    Labs: Review Flowsheet  More data exists      Latest Ref Rng & Units 02/07/2019 02/01/2020 02/02/2021 02/10/2022 02/17/2023  Labs for ITP Cardiac and Pulmonary Rehab  Cholestrol 100 - 199 mg/dL 868  872  858  863  871   LDL (calc) 0 - 99 mg/dL 69  63  69  67  61   HDL-C >39 mg/dL 48  47  52  42  44   Trlycerides 0 - 149 mg/dL 72  90  886  845  867   Hemoglobin A1c 4.8 - 5.6 % - - 5.7  5.5  5.5      Exercise Target Goals: Exercise Program Goal: Individual exercise prescription set using results from initial 6 min walk test and THRR while considering  patient's activity barriers and safety.   Exercise Prescription Goal: Initial exercise prescription builds to 30-45 minutes a day of aerobic activity, 2-3 days per week.  Home exercise guidelines will be given to patient during program as part of  exercise prescription that the participant will acknowledge.   Education: Aerobic Exercise: - Group verbal and visual presentation on the components of exercise prescription. Introduces F.I.T.T principle from ACSM for exercise prescriptions.  Reviews F.I.T.T. principles of aerobic exercise including progression. Written material given at graduation. Flowsheet Row Cardiac Rehab from 12/08/2023 in [facility] Cardiac and Pulmonary Rehab  Date 12/01/23  Educator MB  Instruction Review Code 1- Verbalizes Understanding    Education: Resistance Exercise: - Group verbal and visual presentation on the components of exercise prescription. Introduces F.I.T.T principle from ACSM for exercise prescriptions  Reviews F.I.T.T. principles of resistance exercise including progression. Written material given at graduation.    Education: Exercise & Equipment Safety: - Individual verbal instruction and demonstration of equipment use and safety with use of the equipment. Flowsheet Row Cardiac Rehab from 12/08/2023 in [facility] Cardiac and Pulmonary Rehab  Date 10/12/23  Educator jh  Instruction Review Code 1- Verbalizes Understanding    Education: Exercise Physiology & General Exercise Guidelines: - Group verbal and written instruction with models to review the exercise physiology of the cardiovascular system and associated critical values. Provides general exercise guidelines with specific guidelines to those with heart or lung disease.  Flowsheet Row Cardiac Rehab from 12/08/2023 in [facility] Cardiac and Pulmonary Rehab  Date 11/17/23  Educator MB  Instruction Review Code 1- [employer] Understanding    Education: Flexibility, Balance, Mind/Body Relaxation: - Group verbal and visual presentation with interactive activity on the components of exercise prescription. Introduces F.I.T.T principle from ACSM for exercise prescriptions. Reviews F.I.T.T. principles of flexibility and balance exercise training including progression.  Also discusses the mind body connection.  Reviews various relaxation techniques to help reduce and manage stress (i.e. Deep breathing, progressive muscle relaxation, and visualization). Balance handout provided to take home. Written material given at graduation.   Activity Barriers & Risk Stratification:  Activity Barriers & Cardiac Risk Stratification - 10/19/23 1135       Activity Barriers & Cardiac Risk Stratification   Activity Barriers Other (comment);Neck/Spine Problems;Arthritis    Comments Right and Left knees need to be replaced.    Cardiac Risk Stratification High          6 Minute Walk:  6 Minute Walk     Row Name 10/19/23 1131         6 Minute Walk   Phase Initial     Distance 1135 feet     Walk Time 6 minutes     # of Rest Breaks 0     MPH 2.15     METS 2.42     RPE 11  Perceived Dyspnea  0     VO2 Peak 8.47     Symptoms Yes (comment)     Comments knee soreness     Resting HR 66 bpm     Resting BP 136/70     Resting Oxygen Saturation  97 %     Exercise Oxygen Saturation  during 6 min walk 97 %     Max Ex. HR 95 bpm     Max Ex. BP 144/72     2 Minute Post BP 128/70        Oxygen Initial Assessment:   Oxygen Re-Evaluation:   Oxygen Discharge (Final Oxygen Re-Evaluation):   Initial Exercise Prescription:  Initial Exercise Prescription - 10/19/23 1100       Date of Initial Exercise RX and Referring Provider   Date 10/19/23    Referring Provider Neita Breeding, MD      Oxygen   Maintain Oxygen Saturation 88% or higher      Treadmill   MPH 2.2    Grade 0    Minutes 15    METs 2.68      NuStep   Level 3    SPM 80    Minutes 15    METs 2.42      REL-XR   Level 2    Speed 50    Minutes 15    METs 2.42      Prescription Details   Frequency (times per week) 2    Duration Progress to 30 minutes of continuous aerobic without signs/symptoms of physical distress      Intensity   THRR 40-80% of Max Heartrate 98-130    Ratings of  Perceived Exertion 11-13    Perceived Dyspnea 0-4      Progression   Progression Continue to progress workloads to maintain intensity without signs/symptoms of physical distress.      Resistance Training   Training Prescription Yes    Weight 10 lb    Reps 10-15          Perform Capillary Blood Glucose checks as needed.  Exercise Prescription Changes:   Exercise Prescription Changes     Row Name 10/19/23 1100 11/10/23 1400 11/24/23 1700 12/08/23 1600 12/21/23 1400     Response to Exercise   Blood Pressure (Admit) 136/70 98/62 98/60  122/66 128/76   Blood Pressure (Exercise) 144/72 132/60 134/60 144/64 --   Blood Pressure (Exit) 128/70 100/60 102/80 120/64 122/70   Heart Rate (Admit) 66 bpm 81 bpm 75 bpm 99 bpm 91 bpm   Heart Rate (Exercise) 95 bpm 110 bpm 113 bpm 109 bpm 104 bpm   Heart Rate (Exit) 58 bpm 81 bpm 69 bpm 74 bpm 74 bpm   Oxygen Saturation (Admit) 97 % -- -- -- --   Oxygen Saturation (Exercise) 97 % -- -- -- --   Rating of Perceived Exertion (Exercise) 11 13 15 15 14    Perceived Dyspnea (Exercise) 0 -- -- -- --   Symptoms knee soreness none none none none   Comments Results First two weeks of exercise -- -- --   Duration -- Continue with 30 min of aerobic exercise without signs/symptoms of physical distress. Continue with 30 min of aerobic exercise without signs/symptoms of physical distress. Continue with 30 min of aerobic exercise without signs/symptoms of physical distress. Continue with 30 min of aerobic exercise without signs/symptoms of physical distress.   Intensity -- THRR unchanged THRR unchanged THRR unchanged THRR unchanged     Progression  Progression -- Continue to progress workloads to maintain intensity without signs/symptoms of physical distress. Continue to progress workloads to maintain intensity without signs/symptoms of physical distress. Continue to progress workloads to maintain intensity without signs/symptoms of physical distress.  Continue to progress workloads to maintain intensity without signs/symptoms of physical distress.   Average METs -- 3.22 4.25 3.55 3.54     Resistance Training   Training Prescription -- Yes Yes Yes Yes   Weight -- 10 lb 10 lb 10 lb 10 lb   Reps -- 10-15 10-15 10-15 10-15     Interval Training   Interval Training -- No No No No     Treadmill   MPH -- 2.5 2.5 2.5 2.5   Grade -- 1 1.5 1 0   Minutes -- 15 15 15 15    METs -- 3.26 3.43 3.26 2.91     NuStep   Level -- 6 -- 7  T6 --   Minutes -- 15 -- 15 --   METs -- 5.5 -- 3.4 --     REL-XR   Level -- 2 4 10 8    Minutes -- 15 15 15 15    METs -- -- 9 6.1 3.8     T5 Nustep   Level -- 5 6 -- --   Minutes -- 15 15 -- --   METs -- 3 3.9 -- --     Oxygen   Maintain Oxygen Saturation -- 88% or higher 88% or higher 88% or higher 88% or higher    Row Name 01/03/24 1500             Response to Exercise   Blood Pressure (Admit) 118/64       Blood Pressure (Exit) 124/68       Heart Rate (Admit) 69 bpm       Heart Rate (Exercise) 127 bpm       Heart Rate (Exit) 88 bpm       Rating of Perceived Exertion (Exercise) 16       Symptoms none       Duration Continue with 30 min of aerobic exercise without signs/symptoms of physical distress.       Intensity THRR unchanged         Progression   Progression Continue to progress workloads to maintain intensity without signs/symptoms of physical distress.       Average METs 4.09         Resistance Training   Training Prescription Yes       Weight 10 lb       Reps 10-15         Interval Training   Interval Training No         Treadmill   MPH 2.5       Grade 3.5       Minutes 15       METs 4.12         Biostep-RELP   Level 8       Minutes 15       METs 5         Rower   Level 5       Watts 55       Minutes 15         Oxygen   Maintain Oxygen Saturation 88% or higher          Exercise Comments:   Exercise Comments     Row Name 10/24/23 [phone]  Exercise Comments First full day of exercise!  Patient was oriented to gym and equipment including functions, settings, policies, and procedures.  Patient's individual exercise prescription and treatment plan were reviewed.  All starting workloads were established based on the results of the 6 minute walk test done at initial orientation visit.  The plan for exercise progression was also introduced and progression will be customized based on patient's performance and goals.          Exercise Goals and Review:   Exercise Goals     Row Name 10/19/23 1135             Exercise Goals   Increase Physical Activity Yes       Intervention Provide advice, education, support and counseling about physical activity/exercise needs.;Develop an individualized exercise prescription for aerobic and resistive training based on initial evaluation findings, risk stratification, comorbidities and participant's personal goals.       Expected Outcomes Short Term: Attend rehab on a regular basis to increase amount of physical activity.;Long Term: Add in home exercise to make exercise part of routine and to increase amount of physical activity.;Long Term: Exercising regularly at least 3-5 days a week.       Increase Strength and Stamina Yes       Intervention Provide advice, education, support and counseling about physical activity/exercise needs.;Develop an individualized exercise prescription for aerobic and resistive training based on initial evaluation findings, risk stratification, comorbidities and participant's personal goals.       Expected Outcomes Short Term: Increase workloads from initial exercise prescription for resistance, speed, and METs.;Short Term: Perform resistance training exercises routinely during rehab and add in resistance training at home;Long Term: Improve cardiorespiratory fitness, muscular endurance and strength as measured by increased METs and functional capacity ( )       Able to  understand and use rate of perceived exertion (RPE) scale Yes       Intervention Provide education and explanation on how to use RPE scale       Expected Outcomes Short Term: Able to use RPE daily in rehab to express subjective intensity level;Long Term:  Able to use RPE to guide intensity level when exercising independently       Able to understand and use Dyspnea scale Yes       Intervention Provide education and explanation on how to use Dyspnea scale       Expected Outcomes Short Term: Able to use Dyspnea scale daily in rehab to express subjective sense of shortness of breath during exertion;Long Term: Able to use Dyspnea scale to guide intensity level when exercising independently       Knowledge and understanding of Target Heart Rate Range (THRR) Yes       Intervention Provide education and explanation of THRR including how the numbers were predicted and where they are located for reference       Expected Outcomes Long Term: Able to use THRR to govern intensity when exercising independently;Short Term: Able to state/look up THRR;Short Term: Able to use daily as guideline for intensity in rehab       Able to check pulse independently Yes       Intervention Provide education and demonstration on how to check pulse in carotid and radial arteries.;Review the importance of being able to check your own pulse for safety during independent exercise       Expected Outcomes Short Term: Able to explain why pulse checking is important during independent exercise;Long Term: Able to  check pulse independently and accurately       Understanding of Exercise Prescription Yes       Intervention Provide education, explanation, and written materials on patient's individual exercise prescription       Expected Outcomes Short Term: Able to explain program exercise prescription;Long Term: Able to explain home exercise prescription to exercise independently          Exercise Goals Re-Evaluation :  Exercise Goals  Re-Evaluation     Row Name 10/24/23 0805 11/10/23 1434 11/24/23 1759 12/08/23 1627 12/12/23 0751     Exercise Goal Re-Evaluation   Exercise Goals Review Able to understand and use rate of perceived exertion (RPE) scale;Knowledge and understanding of Target Heart Rate Range (THRR);Understanding of Exercise Prescription;Able to understand and use Dyspnea scale Increase Strength and Stamina;Increase Physical Activity;Understanding of Exercise Prescription Increase Strength and Stamina;Increase Physical Activity;Understanding of Exercise Prescription Increase Strength and Stamina;Increase Physical Activity;Understanding of Exercise Prescription Increase Physical Activity;Increase Strength and Stamina;Understanding of Exercise Prescription   Comments Reviewed RPE and dyspnea scale, THR and program prescription with pt today.  Pt voiced understanding and was given a copy of goals to take home. Jerilynn is off to a good start in the program. He did well on the treadmill at a speed of 2.5 mph with an incline of 1%. He also improved to level 6 on the T4 nustep and level 5 on the T5 nustep. We will continue to monitor his progress in the program. Jerilynn is doing well in rehab. He increased his workload on the treadmill to a speed of 2.5 mph and incline of 1.5%. He also increased to level 4 on the XR and level 6 on the T5 nustep. We will continue to monitor his progress in the program. Jerilynn continues to do well in rehab. He continues to work on the treadmill at a speed of 2.5 mph with an incline of 1%. He also improved to level 10 on the XR and level 7 on the T6 nustep. We will continue to monitor his progress in the program. Lawerence is doing well in rehab. He is walking some at home, most of his activity is yard work.   Expected Outcomes Short: Use RPE daily to regulate intensity. Long: Follow program prescription in THR. Short: Continue to follow current exercise prescription. Long: Continue exercise to  improve strength and stamina. Short: Continue to progressively increase treadmill and XR workloads. Long: Continue exercise to improve strength and stamina. Short: Continue to progressively increase workloads. Long: Continue exercise to improve strength and stamina. STG: Continue to attended rehab and increase workloads as able.    Row Name 12/21/23 1437 01/03/24 1514           Exercise Goal Re-Evaluation   Exercise Goals Review Increase Physical Activity;Increase Strength and Stamina;Understanding of Exercise Prescription Increase Physical Activity;Increase Strength and Stamina;Understanding of Exercise Prescription      Comments Jerilynn continues to do well in rehab. He was able to maintain his workload on the treadmill at a speed of 2.59mph. He was also able to use the XR at level 8. We will continue to monitor his progress in the program. Jerilynn has only attended two sessions since the last review. He was able to increase his treadmill workload to a speed of 2.5 mph with an incline of 3.5%. He also began using the biostep at level 8 and the rower at level 5. We will continue to monitor his progress in the program.      Expected  Outcomes Short: Try to increase treadmill incline to 2%. Long: Continue exercise to improve strength and stamina. Short: Attend rehab more consistently. Long: Continue exercise to improve strength and stamina.         Discharge Exercise Prescription (Final Exercise Prescription Changes):  Exercise Prescription Changes - 01/03/24 1500       Response to Exercise   Blood Pressure (Admit) 118/64    Blood Pressure (Exit) 124/68    Heart Rate (Admit) 69 bpm    Heart Rate (Exercise) 127 bpm    Heart Rate (Exit) 88 bpm    Rating of Perceived Exertion (Exercise) 16    Symptoms none    Duration Continue with 30 min of aerobic exercise without signs/symptoms of physical distress.    Intensity THRR unchanged      Progression   Progression Continue to progress workloads  to maintain intensity without signs/symptoms of physical distress.    Average METs 4.09      Resistance Training   Training Prescription Yes    Weight 10 lb    Reps 10-15      Interval Training   Interval Training No      Treadmill   MPH 2.5    Grade 3.5    Minutes 15    METs 4.12      Biostep-RELP   Level 8    Minutes 15    METs 5      Rower   Level 5    Watts 55    Minutes 15      Oxygen   Maintain Oxygen Saturation 88% or higher          Nutrition:  Target Goals: Understanding of nutrition guidelines, daily intake of sodium 1500mg , cholesterol 200mg , calories 30% from fat and 7% or less from saturated fats, daily to have 5 or more servings of fruits and vegetables.  Education: All About Nutrition: -Group instruction provided by verbal, written material, interactive activities, discussions, models, and posters to present general guidelines for heart healthy nutrition including fat, fiber, MyPlate, the role of sodium in heart healthy nutrition, utilization of the nutrition label, and utilization of this knowledge for meal planning. Follow up email sent as well. Written material given at graduation. Flowsheet Row Cardiac Rehab from 12/08/2023 in [facility] Cardiac and Pulmonary Rehab  Date 12/08/23  Educator JG part 1  Instruction Review Code 1- Verbalizes Understanding    Biometrics:  Pre Biometrics - 10/19/23 1136       Pre Biometrics   Height 5' 10.5 (1.791 m)    Weight 208 lb 4.8 oz (94.5 kg)    Waist Circumference 39 inches    Hip Circumference 40 inches    Waist to Hip Ratio 0.98 %    BMI (Calculated) 29.46    Single Leg Stand 6.55 seconds           Nutrition Therapy Plan and Nutrition Goals:  Nutrition Therapy & Goals - 10/19/23 1130       Nutrition Therapy   RD appointment deferred Yes      Intervention Plan   Intervention Prescribe, educate and counsel regarding individualized specific dietary modifications aiming towards targeted core  components such as weight, hypertension, lipid management, diabetes, heart failure and other comorbidities.    Expected Outcomes Short Term Goal: Understand basic principles of dietary content, such as calories, fat, sodium, cholesterol and nutrients.;Short Term Goal: A plan has been developed with personal nutrition goals set during dietitian appointment.;Long Term Goal: Adherence to prescribed  nutrition plan.          Nutrition Assessments:  MEDIFICTS Score Key: >=70 Need to make dietary changes  40-70 Heart Healthy Diet <= 40 Therapeutic Level Cholesterol Diet   Picture Your Plate Scores: <59 Unhealthy dietary pattern with much room for improvement. 41-50 Dietary pattern unlikely to meet recommendations for good health and room for improvement. 51-60 More healthful dietary pattern, with some room for improvement.  >60 Healthy dietary pattern, although there may be some specific behaviors that could be improved.    Nutrition Goals Re-Evaluation:  Nutrition Goals Re-Evaluation     Row Name 12/12/23 0753             Goals   Comment Deferred RD appointment          Nutrition Goals Discharge (Final Nutrition Goals Re-Evaluation):  Nutrition Goals Re-Evaluation - 12/12/23 0753       Goals   Comment Deferred RD appointment          Psychosocial: Target Goals: Acknowledge presence or absence of significant depression and/or stress, maximize coping skills, provide positive support system. Participant is able to verbalize types and ability to use techniques and skills needed for reducing stress and depression.   Education: Stress, Anxiety, and Depression - Group verbal and visual presentation to define topics covered.  Reviews how body is impacted by stress, anxiety, and depression.  Also discusses healthy ways to reduce stress and to treat/manage anxiety and depression.  Written material given at graduation. Flowsheet Row Cardiac Rehab from 12/08/2023 in [facility] Cardiac and  Pulmonary Rehab  Date 11/10/23  Educator SB  Instruction Review Code 1- [employer] Understanding    Education: Sleep Hygiene -Provides group verbal and written instruction about how sleep can affect your health.  Define sleep hygiene, discuss sleep cycles and impact of sleep habits. Review good sleep hygiene tips.    Initial Review & Psychosocial Screening:  Initial Psych Review & Screening - 10/12/23 1426       Initial Review   Current issues with None Identified      Family Dynamics   Good Support System? Yes    Comments He can look to his wife and two sons for support. He states no issues with his mental state and does not take any medication for his mood.      Barriers   Psychosocial barriers to participate in program The patient should benefit from training in stress management and relaxation.;There are no identifiable barriers or psychosocial needs.      Screening Interventions   Interventions Provide feedback about the scores to participant;Program counselor consult;To provide support and resources with identified psychosocial needs    Expected Outcomes Short Term goal: Utilizing psychosocial counselor, staff and physician to assist with identification of specific Stressors or current issues interfering with healing process. Setting desired goal for each stressor or current issue identified.;Long Term Goal: Stressors or current issues are controlled or eliminated.;Short Term goal: Identification and review with participant of any Quality of Life or Depression concerns found by scoring the questionnaire.;Long Term goal: The participant improves quality of Life and PHQ9 Scores as seen by post scores and/or verbalization of changes          Quality of Life Scores:   Scores of 19 and below usually indicate a poorer quality of life in these areas.  A difference of  2-3 points is a clinically meaningful difference.  A difference of 2-3 points in the total score of the Quality of  Life Index has been associated with significant improvement in overall quality of life, self-image, physical symptoms, and general health in studies assessing change in quality of life.  PHQ-9: Review Flowsheet  More data exists      10/19/2023 08/18/2023 02/17/2023 01/19/2023 11/15/2022  Depression screen PHQ 2/9  Decreased Interest 0 0 0 0 0  Down, Depressed, Hopeless 0 0 0 0 0  PHQ - 2 Score 0 0 0 0 0  Altered sleeping 0 - - - 0  Tired, decreased energy 0 - - - 0  Change in appetite 0 - - - 0  Feeling bad or failure about yourself  0 - - - 0  Trouble concentrating 0 - - - 0  Moving slowly or fidgety/restless 0 - - - 0  Suicidal thoughts 0 - - - 0  PHQ-9 Score 0 - - - 0  Difficult doing work/chores - - - - Not difficult at all   Interpretation of Total Score  Total Score Depression Severity:  1-4 = Minimal depression, 5-9 = Mild depression, 10-14 = Moderate depression, 15-19 = Moderately severe depression, 20-27 = Severe depression   Psychosocial Evaluation and Intervention:  Psychosocial Evaluation - 10/12/23 1427       Psychosocial Evaluation & Interventions   Interventions Encouraged to exercise with the program and follow exercise prescription;Relaxation education;Stress management education    Comments He can look to his wife and two sons for support. He states no issues with his mental state and does not take any medication for his mood.    Expected Outcomes Short: Start [facility] to help with mood. Long: Maintain a healthy mental state    Continue Psychosocial Services  Follow up required by staff          Psychosocial Re-Evaluation:  Psychosocial Re-Evaluation     Row Name 12/12/23 [id number]             Psychosocial Re-Evaluation   Current issues with None Identified       Comments Jerilynn reports he is sleeping well, 6-8hr each night. Denies any stress, anxiety, or depression.       Expected Outcomes STG: Continue to attended rehab. LTG: Achieve and maintain a  positive outlook on health and daily life       Interventions Encouraged to attend Cardiac Rehabilitation for the exercise       Continue Psychosocial Services  Follow up required by staff          Psychosocial Discharge (Final Psychosocial Re-Evaluation):  Psychosocial Re-Evaluation - 12/12/23 0752       Psychosocial Re-Evaluation   Current issues with None Identified    Comments Jerilynn reports he is sleeping well, 6-8hr each night. Denies any stress, anxiety, or depression.    Expected Outcomes STG: Continue to attended rehab. LTG: Achieve and maintain a positive outlook on health and daily life    Interventions Encouraged to attend Cardiac Rehabilitation for the exercise    Continue Psychosocial Services  Follow up required by staff          Vocational Rehabilitation: Provide vocational rehab assistance to qualifying candidates.   Vocational Rehab Evaluation & Intervention:   Education: Education Goals: Education classes will be provided on a variety of topics geared toward better understanding of heart health and risk factor modification. Participant will state understanding/return demonstration of topics presented as noted by education test scores.  Learning Barriers/Preferences:  Learning Barriers/Preferences - 10/12/23 1426       Learning Barriers/Preferences  Learning Barriers None    Learning Preferences None          General Cardiac Education Topics:  AED/CPR: - Group verbal and written instruction with the use of models to demonstrate the basic use of the AED with the basic ABC's of resuscitation.   Anatomy and Cardiac Procedures: - Group verbal and visual presentation and models provide information about basic cardiac anatomy and function. Reviews the testing methods done to diagnose heart disease and the outcomes of the test results. Describes the treatment choices: Medical Management, Angioplasty, or Coronary Bypass Surgery for treating various heart  conditions including Myocardial Infarction, Angina, Valve Disease, and Cardiac Arrhythmias.  Written material given at graduation.   Medication Safety: - Group verbal and visual instruction to review commonly prescribed medications for heart and lung disease. Reviews the medication, class of the drug, and side effects. Includes the steps to properly store meds and maintain the prescription regimen.  Written material given at graduation.   Intimacy: - Group verbal instruction through game format to discuss how heart and lung disease can affect sexual intimacy. Written material given at graduation.. Flowsheet Row Cardiac Rehab from 12/08/2023 in [facility] Cardiac and Pul[facility] Rehab  Date 12/01/23  Educator MB  Instruction Review Code 1- Verbalizes Understanding    Know Your Numbers and Heart Failure: - Group verbal and visual instruction to discuss disease risk factors for cardiac and pulmonary disease and treatment options.  Reviews associated critical values for Overweight/Obesity, Hypertension, Cholesterol, and Diabetes.  Discusses basics of heart failure: signs/symptoms and treatments.  Introduces Heart Failure Zone chart for action plan for heart failure.  Written material given at graduation.   Infection Prevention: - Provides verbal and written material to individual with discussion of infection control including proper hand washing and proper equipment cleaning during exercise session. Flowsheet Row Cardiac Rehab from 12/08/2023 in [facility] Cardiac and Pulmonary Rehab  Date 10/12/23  Educator jh  Instruction Review Code 1- Verbalizes Understanding    Falls Prevention: - Provides verbal and written material to individual with discussion of falls prevention and safety. Flowsheet Row Cardiac Rehab from 12/08/2023 in [facility] Cardiac and Pulmonary Rehab  Date 10/12/23  Educator jh  Instruction Review Code 1- Verbalizes Understanding    Other: -Provides group and verbal instruction on various  topics (see comments)   Knowledge Questionnaire Score:   Core Components/Risk Factors/Patient Goals at Admission:  Personal Goals and Risk Factors at Admission - 10/12/23 1425       Core Components/Risk Factors/Patient Goals on Admission    Weight Management Yes;Weight Maintenance;Weight Loss    Intervention Weight Management: Develop a combined nutrition and exercise program designed to reach desired caloric intake, while maintaining appropriate intake of nutrient and fiber, sodium and fats, and appropriate energy expenditure required for the weight goal.;Weight Management: Provide education and appropriate resources to help participant work on and attain dietary goals.;Weight Management/Obesity: Establish reasonable short term and long term weight goals.    Expected Outcomes Short Term: Continue to assess and modify interventions until short term weight is achieved;Weight Maintenance: Understanding of the daily nutrition guidelines, which includes 25-35% calories from fat, 7% or less cal from saturated fats, less than 200mg  cholesterol, less than 1.5gm of sodium, & 5 or more servings of fruits and vegetables daily;Understanding recommendations for meals to include 15-35% energy as protein, 25-35% energy from fat, 35-60% energy from carbohydrates, less than 200mg  of dietary cholesterol, 20-35 gm of total fiber daily;Weight Loss: Understanding of general recommendations for a balanced  deficit meal plan, which promotes 1-2 lb weight loss per week and includes a negative energy balance of [phone] kcal/d;Understanding of distribution of calorie intake throughout the day with the consumption of 4-5 meals/snacks    Heart Failure Yes    Intervention Provide a combined exercise and nutrition program that is supplemented with education, support and counseling about heart failure. Directed toward relieving symptoms such as shortness of breath, decreased exercise tolerance, and extremity edema.    Expected  Outcomes Improve functional capacity of life;Short term: Attendance in program 2-3 days a week with increased exercise capacity. Reported lower sodium intake. Reported increased fruit and vegetable intake. Reports medication compliance.;Short term: Daily weights obtained and reported for increase. Utilizing diuretic protocols set by physician.;Long term: Adoption of self-care skills and reduction of barriers for early signs and symptoms recognition and intervention leading to self-care maintenance.    Hypertension Yes    Intervention Provide education on lifestyle modifcations including regular physical activity/exercise, weight management, moderate sodium restriction and increased consumption of fresh fruit, vegetables, and low fat dairy, alcohol moderation, and smoking cessation.;Monitor prescription use compliance.    Expected Outcomes Short Term: Continued assessment and intervention until BP is < 140/16mm HG in hypertensive participants. < 130/3mm HG in hypertensive participants with diabetes, heart failure or chronic kidney disease.;Long Term: Maintenance of blood pressure at goal levels.    Lipids Yes    Intervention Provide education and support for participant on nutrition & aerobic/resistive exercise along with prescribed medications to achieve LDL 70mg , HDL >40mg .    Expected Outcomes Short Term: Participant states understanding of desired cholesterol values and is compliant with medications prescribed. Participant is following exercise prescription and nutrition guidelines.;Long Term: Cholesterol controlled with medications as prescribed, with individualized exercise RX and with personalized nutrition plan. Value goals: LDL < 70mg , HDL > 40 mg.          Education:Diabetes - Individual verbal and written instruction to review signs/symptoms of diabetes, desired ranges of glucose level fasting, after meals and with exercise. Acknowledge that pre and post exercise glucose checks will be done  for 3 sessions at entry of program.   Core Components/Risk Factors/Patient Goals Review:   Goals and Risk Factor Review     Row Name 12/12/23 0753             Core Components/Risk Factors/Patient Goals Review   Personal Goals Review Hypertension       Review Jerilynn has a BP cuff at home and checks his BP daily, reports it is consistent and matches readings taken here at rehab.       Expected Outcomes STG: Continue to check BP at home. LTG: Manage risk factors independently          Core Components/Risk Factors/Patient Goals at Discharge (Final Review):   Goals and Risk Factor Review - 12/12/23 0753       Core Components/Risk Factors/Patient Goals Review   Personal Goals Review Hypertension    Review Jerilynn has a BP cuff at home and checks his BP daily, reports it is consistent and matches readings taken here at rehab.    Expected Outcomes STG: Continue to check BP at home. LTG: Manage risk factors independently          ITP Comments:  ITP Comments     Row Name 10/12/23 1425 10/19/23 1129 10/24/23 0804 11/09/23 1311 12/07/23 1107   ITP Comments Virtual Visit completed. Patient informed on EP and RD appointment and 6 Minute walk test. Patient also  informed of patient health questionnaires on My Chart. Patient Verbalizes understanding. Visit diagnosis can be found in [facility] 09/23/2023. Patient is to bring in medication list. Completed and gym orientation for cardiac rehab. Initial ITP created and sent for review to Dr. Oneil Pinal, Medical Director. First full day of exercise!  Patient was oriented to gym and equipment including functions, settings, policies, and procedures.  Patient's individual exercise prescription and treatment plan were reviewed.  All starting workloads were established based on the results of the 6 minute walk test done at initial orientation visit.  The plan for exercise progression was also introduced and progression will be customized based on patient's  performance and goals. 30 Day review completed. Medical Director ITP review done, changes made as directed, and signed approval by Medical Director. New to program. 30 Day review completed. Medical Director ITP review done, changes made as directed, and signed approval by Medical Director.    Row Name 01/04/24 1115           ITP Comments 30 Day review completed. Medical Director ITP review done, changes made as directed, and signed approval by Medical Director.          Comments: 30 day review

## 2024-01-05 ENCOUNTER — Encounter

## 2024-01-09 ENCOUNTER — Encounter

## 2024-01-10 ENCOUNTER — Encounter

## 2024-01-10 DIAGNOSIS — K080 Exfoliation of teeth due to systemic causes: Secondary | ICD-10-CM | POA: Diagnosis not present

## 2024-01-11 ENCOUNTER — Encounter: Attending: Cardiology | Admitting: *Deleted

## 2024-01-11 DIAGNOSIS — Z955 Presence of coronary angioplasty implant and graft: Secondary | ICD-10-CM | POA: Insufficient documentation

## 2024-01-11 NOTE — Progress Notes (Signed)
 Daily Session Note  Patient Details  Name: Ryan English MRN: [id number] Date of Birth: 11-30-1950 Referring Provider:   Flowsheet Row Cardiac Rehab from 10/19/2023 in [facility] Cardiac and Pulmonary Rehab  Referring Provider Neita Breeding, MD    Encounter Date: 01/11/2024  Check In:  Session Check In - 01/11/24 1359       Check-In   Supervising physician immediately available to respond to emergencies See telemetry face sheet for immediately available ER MD    Location [facility]-Cardiac & Pulmonary Rehab    Staff Present Hoy Rodney RN,BSN;Joseph [facility] Dyane BS, ACSM CEP, Exercise Physiologist;Kelly Bollinger [facility]    Virtual Visit No    Medication changes reported     No    Fall or balance concerns reported    No    Warm-up and Cool-down Performed on first and last piece of equipment    Resistance Training Performed Yes    VAD Patient? No    PAD/SET Patient? No      Pain Assessment   Currently in Pain? No/denies             Social History   Tobacco Use  Smoking Status Never  Smokeless Tobacco Never    Goals Met:  Independence with exercise equipment Exercise tolerated well No report of concerns or symptoms today Strength training completed today  Goals Unmet:  Not Applicable  Comments: Pt able to follow exercise prescription today without complaint.  Will continue to monitor for progression.    Dr. Oneil Pinal is Medical Director for [facility] Cardiac Rehabilitation.  Dr. Fuad Aleskerov is Medical Director for [facility] Pulmonary Rehabilitation.

## 2024-01-12 ENCOUNTER — Encounter

## 2024-01-13 ENCOUNTER — Ambulatory Visit

## 2024-01-16 ENCOUNTER — Encounter

## 2024-01-17 ENCOUNTER — Encounter

## 2024-01-19 ENCOUNTER — Encounter

## 2024-01-23 ENCOUNTER — Encounter

## 2024-01-23 ENCOUNTER — Encounter: Admitting: *Deleted

## 2024-01-23 DIAGNOSIS — Z955 Presence of coronary angioplasty implant and graft: Secondary | ICD-10-CM

## 2024-01-23 NOTE — Progress Notes (Signed)
 Daily Session Note  Patient Details  Name: Ryan English MRN: [id number] Date of Birth: 1950-09-15 Referring Provider:   Flowsheet Row Cardiac Rehab from 10/19/2023 in [facility] Cardiac and Pulmonary Rehab  Referring Provider Neita Breeding, MD    Encounter Date: 01/23/2024  Check In:  Session Check In - 01/23/24 1544       Check-In   Supervising physician immediately available to respond to emergencies See telemetry face sheet for immediately available ER MD    Location [facility]-Cardiac & Pulmonary Rehab    Staff Present Hoy Rodney RN,BSN;Joseph [facility] BS, Exercise Physiologist;Margaret Best, MS, Exercise Physiologist    Virtual Visit No    Medication changes reported     No    Warm-up and Cool-down Performed on first and last piece of equipment    Resistance Training Performed Yes    VAD Patient? No    PAD/SET Patient? No      Pain Assessment   Currently in Pain? No/denies             Social History   Tobacco Use  Smoking Status Never  Smokeless Tobacco Never    Goals Met:  Independence with exercise equipment Exercise tolerated well No report of concerns or symptoms today Strength training completed today  Goals Unmet:  Not Applicable  Comments: Pt able to follow exercise prescription today without complaint.  Will continue to monitor for progression.    Dr. Oneil Pinal is Medical Director for [facility] Cardiac Rehabilitation.  Dr. Fuad Aleskerov is Medical Director for [facility] Pulmonary Rehabilitation.

## 2024-01-24 ENCOUNTER — Ambulatory Visit (INDEPENDENT_AMBULATORY_CARE_PROVIDER_SITE_OTHER): Payer: HMO

## 2024-01-24 ENCOUNTER — Encounter

## 2024-01-24 VITALS — BP 118/70 | Ht 71.0 in | Wt 202.1 lb

## 2024-01-24 DIAGNOSIS — Z0000 Encounter for general adult medical examination without abnormal findings: Secondary | ICD-10-CM | POA: Diagnosis not present

## 2024-01-24 NOTE — Progress Notes (Signed)
 Subjective:   Ryan English is a 73 y.o. who presents for a Medicare Wellness preventive visit.  As a reminder, Annual Wellness Visits don't include a physical exam, and some assessments may be limited, especially if this visit is performed virtually. We may recommend an in-person follow-up visit with your provider if needed.  Visit Complete: In person  Persons Participating in Visit: Patient.  AWV Questionnaire: No: Patient Medicare AWV questionnaire was not completed prior to this visit.  Cardiac Risk Factors include: advanced age (>73men, >84 women);hypertension;dyslipidemia;male gender     Objective:    Today's Vitals   01/24/24 1050  BP: 118/70  Weight: 202 lb 1.6 oz (91.7 kg)  Height: 5' 11 (1.803 m)   Body mass index is 28.19 kg/m.     01/24/2024   11:02 AM 10/12/2023    2:23 PM 07/08/2023    7:51 AM 01/19/2023   12:02 PM 04/08/2021    8:28 AM 11/06/2015    2:19 PM 08/20/2015    2:48 PM  Advanced Directives  Does Patient Have a Medical Advance Directive? 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  No - Patient declined No - Patient declined      Would patient like information on creating a medical advance directive? No - Patient declined    No - Patient declined       Data saved with a previous flowsheet row definition    Current Medications (verified) Outpatient Encounter Medications as of 01/24/2024  Medication Sig   acetaminophen  (TYLENOL ) 500 MG tablet Take by mouth.   albuterol  (VENTOLIN  HFA) 108 (90 Base) MCG/ACT inhaler TAKE 2 PUFFS BY MOUTH EVERY 6 HOURS AS NEEDED FOR WHEEZE OR SHORTNESS OF BREATH   amLODipine  (NORVASC ) 2.5 MG tablet Take 1 tablet (2.5 mg total) by mouth daily.   aspirin  EC 81 MG tablet Take by mouth.   atorvastatin (LIPITOR) 40 MG tablet Take 40 mg by mouth daily.   Dextromethorphan HBr (DELSYM PO) Take  by mouth as needed.   ezetimibe  (ZETIA ) 10 MG tablet Take 1 tablet (10 mg total) by mouth daily.   famotidine (PEPCID) 20 MG tablet Take by mouth.   ipratropium (ATROVENT ) 0.03 % nasal spray Place 2 sprays into both nostrils every 12 (twelve) hours.   loratadine  (CLARITIN  REDITABS) 10 MG dissolvable tablet Dissolve 1 tablet (10 mg total) in the mouth daily.   losartan  (COZAAR ) 50 MG tablet Take 1 tablet (50 mg total) by mouth daily.   Misc Natural Products (OSTEO BI-FLEX TRIPLE STRENGTH) TABS Take 2 tablets by mouth daily.   montelukast  (SINGULAIR ) 10 MG tablet Take by mouth.   nitroGLYCERIN  (NITROSTAT ) 0.4 MG SL tablet Place under the tongue.   prasugrel (EFFIENT) 10 MG TABS tablet Take 1 tablet by mouth daily.   Pseudoephedrine-guaiFENesin (MUCINEX D PO) Take by mouth as needed.   acetaminophen  (TYLENOL ) 500 MG tablet Take 1,000 mg by mouth 2 (two) times daily.   aspirin  81 MG tablet Take 81 mg by mouth daily.   famotidine (PEPCID) 20 MG tablet Take 20 mg by mouth 2 (two) times daily. Maximum strenght   ipratropium (ATROVENT  HFA) 17 MCG/ACT inhaler Inhale into the lungs.   loratadine  (CLARITIN ) 10 MG tablet Take 1 tablet (10 mg total) by mouth daily.   losartan  (COZAAR ) 50 MG tablet Take  1 tablet by mouth daily.   montelukast  (SINGULAIR ) 10 MG tablet TAKE 1 TABLET BY MOUTH NIGHTLY   No facility-administered encounter medications on file as of 01/24/2024.    Allergies (verified) Patient has no known allergies.   History: Past Medical History:  Diagnosis Date   Anginal pain (HCC)    Arthritis    GERD (gastroesophageal reflux disease)    Hyperlipidemia    Past Surgical History:  Procedure Laterality Date   APPENDECTOMY  1999   COLONOSCOPY WITH PROPOFOL  N/A 04/08/2021   Procedure: COLONOSCOPY WITH PROPOFOL ;  Surgeon: Unk Corinn Skiff, MD;  Location: [facility] ENDOSCOPY;  Service: Gastroenterology;  Laterality: N/A;   LEFT HEART CATH AND CORONARY ANGIOGRAPHY Left 07/08/2023   Procedure:  LEFT HEART CATH AND CORONARY ANGIOGRAPHY;  Surgeon: [name], MD;  Location: [facility] INVASIVE CV LAB;  Service: Cardiovascular;  Laterality: Left;   Family History  Problem Relation Age of Onset   Cancer Mother    Diabetes Sister    Diabetes Sister    Heart disease Brother        MI   Heart disease Brother 98   Diabetes Brother    Diabetes Brother    Social History   Socioeconomic History   Marital status: Married    Spouse name: Not on file   Number of children: 2   Years of education: Not on file   Highest education level: 12th grade  Occupational History   Not on file  Tobacco Use   Smoking status: Never   Smokeless tobacco: Never  Vaping Use   Vaping status: Never Used  Substance and Sexual Activity   Alcohol use: Yes    Alcohol/week: 6.0 standard drinks of alcohol    Types: 6 Cans of beer per week    Comment: occasionally   Drug use: No   Sexual activity: Not on file  Other Topics Concern   Not on file  Social History Narrative   ** Merged History Encounter **       Social Drivers of Health   Financial Resource Strain: Low Risk  (01/24/2024)   Overall Financial Resource Strain (CARDIA)    Difficulty of Paying Living Expenses: Not hard at all  Food Insecurity: No Food Insecurity (01/24/2024)   Hunger Vital Sign    Worried About Running Out of Food in the Last Year: Never true    Ran Out of Food in the Last Year: Never true  Transportation Needs: No Transportation Needs (01/24/2024)   PRAPARE - Administrator, Civil Service (Medical): No    Lack of Transportation (Non-Medical): No  Physical Activity: Sufficiently Active (01/24/2024)   Exercise Vital Sign    Days of Exercise per Week: 7 days    Minutes of Exercise per Session: 60 min  Stress: No Stress Concern Present (01/24/2024)   [employer] of Occupational Health - Occupational Stress Questionnaire    Feeling of Stress: Not at all  Social Connections: Unknown (01/24/2024)   Social  Connection and Isolation Panel    Frequency of Communication with Friends and Family: Not on file    Frequency of Social Gatherings with Friends and Family: Not on file    Attends Religious Services: Not on file    Active Member of Clubs or Organizations: Yes    Attends Banker Meetings: 1 to 4 times per year    Marital Status: Not on file  Recent Concern: Social Connections - Moderately Isolated (01/24/2024)   Social Connection and  Isolation Panel    Frequency of Communication with Friends and Family: More than three times a week    Frequency of Social Gatherings with Friends and Family: Three times a week    Attends Religious Services: Never    Active Member of Clubs or Organizations: No    Attends Banker Meetings: Never    Marital Status: Married    Tobacco Counseling Counseling given: Not Answered    Clinical Intake:  Pre-visit preparation completed: Yes  Pain : No/denies pain     BMI - recorded: 28.19 Nutritional Status: BMI 25 -29 Overweight Nutritional Risks: None Diabetes: No  Lab Results  Component Value Date   HGBA1C 5.5 02/17/2023   HGBA1C 5.5 02/10/2022   HGBA1C 5.7 (H) 02/02/2021     How often do you need to have someone help you when you read instructions, pamphlets, or other written materials from your doctor or pharmacy?: 1 - Never  Interpreter Needed?: No  Information entered by :: JHONNIE DAS, LPN   Activities of Daily Living     01/24/2024   11:06 AM 01/23/2024   10:47 PM  In your present state of health, do you have any difficulty performing the following activities:  Hearing? 0 0  Vision? 0 0  Difficulty concentrating or making decisions? 0 0  Walking or climbing stairs? 1 1  Comment KNEE PAIN   Dressing or bathing? 0 0  Doing errands, shopping? 0 0  Preparing Food and eating ? N N  Using the Toilet? N N  In the past six months, have you accidently leaked urine? N N  Do you have problems with loss of bowel  control? N N  Managing your Medications? N N  Managing your Finances? N N  Housekeeping or managing your Housekeeping? N N    Patient Care Team: [name], MD as PCP - General (Family Medicine) Dessa Reyes ORN, MD as Consulting Physician (General Surgery) Hooten, Lynwood SQUIBB, MD (Orthopedic Surgery) Pa, [facility] (Optometry)  I have updated your Care Teams any recent Medical Services you may have received from other providers in the past year.     Assessment:   This is a routine wellness examination for [place].  Hearing/Vision screen Hearing Screening - Comments:: NO AIDS Vision Screening - Comments:: NO GLASSES- [facility]   Goals Addressed             This Visit's Progress    DIET - EAT MORE FRUITS AND VEGETABLES         Depression Screen     01/24/2024   11:00 AM 10/19/2023   11:31 AM 08/18/2023    2:19 PM 02/17/2023    1:23 PM 01/19/2023   11:59 AM 11/15/2022    1:15 PM 08/04/2022    2:56 PM  PHQ 2/9 Scores  PHQ - 2 Score 0 0 0 0 0 0 0  PHQ- 9 Score 0 0    0 0    Fall Risk     01/23/2024   10:47 PM 10/12/2023    2:21 PM 08/18/2023    2:19 PM 02/17/2023    1:23 PM 01/19/2023    7:38 AM  Fall Risk   Falls in the past year? 0 0 0 0 0  Number falls in past yr: 0 0 0 0 0  Injury with Fall? 0 0 0 0 0  Risk for fall due to : No Fall Risks No Fall Risks No Fall Risks No Fall  Risks No Fall Risks  Follow up Falls evaluation completed;Falls prevention discussed Falls evaluation completed;Education provided;Falls prevention discussed Falls evaluation completed Falls evaluation completed Education provided;Falls prevention discussed    MEDICARE RISK AT HOME:  Medicare Risk at Home Any stairs in or around the home?: Yes If so, are there any without handrails?: Yes Home free of loose throw rugs in walkways, pet beds, electrical cords, etc?: Yes Adequate lighting in your home to reduce risk of falls?: Yes Life alert?: No Use of a cane, walker or w/c?:  No Grab bars in the bathroom?: Yes Shower chair or bench in shower?: No Elevated toilet seat or a handicapped toilet?: Yes  TIMED UP AND GO:  Was the test performed?  Yes  Length of time to ambulate 10 feet: 4 sec Gait steady and fast without use of assistive device  Cognitive Function: 6CIT completed        01/24/2024   11:09 AM 01/19/2023   12:03 PM 02/10/2022   11:18 AM  6CIT Screen  What Year? 0 points 0 points 0 points  What month? 0 points 0 points 0 points  What time? 0 points 0 points 0 points  Count back from 20 0 points 0 points 0 points  Months in reverse 0 points 0 points 0 points  Repeat phrase 0 points 0 points 0 points  Total Score 0 points 0 points 0 points    Immunizations Immunization History  Administered Date(s) Administered   Fluad Quad(high Dose 65+) 02/17/2023   Influenza, High Dose Seasonal PF 06/07/2017, 04/26/2018, 04/26/2018   Influenza,inj,Quad PF,6+ Mos 04/05/2015   Influenza-Unspecified 04/05/2015, 04/13/2019   PFIZER Comirnaty(Gray Top)Covid-19 Tri-Sucrose Vaccine 02/02/2021   PFIZER(Purple Top)SARS-COV-2 Vaccination 07/25/2019, 08/15/2019, 05/14/2020   Pfizer Covid-19 Vaccine Bivalent Booster 68yrs & up 05/12/2021   Pneumococcal Conjugate-13 11/17/2016   Pneumococcal Polysaccharide-23 11/22/2017   Td 05/17/2007   Tdap 05/17/2007, 09/23/2017   Zoster Recombinant(Shingrix) 05/18/2019, 10/03/2019   Zoster, Live 08/21/2012    Screening Tests Health Maintenance  Topic Date Due   COVID-19 Vaccine (6 - 2024-25 season) 03/06/2023   INFLUENZA VACCINE  02/03/2024   Medicare Annual Wellness (AWV)  01/23/2025   Colonoscopy  04/08/2026   DTaP/Tdap/Td (4 - Td or Tdap) 09/24/2027   Pneumococcal Vaccine: 50+ Years  Completed   Hepatitis C Screening  Completed   Zoster Vaccines- Shingrix  Completed   Hepatitis B Vaccines  Aged Out   HPV VACCINES  Aged Out   Meningococcal B Vaccine  Aged Out    Health Maintenance  Health Maintenance Due   Topic Date Due   COVID-19 Vaccine (6 - 2024-25 season) 03/06/2023   Health Maintenance Items Addressed: UP TO DATE ON COLONOSCOPY; NEEDS EYE EXAM; UP TO DATE ON SHOTS EXCEPT COVID  Additional Screening:  Vision Screening: Recommended annual ophthalmology exams for early detection of glaucoma and other disorders of the eye. Would you like a referral to an eye doctor? No    Dental Screening: Recommended annual dental exams for proper oral hygiene  Community Resource Referral / Chronic Care Management: CRR required this visit?  No   CCM required this visit?  No   Plan:    I have personally reviewed and noted the following in the patient's chart:   Medical and social history Use of alcohol, tobacco or illicit drugs  Current medications and supplements including opioid prescriptions. Patient is not currently taking opioid prescriptions. Functional ability and status Nutritional status Physical activity Advanced directives List of other physicians Hospitalizations,  surgeries, and ER visits in previous 12 months Vitals Screenings to include cognitive, depression, and falls Referrals and appointments  In addition, I have reviewed and discussed with patient certain preventive protocols, quality metrics, and best practice recommendations. A written personalized care plan for preventive services as well as general preventive health recommendations were provided to patient.   Jhonnie GORMAN Das, LPN   2/77/7974   After Visit Summary: (In Person-Declined) Patient declined AVS at this time.  Notes: Nothing significant to report at this time.

## 2024-01-24 NOTE — Patient Instructions (Addendum)
 Ryan English , Thank you for taking time out of your busy schedule to complete your Annual Wellness Visit with me. I enjoyed our conversation and look forward to speaking with you again next year. I, as well as your care team,  appreciate your ongoing commitment to your health goals. Please review the following plan we discussed and let me know if I can assist you in the future.    Follow up Visits: Next Medicare AWV with our clinical staff:   01/29/25 @ 10:50 AM IN PERSON Have you seen your provider in the last 6 months (3 months if uncontrolled diabetes)? Yes  Clinician Recommendations:  Aim for 30 minutes of exercise or brisk walking, 6-8 glasses of water, and 5 servings of fruits and vegetables each day. TAKE CARE!      This is a list of the screening recommended for you and due dates:  Health Maintenance  Topic Date Due   COVID-19 Vaccine (6 - 2024-25 season) 03/06/2023   Flu Shot  02/03/2024   Medicare Annual Wellness Visit  01/23/2025   Colon Cancer Screening  04/08/2026   DTaP/Tdap/Td vaccine (4 - Td or Tdap) 09/24/2027   Pneumococcal Vaccine for age over 84  Completed   Hepatitis C Screening  Completed   Zoster (Shingles) Vaccine  Completed   Hepatitis B Vaccine  Aged Out   HPV Vaccine  Aged Out   Meningitis B Vaccine  Aged Out    Advanced directives: (ACP Link)Information on Advanced Care Planning can be found at [place]  Best boy Advance Health Care Directives Advance Health Care Directives. [email or web address]  Advance Care Planning is important because it:  [x]  Makes sure you receive the medical care that is consistent with your values, goals, and preferences  [x]  It provides guidance to your family and loved ones and reduces their decisional burden about whether or not they are making the right decisions based on your wishes.  Follow the link provided in your after visit summary or read over the paperwork we have mailed to you to help you started getting your  Advance Directives in place. If you need assistance in completing these, please reach out to us  so that we can help you!

## 2024-01-26 ENCOUNTER — Encounter

## 2024-01-27 ENCOUNTER — Encounter

## 2024-01-27 DIAGNOSIS — Z955 Presence of coronary angioplasty implant and graft: Secondary | ICD-10-CM

## 2024-01-27 NOTE — Progress Notes (Signed)
 Daily Session Note  Patient Details  Name: Ryan English MRN: [id number] Date of Birth: [birth year] Referring Provider:   Flowsheet Row Cardiac Rehab from 10/19/2023 in [facility]  Referring Provider Neita Breeding, MD    Encounter Date: 01/27/2024  Check In:  Session Check In - 01/27/24 0918       Check-In   Supervising physician immediately available to respond to emergencies See telemetry face sheet for immediately available ER MD    Location [facility]-Cardiac & Pulmonary Rehab    Staff Present Burnard Davenport RN,BSN,MPA;Maxon Conetta BS, Exercise Physiologist;Joseph Rolinda NORWOOD HARMAN Verlie Laird, [place], Exercise Physiologist    Virtual Visit No    Medication changes reported     No    Fall or balance concerns reported    No    Warm-up and Cool-down Performed on first and last piece of equipment    Resistance Training Performed Yes    VAD Patient? No    PAD/SET Patient? No      Pain Assessment   Currently in Pain? No/denies             Social History   Tobacco Use  Smoking Status Never  Smokeless Tobacco Never    Goals Met:  Independence with exercise equipment Exercise tolerated well No report of concerns or symptoms today Strength training completed today  Goals Unmet:  Not Applicable  Comments: Pt able to follow exercise prescription today without complaint.  Will continue to monitor for progression.    Dr. Oneil Pinal is Medical Director for [facility] Cardiac Rehabilitation.  Dr. Fuad Aleskerov is Medical Director for [facility] Pulmonary Rehabilitation.

## 2024-01-30 ENCOUNTER — Encounter

## 2024-01-30 ENCOUNTER — Ambulatory Visit

## 2024-01-31 ENCOUNTER — Encounter

## 2024-02-01 ENCOUNTER — Ambulatory Visit

## 2024-02-01 DIAGNOSIS — Z955 Presence of coronary angioplasty implant and graft: Secondary | ICD-10-CM

## 2024-02-01 NOTE — Progress Notes (Signed)
 Cardiac Individual Treatment Plan  Patient Details  Name: Ryan English MRN: [id number] Date of Birth: 1950/09/12 Referring Provider:   Flowsheet Row Cardiac Rehab from 10/19/2023 in [facility] Cardiac and Pulmonary Rehab  Referring Provider Neita Breeding, MD    Initial Encounter Date:  Flowsheet Row Cardiac Rehab from 10/19/2023 in [facility] Cardiac and Pulmonary Rehab  Date 10/19/23    Visit Diagnosis: Status post coronary artery stent placement  Patient's Home Medications on Admission:  Current Outpatient Medications:    acetaminophen  (TYLENOL ) 500 MG tablet, Take 1,000 mg by mouth 2 (two) times daily., Disp: , Rfl:    acetaminophen  (TYLENOL ) 500 MG tablet, Take by mouth., Disp: , Rfl:    albuterol  (VENTOLIN  HFA) 108 (90 Base) MCG/ACT inhaler, TAKE 2 PUFFS BY MOUTH EVERY 6 HOURS AS NEEDED FOR WHEEZE OR SHORTNESS OF BREATH, Disp: 18 each, Rfl: 0   amLODipine  (NORVASC ) 2.5 MG tablet, Take 1 tablet (2.5 mg total) by mouth daily., Disp: 30 tablet, Rfl: 11   aspirin  81 MG tablet, Take 81 mg by mouth daily., Disp: , Rfl:    aspirin  EC 81 MG tablet, Take by mouth., Disp: , Rfl:    atorvastatin (LIPITOR) 40 MG tablet, Take 40 mg by mouth daily., Disp: , Rfl:    Dextromethorphan HBr (DELSYM PO), Take by mouth as needed., Disp: , Rfl:    ezetimibe  (ZETIA ) 10 MG tablet, Take 1 tablet (10 mg total) by mouth daily., Disp: 90 tablet, Rfl: 3   famotidine (PEPCID) 20 MG tablet, Take 20 mg by mouth 2 (two) times daily. Maximum strenght, Disp: , Rfl:    famotidine (PEPCID) 20 MG tablet, Take by mouth., Disp: , Rfl:    ipratropium (ATROVENT  HFA) 17 MCG/ACT inhaler, Inhale into the lungs., Disp: , Rfl:    ipratropium (ATROVENT ) 0.03 % nasal spray, Place 2 sprays into both nostrils every 12 (twelve) hours., Disp: 30 mL, Rfl: 12   loratadine  (CLARITIN  REDITABS) 10 MG dissolvable tablet, Dissolve 1 tablet (10 mg total) in the mouth daily., Disp: , Rfl:    loratadine  (CLARITIN ) 10 MG tablet, Take 1 tablet (10 mg  total) by mouth daily., Disp: 30 tablet, Rfl: 11   losartan  (COZAAR ) 50 MG tablet, Take 1 tablet by mouth daily., Disp: , Rfl:    losartan  (COZAAR ) 50 MG tablet, Take 1 tablet (50 mg total) by mouth daily., Disp: 90 tablet, Rfl: 0   Misc Natural Products (OSTEO BI-FLEX TRIPLE STRENGTH) TABS, Take 2 tablets by mouth daily., Disp: , Rfl:    montelukast  (SINGULAIR ) 10 MG tablet, TAKE 1 TABLET BY MOUTH NIGHTLY, Disp: 90 tablet, Rfl: 1   montelukast  (SINGULAIR ) 10 MG tablet, Take by mouth., Disp: , Rfl:    nitroGLYCERIN  (NITROSTAT ) 0.4 MG SL tablet, Place under the tongue., Disp: , Rfl:    prasugrel (EFFIENT) 10 MG TABS tablet, Take 1 tablet by mouth daily., Disp: , Rfl:    Pseudoephedrine-guaiFENesin (MUCINEX D PO), Take by mouth as needed., Disp: , Rfl:   Past Medical History: Past Medical History:  Diagnosis Date   Anginal pain (HCC)    Arthritis    GERD (gastroesophageal reflux disease)    Hyperlipidemia     Tobacco Use: Social History   Tobacco Use  Smoking Status Never  Smokeless Tobacco Never    Labs: Review Flowsheet  More data exists      Latest Ref Rng & Units 02/07/2019 02/01/2020 02/02/2021 02/10/2022 02/17/2023  Labs for ITP Cardiac and Pulmonary Rehab  Cholestrol 100 - 199  mg/dL 868  872  858  863  871   LDL (calc) 0 - 99 mg/dL 69  63  69  67  61   HDL-C >39 mg/dL 48  47  52  42  44   Trlycerides 0 - 149 mg/dL 72  90  886  845  867   Hemoglobin A1c 4.8 - 5.6 % - - 5.7  5.5  5.5      Exercise Target Goals: Exercise Program Goal: Individual exercise prescription set using results from initial 6 min walk test and THRR while considering  patient's activity barriers and safety.   Exercise Prescription Goal: Initial exercise prescription builds to 30-45 minutes a day of aerobic activity, 2-3 days per week.  Home exercise guidelines will be given to patient during program as part of exercise prescription that the participant will acknowledge.   Education: Aerobic  Exercise: - Group verbal and visual presentation on the components of exercise prescription. Introduces F.I.T.T principle from ACSM for exercise prescriptions.  Reviews F.I.T.T. principles of aerobic exercise including progression. Written material given at graduation. Flowsheet Row Cardiac Rehab from 12/08/2023 in [facility] Cardiac and Pulmonary Rehab  Date 12/01/23  Educator MB  Instruction Review Code 1- Verbalizes Understanding    Education: Resistance Exercise: - Group verbal and visual presentation on the components of exercise prescription. Introduces F.I.T.T principle from ACSM for exercise prescriptions  Reviews F.I.T.T. principles of resistance exercise including progression. Written material given at graduation.    Education: Exercise & Equipment Safety: - Individual verbal instruction and demonstration of equipment use and safety with use of the equipment. Flowsheet Row Cardiac Rehab from 12/08/2023 in [facility] Cardiac and Pulmonary Rehab  Date 10/12/23  Educator jh  Instruction Review Code 1- Verbalizes Understanding    Education: Exercise Physiology & General Exercise Guidelines: - Group verbal and written instruction with models to review the exercise physiology of the cardiovascular system and associated critical values. Provides general exercise guidelines with specific guidelines to those with heart or lung disease.  Flowsheet Row Cardiac Rehab from 12/08/2023 in [facility] Cardiac and Pulmonary Rehab  Date 11/17/23  Educator MB  Instruction Review Code 1- [employer] Understanding    Education: Flexibility, Balance, Mind/Body Relaxation: - Group verbal and visual presentation with interactive activity on the components of exercise prescription. Introduces F.I.T.T principle from ACSM for exercise prescriptions. Reviews F.I.T.T. principles of flexibility and balance exercise training including progression. Also discusses the mind body connection.  Reviews various relaxation techniques to help  reduce and manage stress (i.e. Deep breathing, progressive muscle relaxation, and visualization). Balance handout provided to take home. Written material given at graduation.   Activity Barriers & Risk Stratification:  Activity Barriers & Cardiac Risk Stratification - 10/19/23 1135       Activity Barriers & Cardiac Risk Stratification   Activity Barriers Other (comment);Neck/Spine Problems;Arthritis    Comments Right and Left knees need to be replaced.    Cardiac Risk Stratification High          6 Minute Walk:  6 Minute Walk     Row Name 10/19/23 1131         6 Minute Walk   Phase Initial     Distance 1135 feet     Walk Time 6 minutes     # of Rest Breaks 0     MPH 2.15     METS 2.42     RPE 11     Perceived Dyspnea  0     VO2  Peak 8.47     Symptoms Yes (comment)     Comments knee soreness     Resting HR 66 bpm     Resting BP 136/70     Resting Oxygen Saturation  97 %     Exercise Oxygen Saturation  during 6 min walk 97 %     Max Ex. HR 95 bpm     Max Ex. BP 144/72     2 Minute Post BP 128/70        Oxygen Initial Assessment:   Oxygen Re-Evaluation:   Oxygen Discharge (Final Oxygen Re-Evaluation):   Initial Exercise Prescription:  Initial Exercise Prescription - 10/19/23 1100       Date of Initial Exercise RX and Referring Provider   Date 10/19/23    Referring Provider Neita Breeding, MD      Oxygen   Maintain Oxygen Saturation 88% or higher      Treadmill   MPH 2.2    Grade 0    Minutes 15    METs 2.68      NuStep   Level 3    SPM 80    Minutes 15    METs 2.42      REL-XR   Level 2    Speed 50    Minutes 15    METs 2.42      Prescription Details   Frequency (times per week) 2    Duration Progress to 30 minutes of continuous aerobic without signs/symptoms of physical distress      Intensity   THRR 40-80% of Max Heartrate 98-130    Ratings of Perceived Exertion 11-13    Perceived Dyspnea 0-4      Progression   Progression  Continue to progress workloads to maintain intensity without signs/symptoms of physical distress.      Resistance Training   Training Prescription Yes    Weight 10 lb    Reps 10-15          Perform Capillary Blood Glucose checks as needed.  Exercise Prescription Changes:   Exercise Prescription Changes     Row Name 10/19/23 1100 11/10/23 1400 11/24/23 1700 12/08/23 1600 12/21/23 1400     Response to Exercise   Blood Pressure (Admit) 136/70 98/62 98/60  122/66 128/76   Blood Pressure (Exercise) 144/72 132/60 134/60 144/64 --   Blood Pressure (Exit) 128/70 100/60 102/80 120/64 122/70   Heart Rate (Admit) 66 bpm 81 bpm 75 bpm 99 bpm 91 bpm   Heart Rate (Exercise) 95 bpm 110 bpm 113 bpm 109 bpm 104 bpm   Heart Rate (Exit) 58 bpm 81 bpm 69 bpm 74 bpm 74 bpm   Oxygen Saturation (Admit) 97 % -- -- -- --   Oxygen Saturation (Exercise) 97 % -- -- -- --   Rating of Perceived Exertion (Exercise) 11 13 15 15 14    Perceived Dyspnea (Exercise) 0 -- -- -- --   Symptoms knee soreness none none none none   Comments Results First two weeks of exercise -- -- --   Duration -- Continue with 30 min of aerobic exercise without signs/symptoms of physical distress. Continue with 30 min of aerobic exercise without signs/symptoms of physical distress. Continue with 30 min of aerobic exercise without signs/symptoms of physical distress. Continue with 30 min of aerobic exercise without signs/symptoms of physical distress.   Intensity -- THRR unchanged THRR unchanged THRR unchanged THRR unchanged     Progression   Progression -- Continue to progress workloads to maintain  intensity without signs/symptoms of physical distress. Continue to progress workloads to maintain intensity without signs/symptoms of physical distress. Continue to progress workloads to maintain intensity without signs/symptoms of physical distress. Continue to progress workloads to maintain intensity without signs/symptoms of physical  distress.   Average METs -- 3.22 4.25 3.55 3.54     Resistance Training   Training Prescription -- Yes Yes Yes Yes   Weight -- 10 lb 10 lb 10 lb 10 lb   Reps -- 10-15 10-15 10-15 10-15     Interval Training   Interval Training -- No No No No     Treadmill   MPH -- 2.5 2.5 2.5 2.5   Grade -- 1 1.5 1 0   Minutes -- 15 15 15 15    METs -- 3.26 3.43 3.26 2.91     NuStep   Level -- 6 -- 7  T6 --   Minutes -- 15 -- 15 --   METs -- 5.5 -- 3.4 --     REL-XR   Level -- 2 4 10 8    Minutes -- 15 15 15 15    METs -- -- 9 6.1 3.8     T5 Nustep   Level -- 5 6 -- --   Minutes -- 15 15 -- --   METs -- 3 3.9 -- --     Oxygen   Maintain Oxygen Saturation -- 88% or higher 88% or higher 88% or higher 88% or higher    Row Name 01/03/24 1500 01/17/24 1500 01/31/24 1300         Response to Exercise   Blood Pressure (Admit) 118/64 118/64 104/62     Blood Pressure (Exit) 124/68 102/62 122/66     Heart Rate (Admit) 69 bpm 73 bpm 55 bpm     Heart Rate (Exercise) 127 bpm 114 bpm 115 bpm     Heart Rate (Exit) 88 bpm 93 bpm 84 bpm     Rating of Perceived Exertion (Exercise) 16 14 14      Symptoms none none none     Duration Continue with 30 min of aerobic exercise without signs/symptoms of physical distress. Continue with 30 min of aerobic exercise without signs/symptoms of physical distress. Continue with 30 min of aerobic exercise without signs/symptoms of physical distress.     Intensity THRR unchanged THRR unchanged THRR unchanged       Progression   Progression Continue to progress workloads to maintain intensity without signs/symptoms of physical distress. Continue to progress workloads to maintain intensity without signs/symptoms of physical distress. Continue to progress workloads to maintain intensity without signs/symptoms of physical distress.     Average METs 4.09 4.87 4.46       Resistance Training   Training Prescription Yes Yes Yes     Weight 10 lb 10 lb 10 lb     Reps 10-15  10-15 10-15       Interval Training   Interval Training No No No       Treadmill   MPH 2.5 2.8 2.5     Grade 3.5 0 4.5     Minutes 15 15 15      METs 4.12 3.14 4.47       NuStep   Level -- 6 --     Minutes -- 15 --     METs -- 6.6 --       REL-XR   Level -- -- 7     Minutes -- -- 15     METs -- --  5.3       Biostep-RELP   Level 8 -- --     Minutes 15 -- --     METs 5 -- --       Rower   Level 5 -- --     Watts 55 -- --     Minutes 15 -- --       Oxygen   Maintain Oxygen Saturation 88% or higher 88% or higher 88% or higher        Exercise Comments:   Exercise Comments     Row Name 10/24/23 0805           Exercise Comments First full day of exercise!  Patient was oriented to gym and equipment including functions, settings, policies, and procedures.  Patient's individual exercise prescription and treatment plan were reviewed.  All starting workloads were established based on the results of the 6 minute walk test done at initial orientation visit.  The plan for exercise progression was also introduced and progression will be customized based on patient's performance and goals.          Exercise Goals and Review:   Exercise Goals     Row Name 10/19/23 1135             Exercise Goals   Increase Physical Activity Yes       Intervention Provide advice, education, support and counseling about physical activity/exercise needs.;Develop an individualized exercise prescription for aerobic and resistive training based on initial evaluation findings, risk stratification, comorbidities and participant's personal goals.       Expected Outcomes Short Term: Attend rehab on a regular basis to increase amount of physical activity.;Long Term: Add in home exercise to make exercise part of routine and to increase amount of physical activity.;Long Term: Exercising regularly at least 3-5 days a week.       Increase Strength and Stamina Yes       Intervention Provide advice,  education, support and counseling about physical activity/exercise needs.;Develop an individualized exercise prescription for aerobic and resistive training based on initial evaluation findings, risk stratification, comorbidities and participant's personal goals.       Expected Outcomes Short Term: Increase workloads from initial exercise prescription for resistance, speed, and METs.;Short Term: Perform resistance training exercises routinely during rehab and add in resistance training at home;Long Term: Improve cardiorespiratory fitness, muscular endurance and strength as measured by increased METs and functional capacity ( )       Able to understand and use rate of perceived exertion (RPE) scale Yes       Intervention Provide education and explanation on how to use RPE scale       Expected Outcomes Short Term: Able to use RPE daily in rehab to express subjective intensity level;Long Term:  Able to use RPE to guide intensity level when exercising independently       Able to understand and use Dyspnea scale Yes       Intervention Provide education and explanation on how to use Dyspnea scale       Expected Outcomes Short Term: Able to use Dyspnea scale daily in rehab to express subjective sense of shortness of breath during exertion;Long Term: Able to use Dyspnea scale to guide intensity level when exercising independently       Knowledge and understanding of Target Heart Rate Range (THRR) Yes       Intervention Provide education and explanation of THRR including how the numbers were predicted and where they are located  for reference       Expected Outcomes Long Term: Able to use THRR to govern intensity when exercising independently;Short Term: Able to state/look up THRR;Short Term: Able to use daily as guideline for intensity in rehab       Able to check pulse independently Yes       Intervention Provide education and demonstration on how to check pulse in carotid and radial arteries.;Review the  importance of being able to check your own pulse for safety during independent exercise       Expected Outcomes Short Term: Able to explain why pulse checking is important during independent exercise;Long Term: Able to check pulse independently and accurately       Understanding of Exercise Prescription Yes       Intervention Provide education, explanation, and written materials on patient's individual exercise prescription       Expected Outcomes Short Term: Able to explain program exercise prescription;Long Term: Able to explain home exercise prescription to exercise independently          Exercise Goals Re-Evaluation :  Exercise Goals Re-Evaluation     Row Name 10/24/23 0805 11/10/23 1434 11/24/23 1759 12/08/23 1627 12/12/23 0751     Exercise Goal Re-Evaluation   Exercise Goals Review Able to understand and use rate of perceived exertion (RPE) scale;Knowledge and understanding of Target Heart Rate Range (THRR);Understanding of Exercise Prescription;Able to understand and use Dyspnea scale Increase Strength and Stamina;Increase Physical Activity;Understanding of Exercise Prescription Increase Strength and Stamina;Increase Physical Activity;Understanding of Exercise Prescription Increase Strength and Stamina;Increase Physical Activity;Understanding of Exercise Prescription Increase Physical Activity;Increase Strength and Stamina;Understanding of Exercise Prescription   Comments Reviewed RPE and dyspnea scale, THR and program prescription with pt today.  Pt voiced understanding and was given a copy of goals to take home. Ryan English is off to a good start in the program. He did well on the treadmill at a speed of 2.5 mph with an incline of 1%. He also improved to level 6 on the T4 nustep and level 5 on the T5 nustep. We will continue to monitor his progress in the program. Ryan English is doing well in rehab. He increased his workload on the treadmill to a speed of 2.5 mph and incline of 1.5%. He also  increased to level 4 on the XR and level 6 on the T5 nustep. We will continue to monitor his progress in the program. Ryan English continues to do well in rehab. He continues to work on the treadmill at a speed of 2.5 mph with an incline of 1%. He also improved to level 10 on the XR and level 7 on the T6 nustep. We will continue to monitor his progress in the program. Ryan English is doing well in rehab. He is walking some at home, most of his activity is yard work.   Expected Outcomes Short: Use RPE daily to regulate intensity. Long: Follow program prescription in THR. Short: Continue to follow current exercise prescription. Long: Continue exercise to improve strength and stamina. Short: Continue to progressively increase treadmill and XR workloads. Long: Continue exercise to improve strength and stamina. Short: Continue to progressively increase workloads. Long: Continue exercise to improve strength and stamina. STG: Continue to attended rehab and increase workloads as able.    Row Name 12/21/23 1437 01/03/24 1514 01/17/24 1522 01/31/24 1339       Exercise Goal Re-Evaluation   Exercise Goals Review Increase Physical Activity;Increase Strength and Stamina;Understanding of Exercise Prescription Increase Physical Activity;Increase Strength and Stamina;Understanding of  Exercise Prescription Increase Physical Activity;Increase Strength and Stamina;Understanding of Exercise Prescription Increase Physical Activity;Increase Strength and Stamina;Understanding of Exercise Prescription    Comments Ryan English continues to do well in rehab. He was able to maintain his workload on the treadmill at a speed of 2.22mph. He was also able to use the XR at level 8. We will continue to monitor his progress in the program. Ryan English has only attended two sessions since the last review. He was able to increase his treadmill workload to a speed of 2.5 mph with an incline of 3.5%. He also began using the biostep at level 8 and the rower at  level 5. We will continue to monitor his progress in the program. Ryan English is doing well in rehab. He was only able to attend one session during this review period. During his session he was able to increase his speed on the treadmill from 2.5 to 2.63mph. We will continue to monitor his progress in the program. Ryan English is doing well in rehab but has only attended two sessions. He did increase his workload on the treadmill to a speed of 2.5 mph with an incline of 4.5%. He also continues to work at level 7 on the XR. We will continue to monitor his progress in the program.    Expected Outcomes Short: Try to increase treadmill incline to 2%. Long: Continue exercise to improve strength and stamina. Short: Attend rehab more consistently. Long: Continue exercise to improve strength and stamina. Short: Continue to increase treadmill workload, attend rehab more regularly. Long: Continue exercise to improve strength and stamina. Short: Attend rehab more regularly. Long: Continue exercise to improve strength and stamina.       Discharge Exercise Prescription (Final Exercise Prescription Changes):  Exercise Prescription Changes - 01/31/24 1300       Response to Exercise   Blood Pressure (Admit) 104/62    Blood Pressure (Exit) 122/66    Heart Rate (Admit) 55 bpm    Heart Rate (Exercise) 115 bpm    Heart Rate (Exit) 84 bpm    Rating of Perceived Exertion (Exercise) 14    Symptoms none    Duration Continue with 30 min of aerobic exercise without signs/symptoms of physical distress.    Intensity THRR unchanged      Progression   Progression Continue to progress workloads to maintain intensity without signs/symptoms of physical distress.    Average METs 4.46      Resistance Training   Training Prescription Yes    Weight 10 lb    Reps 10-15      Interval Training   Interval Training No      Treadmill   MPH 2.5    Grade 4.5    Minutes 15    METs 4.47      REL-XR   Level 7    Minutes 15    METs  5.3      Oxygen   Maintain Oxygen Saturation 88% or higher          Nutrition:  Target Goals: Understanding of nutrition guidelines, daily intake of sodium 1500mg , cholesterol 200mg , calories 30% from fat and 7% or less from saturated fats, daily to have 5 or more servings of fruits and vegetables.  Education: All About Nutrition: -Group instruction provided by verbal, written material, interactive activities, discussions, models, and posters to present general guidelines for heart healthy nutrition including fat, fiber, MyPlate, the role of sodium in heart healthy nutrition, utilization of the nutrition label, and utilization of  this knowledge for meal planning. Follow up email sent as well. Written material given at graduation. Flowsheet Row Cardiac Rehab from 12/08/2023 in [facility] Cardiac and Pulmonary Rehab  Date 12/08/23  Educator JG part 1  Instruction Review Code 1- Verbalizes Understanding    Biometrics:  Pre Biometrics - 10/19/23 1136       Pre Biometrics   Height 5' 10.5 (1.791 m)    Weight 208 lb 4.8 oz (94.5 kg)    Waist Circumference 39 inches    Hip Circumference 40 inches    Waist to Hip Ratio 0.98 %    BMI (Calculated) 29.46    Single Leg Stand 6.55 seconds           Nutrition Therapy Plan and Nutrition Goals:  Nutrition Therapy & Goals - 10/19/23 1130       Nutrition Therapy   RD appointment deferred Yes      Intervention Plan   Intervention Prescribe, educate and counsel regarding individualized specific dietary modifications aiming towards targeted core components such as weight, hypertension, lipid management, diabetes, heart failure and other comorbidities.    Expected Outcomes Short Term Goal: Understand basic principles of dietary content, such as calories, fat, sodium, cholesterol and nutrients.;Short Term Goal: A plan has been developed with personal nutrition goals set during dietitian appointment.;Long Term Goal: Adherence to prescribed  nutrition plan.          Nutrition Assessments:  MEDIFICTS Score Key: >=70 Need to make dietary changes  40-70 Heart Healthy Diet <= 40 Therapeutic Level Cholesterol Diet   Picture Your Plate Scores: <59 Unhealthy dietary pattern with much room for improvement. 41-50 Dietary pattern unlikely to meet recommendations for good health and room for improvement. 51-60 More healthful dietary pattern, with some room for improvement.  >60 Healthy dietary pattern, although there may be some specific behaviors that could be improved.    Nutrition Goals Re-Evaluation:  Nutrition Goals Re-Evaluation     Row Name 12/12/23 0753             Goals   Comment Deferred RD appointment          Nutrition Goals Discharge (Final Nutrition Goals Re-Evaluation):  Nutrition Goals Re-Evaluation - 12/12/23 0753       Goals   Comment Deferred RD appointment          Psychosocial: Target Goals: Acknowledge presence or absence of significant depression and/or stress, maximize coping skills, provide positive support system. Participant is able to verbalize types and ability to use techniques and skills needed for reducing stress and depression.   Education: Stress, Anxiety, and Depression - Group verbal and visual presentation to define topics covered.  Reviews how body is impacted by stress, anxiety, and depression.  Also discusses healthy ways to reduce stress and to treat/manage anxiety and depression.  Written material given at graduation. Flowsheet Row Cardiac Rehab from 12/08/2023 in [facility] Cardiac and Pulmonary Rehab  Date 11/10/23  Educator SB  Instruction Review Code 1- [employer] Understanding    Education: Sleep Hygiene -Provides group verbal and written instruction about how sleep can affect your health.  Define sleep hygiene, discuss sleep cycles and impact of sleep habits. Review good sleep hygiene tips.    Initial Review & Psychosocial Screening:  Initial Psych Review &  Screening - 10/12/23 1426       Initial Review   Current issues with None Identified      Family Dynamics   Good Support System? Yes    Comments  He can look to his wife and two sons for support. He states no issues with his mental state and does not take any medication for his mood.      Barriers   Psychosocial barriers to participate in program The patient should benefit from training in stress management and relaxation.;There are no identifiable barriers or psychosocial needs.      Screening Interventions   Interventions Provide feedback about the scores to participant;Program counselor consult;To provide support and resources with identified psychosocial needs    Expected Outcomes Short Term goal: Utilizing psychosocial counselor, staff and physician to assist with identification of specific Stressors or current issues interfering with healing process. Setting desired goal for each stressor or current issue identified.;Long Term Goal: Stressors or current issues are controlled or eliminated.;Short Term goal: Identification and review with participant of any Quality of Life or Depression concerns found by scoring the questionnaire.;Long Term goal: The participant improves quality of Life and PHQ9 Scores as seen by post scores and/or verbalization of changes          Quality of Life Scores:   Scores of 19 and below usually indicate a poorer quality of life in these areas.  A difference of  2-3 points is a clinically meaningful difference.  A difference of 2-3 points in the total score of the Quality of Life Index has been associated with significant improvement in overall quality of life, self-image, physical symptoms, and general health in studies assessing change in quality of life.  PHQ-9: Review Flowsheet  More data exists      01/24/2024 10/19/2023 08/18/2023 02/17/2023 01/19/2023  Depression screen PHQ 2/9  Decreased Interest 0 0 0 0 0  Down, Depressed, Hopeless 0 0 0 0 0  PHQ - 2  Score 0 0 0 0 0  Altered sleeping 0 0 - - -  Tired, decreased energy 0 0 - - -  Change in appetite 0 0 - - -  Feeling bad or failure about yourself  0 0 - - -  Trouble concentrating 0 0 - - -  Moving slowly or fidgety/restless 0 0 - - -  Suicidal thoughts 0 0 - - -  PHQ-9 Score 0 0 - - -  Difficult doing work/chores Not difficult at all - - - -   Interpretation of Total Score  Total Score Depression Severity:  1-4 = Minimal depression, 5-9 = Mild depression, 10-14 = Moderate depression, 15-19 = Moderately severe depression, 20-27 = Severe depression   Psychosocial Evaluation and Intervention:  Psychosocial Evaluation - 10/12/23 1427       Psychosocial Evaluation & Interventions   Interventions Encouraged to exercise with the program and follow exercise prescription;Relaxation education;Stress management education    Comments He can look to his wife and two sons for support. He states no issues with his mental state and does not take any medication for his mood.    Expected Outcomes Short: Start [facility] to help with mood. Long: Maintain a healthy mental state    Continue Psychosocial Services  Follow up required by staff          Psychosocial Re-Evaluation:  Psychosocial Re-Evaluation     Row Name 12/12/23 [phone]             Psychosocial Re-Evaluation   Current issues with None Identified       Comments Ryan English reports he is sleeping well, 6-8hr each night. Denies any stress, anxiety, or depression.       Expected Outcomes  STG: Continue to attended rehab. LTG: Achieve and maintain a positive outlook on health and daily life       Interventions Encouraged to attend Cardiac Rehabilitation for the exercise       Continue Psychosocial Services  Follow up required by staff          Psychosocial Discharge (Final Psychosocial Re-Evaluation):  Psychosocial Re-Evaluation - 12/12/23 0752       Psychosocial Re-Evaluation   Current issues with None Identified    Comments  Ryan English reports he is sleeping well, 6-8hr each night. Denies any stress, anxiety, or depression.    Expected Outcomes STG: Continue to attended rehab. LTG: Achieve and maintain a positive outlook on health and daily life    Interventions Encouraged to attend Cardiac Rehabilitation for the exercise    Continue Psychosocial Services  Follow up required by staff          Vocational Rehabilitation: Provide vocational rehab assistance to qualifying candidates.   Vocational Rehab Evaluation & Intervention:   Education: Education Goals: Education classes will be provided on a variety of topics geared toward better understanding of heart health and risk factor modification. Participant will state understanding/return demonstration of topics presented as noted by education test scores.  Learning Barriers/Preferences:  Learning Barriers/Preferences - 10/12/23 1426       Learning Barriers/Preferences   Learning Barriers None    Learning Preferences None          General Cardiac Education Topics:  AED/CPR: - Group verbal and written instruction with the use of models to demonstrate the basic use of the AED with the basic ABC's of resuscitation.   Anatomy and Cardiac Procedures: - Group verbal and visual presentation and models provide information about basic cardiac anatomy and function. Reviews the testing methods done to diagnose heart disease and the outcomes of the test results. Describes the treatment choices: Medical Management, Angioplasty, or Coronary Bypass Surgery for treating various heart conditions including Myocardial Infarction, Angina, Valve Disease, and Cardiac Arrhythmias.  Written material given at graduation.   Medication Safety: - Group verbal and visual instruction to review commonly prescribed medications for heart and lung disease. Reviews the medication, class of the drug, and side effects. Includes the steps to properly store meds and maintain the prescription  regimen.  Written material given at graduation.   Intimacy: - Group verbal instruction through game format to discuss how heart and lung disease can affect sexual intimacy. Written material given at graduation.. Flowsheet Row Cardiac Rehab from 12/08/2023 in [facility] Cardiac and Pulmonary Rehab  Date 12/01/23  Educator MB  Instruction Review Code 1- Verbalizes Understanding    Know Your Numbers and Heart Failure: - Group verbal and visual instruction to discuss disease risk factors for cardiac and pulmonary disease and treatment options.  Reviews associated critical values for Overweight/Obesity, Hypertension, Cholesterol, and Diabetes.  Discusses basics of heart failure: signs/symptoms and treatments.  Introduces Heart Failure Zone chart for action plan for heart failure.  Written material given at graduation.   Infection Prevention: - Provides verbal and written material to individual with discussion of infection control including proper hand washing and proper equipment cleaning during exercise session. Flowsheet Row Cardiac Rehab from 12/08/2023 in [facility] Cardiac and Pulmonary Rehab  Date 10/12/23  Educator jh  Instruction Review Code 1- Verbalizes Understanding    Falls Prevention: - Provides verbal and written material to individual with discussion of falls prevention and safety. Flowsheet Row Cardiac Rehab from 12/08/2023 in [facility] Cardiac and Pulmonary  Rehab  Date 10/12/23  Educator jh  Instruction Review Code 1- Verbalizes Understanding    Other: -Provides group and verbal instruction on various topics (see comments)   Knowledge Questionnaire Score:   Core Components/Risk Factors/Patient Goals at Admission:  Personal Goals and Risk Factors at Admission - 10/12/23 1425       Core Components/Risk Factors/Patient Goals on Admission    Weight Management Yes;Weight Maintenance;Weight Loss    Intervention Weight Management: Develop a combined nutrition and exercise program designed  to reach desired caloric intake, while maintaining appropriate intake of nutrient and fiber, sodium and fats, and appropriate energy expenditure required for the weight goal.;Weight Management: Provide education and appropriate resources to help participant work on and attain dietary goals.;Weight Management/Obesity: Establish reasonable short term and long term weight goals.    Expected Outcomes Short Term: Continue to assess and modify interventions until short term weight is achieved;Weight Maintenance: Understanding of the daily nutrition guidelines, which includes 25-35% calories from fat, 7% or less cal from saturated fats, less than 200mg  cholesterol, less than 1.5gm of sodium, & 5 or more servings of fruits and vegetables daily;Understanding recommendations for meals to include 15-35% energy as protein, 25-35% energy from fat, 35-60% energy from carbohydrates, less than 200mg  of dietary cholesterol, 20-35 gm of total fiber daily;Weight Loss: Understanding of general recommendations for a balanced deficit meal plan, which promotes 1-2 lb weight loss per week and includes a negative energy balance of [phone] kcal/d;Understanding of distribution of calorie intake throughout the day with the consumption of 4-5 meals/snacks    Heart Failure Yes    Intervention Provide a combined exercise and nutrition program that is supplemented with education, support and counseling about heart failure. Directed toward relieving symptoms such as shortness of breath, decreased exercise tolerance, and extremity edema.    Expected Outcomes Improve functional capacity of life;Short term: Attendance in program 2-3 days a week with increased exercise capacity. Reported lower sodium intake. Reported increased fruit and vegetable intake. Reports medication compliance.;Short term: Daily weights obtained and reported for increase. Utilizing diuretic protocols set by physician.;Long term: Adoption of self-care skills and reduction of  barriers for early signs and symptoms recognition and intervention leading to self-care maintenance.    Hypertension Yes    Intervention Provide education on lifestyle modifcations including regular physical activity/exercise, weight management, moderate sodium restriction and increased consumption of fresh fruit, vegetables, and low fat dairy, alcohol moderation, and smoking cessation.;Monitor prescription use compliance.    Expected Outcomes Short Term: Continued assessment and intervention until BP is < 140/65mm HG in hypertensive participants. < 130/8mm HG in hypertensive participants with diabetes, heart failure or chronic kidney disease.;Long Term: Maintenance of blood pressure at goal levels.    Lipids Yes    Intervention Provide education and support for participant on nutrition & aerobic/resistive exercise along with prescribed medications to achieve LDL 70mg , HDL >40mg .    Expected Outcomes Short Term: Participant states understanding of desired cholesterol values and is compliant with medications prescribed. Participant is following exercise prescription and nutrition guidelines.;Long Term: Cholesterol controlled with medications as prescribed, with individualized exercise RX and with personalized nutrition plan. Value goals: LDL < 70mg , HDL > 40 mg.          Education:Diabetes - Individual verbal and written instruction to review signs/symptoms of diabetes, desired ranges of glucose level fasting, after meals and with exercise. Acknowledge that pre and post exercise glucose checks will be done for 3 sessions at entry of program.   Core  Components/Risk Factors/Patient Goals Review:   Goals and Risk Factor Review     Row Name 12/12/23 0753             Core Components/Risk Factors/Patient Goals Review   Personal Goals Review Hypertension       Review Ryan English has a BP cuff at home and checks his BP daily, reports it is consistent and matches readings taken here at rehab.        Expected Outcomes STG: Continue to check BP at home. LTG: Manage risk factors independently          Core Components/Risk Factors/Patient Goals at Discharge (Final Review):   Goals and Risk Factor Review - 12/12/23 0753       Core Components/Risk Factors/Patient Goals Review   Personal Goals Review Hypertension    Review Ryan English has a BP cuff at home and checks his BP daily, reports it is consistent and matches readings taken here at rehab.    Expected Outcomes STG: Continue to check BP at home. LTG: Manage risk factors independently          ITP Comments:  ITP Comments     Row Name 10/12/23 1425 10/19/23 1129 10/24/23 0804 11/09/23 1311 12/07/23 1107   ITP Comments Virtual Visit completed. Patient informed on EP and RD appointment and 6 Minute walk test. Patient also informed of patient health questionnaires on My Chart. Patient Verbalizes understanding. Visit diagnosis can be found in [facility] 09/23/2023. Patient is to bring in medication list. Completed and gym orientation for cardiac rehab. Initial ITP created and sent for review to Dr. Oneil Pinal, Medical Director. First full day of exercise!  Patient was oriented to gym and equipment including functions, settings, policies, and procedures.  Patient's individual exercise prescription and treatment plan were reviewed.  All starting workloads were established based on the results of the 6 minute walk test done at initial orientation visit.  The plan for exercise progression was also introduced and progression will be customized based on patient's performance and goals. 30 Day review completed. Medical Director ITP review done, changes made as directed, and signed approval by Medical Director. New to program. 30 Day review completed. Medical Director ITP review done, changes made as directed, and signed approval by Medical Director.    Row Name 01/04/24 1115 02/01/24 1013         ITP Comments 30 Day review completed. Medical Director  ITP review done, changes made as directed, and signed approval by Medical Director. 30 Day review completed. Medical Director ITP review done, changes made as directed, and signed approval by Medical Director.         Comments: 30 day review

## 2024-02-01 NOTE — Progress Notes (Signed)
 Daily Session Note  Patient Details  Name: Ryan English MRN: [id number] Date of Birth: 1950/09/22 Referring Provider:   Flowsheet Row Cardiac Rehab from 10/19/2023 in [facility] Cardiac and Pulmonary Rehab  Referring Provider Neita Breeding, MD    Encounter Date: 02/01/2024  Check In:  Session Check In - 02/01/24 1614       Check-In   Supervising physician immediately available to respond to emergencies See telemetry face sheet for immediately available ER MD    Location [facility]-Cardiac & Pulmonary Rehab    Staff Present Hoy Rodney RN,BSN;Joseph [facility] Dyane BS, ACSM CEP, Exercise Physiologist;Kelly Bollinger [facility]    Virtual Visit No    Medication changes reported     No    Fall or balance concerns reported    No    Warm-up and Cool-down Performed on first and last piece of equipment    Resistance Training Performed Yes    VAD Patient? No    PAD/SET Patient? No      Pain Assessment   Currently in Pain? No/denies             Social History   Tobacco Use  Smoking Status Never  Smokeless Tobacco Never    Goals Met:  Independence with exercise equipment Exercise tolerated well No report of concerns or symptoms today Strength training completed today  Goals Unmet:  Not Applicable  Comments: Pt able to follow exercise prescription today without complaint.  Will continue to monitor for progression.    Dr. Oneil Pinal is Medical Director for [facility] Cardiac Rehabilitation.  Dr. Fuad Aleskerov is Medical Director for [facility] Pulmonary Rehabilitation.

## 2024-02-02 ENCOUNTER — Encounter

## 2024-02-03 ENCOUNTER — Telehealth: Payer: Self-pay

## 2024-02-03 ENCOUNTER — Ambulatory Visit

## 2024-02-03 NOTE — Progress Notes (Signed)
   02/03/2024  Patient ID: Ryan English, male   DOB: [birth year], 73 y.o.   MRN: [id number]  This patient is appearing on a report for being at risk of failing the adherence measure for identified medications this calendar year.   Medication Adherence Summary (STAR/HEDIS Monitoring): Adherence Category: cholesterol (statin)    Drug Name: Atorvastatin 40 mg  Last Fill:01/04/2024 Days' Supply: 30     Notes: ? Adherence data pulled from pharmacy claims portal Dr. Annemarie. ? Plan: MyChart message sent to patient.  Dorcas Solian, PharmD Clinical Pharmacist Cell: [phone]

## 2024-02-06 ENCOUNTER — Encounter

## 2024-02-06 ENCOUNTER — Encounter: Attending: Cardiology | Admitting: *Deleted

## 2024-02-06 DIAGNOSIS — Z955 Presence of coronary angioplasty implant and graft: Secondary | ICD-10-CM | POA: Insufficient documentation

## 2024-02-06 NOTE — Progress Notes (Signed)
 Daily Session Note  Patient Details  Name: Ryan English MRN: [id number] Date of Birth: 1951/07/05 Referring Provider:   Flowsheet Row Cardiac Rehab from 10/19/2023 in [facility] Cardiac and Pulmonary Rehab  Referring Provider Neita Breeding, MD    Encounter Date: 02/06/2024  Check In:  Session Check In - 02/06/24 1617       Check-In   Supervising physician immediately available to respond to emergencies See telemetry face sheet for immediately available ER MD    Location [facility]-Cardiac & Pulmonary Rehab    Staff Present Rollene Paterson, MS, Exercise Physiologist;Laureen Delores, BS, RRT, CPFT;Gerik Coberly Tressa RN,BSN;Joseph Rolinda RCP,RRT,BSRT    Virtual Visit No    Medication changes reported     No    Fall or balance concerns reported    No    Warm-up and Cool-down Performed on first and last piece of equipment    Resistance Training Performed Yes    VAD Patient? No    PAD/SET Patient? No      Pain Assessment   Currently in Pain? No/denies             Social History   Tobacco Use  Smoking Status Never  Smokeless Tobacco Never    Goals Met:  Independence with exercise equipment Exercise tolerated well No report of concerns or symptoms today Strength training completed today  Goals Unmet:  Not Applicable  Comments: Pt able to follow exercise prescription today without complaint.  Will continue to monitor for progression.    Dr. Oneil Pinal is Medical Director for [facility] Cardiac Rehabilitation.  Dr. Fuad Aleskerov is Medical Director for [facility] Pulmonary Rehabilitation.

## 2024-02-07 ENCOUNTER — Encounter

## 2024-02-09 ENCOUNTER — Encounter

## 2024-02-10 ENCOUNTER — Encounter: Admitting: *Deleted

## 2024-02-10 DIAGNOSIS — Z955 Presence of coronary angioplasty implant and graft: Secondary | ICD-10-CM

## 2024-02-10 NOTE — Progress Notes (Signed)
 Daily Session Note  Patient Details  Name: Ryan English MRN: [id number] Date of Birth: Dec 27, 1950 Referring Provider:   Flowsheet Row Cardiac Rehab from 10/19/2023 in [facility] Cardiac and Pulmonary Rehab  Referring Provider Neita Breeding, MD    Encounter Date: 02/10/2024  Check In:  Session Check In - 02/10/24 0947       Check-In   Supervising physician immediately available to respond to emergencies See telemetry face sheet for immediately available ER MD    Location [facility]-Cardiac & Pulmonary Rehab    Staff Present Othel Durand, RN, BSN, CCRP;Meredith Tressa RN,BSN;Joseph Hood RCP,RRT,BSRT;Maxon Viola BS, Exercise Physiologist    Virtual Visit No    Medication changes reported     No    Fall or balance concerns reported    No    Warm-up and Cool-down Performed on first and last piece of equipment    Resistance Training Performed Yes    VAD Patient? No    PAD/SET Patient? No      Pain Assessment   Currently in Pain? No/denies             Social History   Tobacco Use  Smoking Status Never  Smokeless Tobacco Never    Goals Met:  Independence with exercise equipment Exercise tolerated well No report of concerns or symptoms today  Goals Unmet:  Not Applicable  Comments: Pt able to follow exercise prescription today without complaint.  Will continue to monitor for progression.    Dr. Oneil Pinal is Medical Director for [facility] Cardiac Rehabilitation.  Dr. Fuad Aleskerov is Medical Director for [facility] Pulmonary Rehabilitation.

## 2024-02-13 ENCOUNTER — Encounter

## 2024-02-14 ENCOUNTER — Encounter

## 2024-02-16 ENCOUNTER — Encounter: Payer: Medicare Other | Admitting: Family Medicine

## 2024-02-16 ENCOUNTER — Encounter

## 2024-02-17 ENCOUNTER — Ambulatory Visit

## 2024-02-20 ENCOUNTER — Ambulatory Visit (INDEPENDENT_AMBULATORY_CARE_PROVIDER_SITE_OTHER): Admitting: Family Medicine

## 2024-02-20 ENCOUNTER — Encounter: Admitting: Emergency Medicine

## 2024-02-20 ENCOUNTER — Encounter: Payer: Self-pay | Admitting: Family Medicine

## 2024-02-20 VITALS — BP 116/76 | HR 75 | Ht 71.0 in | Wt 204.0 lb

## 2024-02-20 DIAGNOSIS — I25118 Atherosclerotic heart disease of native coronary artery with other forms of angina pectoris: Secondary | ICD-10-CM

## 2024-02-20 DIAGNOSIS — R1319 Other dysphagia: Secondary | ICD-10-CM

## 2024-02-20 DIAGNOSIS — R739 Hyperglycemia, unspecified: Secondary | ICD-10-CM

## 2024-02-20 DIAGNOSIS — Z955 Presence of coronary angioplasty implant and graft: Secondary | ICD-10-CM | POA: Diagnosis not present

## 2024-02-20 DIAGNOSIS — I10 Essential (primary) hypertension: Secondary | ICD-10-CM

## 2024-02-20 DIAGNOSIS — R09A2 Foreign body sensation, throat: Secondary | ICD-10-CM

## 2024-02-20 DIAGNOSIS — Z0000 Encounter for general adult medical examination without abnormal findings: Secondary | ICD-10-CM | POA: Diagnosis not present

## 2024-02-20 DIAGNOSIS — E782 Mixed hyperlipidemia: Secondary | ICD-10-CM | POA: Diagnosis not present

## 2024-02-20 DIAGNOSIS — Z125 Encounter for screening for malignant neoplasm of prostate: Secondary | ICD-10-CM | POA: Diagnosis not present

## 2024-02-20 MED ORDER — OMEPRAZOLE 20 MG PO CPDR: 20.0000 mg | DELAYED_RELEASE_CAPSULE | Freq: Every day | ORAL | 3 refills | Status: DC

## 2024-02-20 NOTE — Progress Notes (Signed)
 Daily Session Note  Patient Details  Name: Ryan English MRN: [id number] Date of Birth: 1950-07-15 Referring Provider:   Flowsheet Row Cardiac Rehab from 10/19/2023 in [facility] Cardiac and Pulmonary Rehab  Referring Provider Neita Breeding, MD    Encounter Date: 02/20/2024  Check In:  Session Check In - 02/20/24 1355       Check-In   Supervising physician immediately available to respond to emergencies See telemetry face sheet for immediately available ER MD    Location [facility]-Cardiac & Pulmonary Rehab    Staff Present Othel Durand, RN, BSN, CCRP;Maxon Conetta BS, Exercise Physiologist;Lauren Briseis Aguilera RN,BSN;Noah Tickle, BS, Exercise Physiologist    Virtual Visit No    Medication changes reported     No    Fall or balance concerns reported    No    Warm-up and Cool-down Performed on first and last piece of equipment    Resistance Training Performed Yes    VAD Patient? No    PAD/SET Patient? No      Pain Assessment   Currently in Pain? No/denies             Social History   Tobacco Use  Smoking Status Never  Smokeless Tobacco Never    Goals Met:  Independence with exercise equipment Exercise tolerated well No report of concerns or symptoms today Strength training completed today  Goals Unmet:  Not Applicable  Comments: Pt able to follow exercise prescription today without complaint.  Will continue to monitor for progression.    Dr. Oneil Pinal is Medical Director for [facility] Cardiac Rehabilitation.  Dr. Fuad Aleskerov is Medical Director for [facility] Pulmonary Rehabilitation.

## 2024-02-20 NOTE — Progress Notes (Unsigned)
 Complete physical exam   [name]   DOB: [birth year]   73 y.o. Male  MRN: [id number] Visit Date: 02/20/2024  Today's healthcare provider: Jon Eva, MD   Chief Complaint  Patient presents with  . Care Management    Pattern of eating:general   Are you exercising: yes  What type of exercising: walking and weight lifting   How long:30 min   How frequent: 3 to 4 times a week    Vaccine: Flu -vaccine not in clinic      . Annual Exam   Subjective    Ryan English is a 73 y.o. male who presents today for a complete physical exam.   Discussed the use of AI scribe software for clinical note transcription with the patient, who gave verbal consent to proceed.  History of Present Illness            Last depression screening scores    02/20/2024    3:42 PM 01/24/2024   11:00 AM 10/19/2023   11:31 AM  PHQ 2/9 Scores  PHQ - 2 Score 0 0 0  PHQ- 9 Score 0 0 0   Last fall risk screening    01/23/2024   10:47 PM  Fall Risk   Falls in the past year? 0  Number falls in past yr: 0  Injury with Fall? 0  Risk for fall due to : No Fall Risks  Follow up Falls evaluation completed;Falls prevention discussed    {VISON DENTAL STD PSA (Optional):27386}  {History (Optional):23778}  Medications: Outpatient Medications Prior to Visit  Medication Sig  . acetaminophen  (TYLENOL ) 500 MG tablet Take 1,000 mg by mouth 2 (two) times daily.  . acetaminophen  (TYLENOL ) 500 MG tablet Take by mouth.  . albuterol  (VENTOLIN  HFA) 108 (90 Base) MCG/ACT inhaler TAKE 2 PUFFS BY MOUTH EVERY 6 HOURS AS NEEDED FOR WHEEZE OR SHORTNESS OF BREATH  . amLODipine  (NORVASC ) 2.5 MG tablet Take 1 tablet (2.5 mg total) by mouth daily.  . aspirin  81 MG tablet Take 81 mg by mouth daily.  . aspirin  EC 81 MG tablet Take by mouth.  SABRA atorvastatin (LIPITOR) 40 MG tablet Take 40 mg by mouth daily.  SABRA Dextromethorphan HBr (DELSYM PO) Take by mouth as needed.  . ezetimibe  (ZETIA ) 10 MG  tablet Take 1 tablet (10 mg total) by mouth daily.  . famotidine (PEPCID) 20 MG tablet Take 20 mg by mouth 2 (two) times daily. Maximum strenght  . famotidine (PEPCID) 20 MG tablet Take by mouth.  SABRA ipratropium (ATROVENT  HFA) 17 MCG/ACT inhaler Inhale into the lungs.  SABRA ipratropium (ATROVENT ) 0.03 % nasal spray Place 2 sprays into both nostrils every 12 (twelve) hours.  . loratadine  (CLARITIN  REDITABS) 10 MG dissolvable tablet Dissolve 1 tablet (10 mg total) in the mouth daily.  . loratadine  (CLARITIN ) 10 MG tablet Take 1 tablet (10 mg total) by mouth daily.  . losartan  (COZAAR ) 50 MG tablet Take 1 tablet by mouth daily.  . losartan  (COZAAR ) 50 MG tablet Take 1 tablet (50 mg total) by mouth daily.  . Misc Natural Products (OSTEO BI-FLEX TRIPLE STRENGTH) TABS Take 2 tablets by mouth daily.  . montelukast  (SINGULAIR ) 10 MG tablet TAKE 1 TABLET BY MOUTH NIGHTLY  . montelukast  (SINGULAIR ) 10 MG tablet Take by mouth.  . nitroGLYCERIN  (NITROSTAT ) 0.4 MG SL tablet Place under the tongue.  . prasugrel (EFFIENT) 10 MG TABS tablet Take 1 tablet by mouth daily.  . Pseudoephedrine-guaiFENesin (MUCINEX D  PO) Take by mouth as needed.   No facility-administered medications prior to visit.    Review of Systems {Insert previous labs (optional):23779} {See past labs  Heme  Chem  Endocrine  Serology  Results Review (optional):1}  Objective    BP 116/76   Pulse 75   Ht 5' 11 (1.803 m)   Wt 204 lb (92.5 kg)   SpO2 96%   BMI 28.45 kg/m  {Insert last BP/Wt (optional):23777}{See vitals history (optional):1}  Physical Exam Vitals reviewed.  Constitutional:      General: He is not in acute distress.    Appearance: Normal appearance. He is well-developed. He is not diaphoretic.  HENT:     Head: Normocephalic and atraumatic.     Right Ear: Tympanic membrane, ear canal and external ear normal.     Left Ear: Tympanic membrane, ear canal and external ear normal.     Nose: Nose normal.      Mouth/Throat:     Mouth: Mucous membranes are moist.     Pharynx: Oropharynx is clear. No oropharyngeal exudate.  Eyes:     General: No scleral icterus.    Conjunctiva/sclera: Conjunctivae normal.     Pupils: Pupils are equal, round, and reactive to light.  Neck:     Thyroid: No thyromegaly.  Cardiovascular:     Rate and Rhythm: Normal rate and regular rhythm.     Heart sounds: Normal heart sounds. No murmur heard. Pulmonary:     Effort: Pulmonary effort is normal. No respiratory distress.     Breath sounds: Normal breath sounds. No wheezing or rales.  Abdominal:     General: There is no distension.     Palpations: Abdomen is soft.     Tenderness: There is no abdominal tenderness.  Musculoskeletal:        General: No deformity.     Cervical back: Neck supple.     Right lower leg: No edema.     Left lower leg: No edema.  Lymphadenopathy:     Cervical: No cervical adenopathy.  Skin:    General: Skin is warm and dry.     Findings: No rash.  Neurological:     Mental Status: He is alert and oriented to person, place, and time. Mental status is at baseline.     Gait: Gait normal.  Psychiatric:        Mood and Affect: Mood normal.        Behavior: Behavior normal.        Thought Content: Thought content normal.      No results found for any visits on 02/20/24.  Assessment & Plan    Routine Health Maintenance and Physical Exam  Exercise Activities and Dietary recommendations  Goals     . DIET - EAT MORE FRUITS AND VEGETABLES        Immunization History  Administered Date(s) Administered  . Fluad Quad(high Dose 65+) 02/17/2023  . Influenza, High Dose Seasonal PF 06/07/2017, 04/26/2018, 04/26/2018  . Influenza,inj,Quad PF,6+ Mos 04/05/2015  . Influenza-Unspecified 04/05/2015, 04/13/2019  . PFIZER Comirnaty(Gray Top)Covid-19 Tri-Sucrose Vaccine 02/02/2021  . PFIZER(Purple Top)SARS-COV-2 Vaccination 07/25/2019, 08/15/2019, 05/14/2020  . Pfizer Covid-19 Vaccine Bivalent  Booster 42yrs & up 05/12/2021  . Pneumococcal Conjugate-13 11/17/2016  . Pneumococcal Polysaccharide-23 11/22/2017  . Td 05/17/2007  . Tdap 05/17/2007, 09/23/2017  . Zoster Recombinant(Shingrix) 05/18/2019, 10/03/2019  . Zoster, Live 08/21/2012    Health Maintenance  Topic Date Due  . COVID-19 Vaccine (6 - 2024-25 season) 03/06/2023  .  INFLUENZA VACCINE  02/03/2024  . Medicare Annual Wellness (AWV)  01/23/2025  . Colonoscopy  04/08/2026  . DTaP/Tdap/Td (4 - Td or Tdap) 09/24/2027  . Pneumococcal Vaccine: 50+ Years  Completed  . Hepatitis C Screening  Completed  . Zoster Vaccines- Shingrix  Completed  . HPV VACCINES  Aged Out  . Meningococcal B Vaccine  Aged Out  . Pneumococcal Vaccine  Discontinued    Discussed health benefits of physical activity, and encouraged him to engage in regular exercise appropriate for his age and condition.  Problem List Items Addressed This Visit   None                No follow-ups on file.     Jon Eva, MD  [facility] [phone] (phone) [phone] (fax)  [facility]

## 2024-02-21 ENCOUNTER — Ambulatory Visit: Payer: Self-pay | Admitting: Family Medicine

## 2024-02-21 LAB — PSA TOTAL (REFLEX TO FREE): Prostate Specific Ag, Serum: 0.8 ng/mL (ref 0.0–4.0)

## 2024-02-21 LAB — COMPREHENSIVE METABOLIC PANEL WITH GFR
ALT: 21 IU/L (ref 0–44)
AST: 19 IU/L (ref 0–40)
Albumin: 4.7 g/dL (ref 3.8–4.8)
Alkaline Phosphatase: 57 IU/L (ref 44–121)
BUN/Creatinine Ratio: 11 (ref 10–24)
BUN: 12 mg/dL (ref 8–27)
Bilirubin Total: 0.6 mg/dL (ref 0.0–1.2)
CO2: 25 mmol/L (ref 20–29)
Calcium: 10 mg/dL (ref 8.6–10.2)
Chloride: 104 mmol/L (ref 96–106)
Creatinine, Ser: 1.12 mg/dL (ref 0.76–1.27)
Globulin, Total: 2.6 g/dL (ref 1.5–4.5)
Glucose: 70 mg/dL (ref 70–99)
Potassium: 4.7 mmol/L (ref 3.5–5.2)
Sodium: 142 mmol/L (ref 134–144)
Total Protein: 7.3 g/dL (ref 6.0–8.5)
eGFR: 69 mL/min/1.73 (ref >59)

## 2024-02-21 LAB — HEMOGLOBIN A1C
Est. average glucose Bld gHb Est-mCnc: 114 mg/dL
Hgb A1c MFr Bld: 5.6 % (ref 4.8–5.6)

## 2024-02-21 LAB — LIPID PANEL
Chol/HDL Ratio: 2.5 ratio (ref 0.0–5.0)
Cholesterol, Total: 130 mg/dL (ref 100–199)
HDL: 52 mg/dL (ref >39)
LDL Chol Calc (NIH): 59 mg/dL (ref 0–99)
Triglycerides: 105 mg/dL (ref 0–149)
VLDL Cholesterol Cal: 19 mg/dL (ref 5–40)

## 2024-02-24 ENCOUNTER — Ambulatory Visit

## 2024-02-28 ENCOUNTER — Other Ambulatory Visit: Payer: Self-pay | Admitting: Family Medicine

## 2024-02-29 DIAGNOSIS — Z955 Presence of coronary angioplasty implant and graft: Secondary | ICD-10-CM

## 2024-02-29 NOTE — Progress Notes (Signed)
 Cardiac Individual Treatment Plan  Patient Details  Name: Ryan English MRN: [id number] Date of Birth: 1950-08-06 Referring Provider:   Flowsheet Row Cardiac Rehab from 10/19/2023 in [facility] Cardiac and Pulmonary Rehab  Referring Provider Neita Breeding, MD    Initial Encounter Date:  Flowsheet Row Cardiac Rehab from 10/19/2023 in [facility] Cardiac and Pulmonary Rehab  Date 10/19/23    Visit Diagnosis: Status post coronary artery stent placement  Patient's Home Medications on Admission:  Current Outpatient Medications:    acetaminophen  (TYLENOL ) 500 MG tablet, Take by mouth., Disp: , Rfl:    albuterol  (VENTOLIN  HFA) 108 (90 Base) MCG/ACT inhaler, TAKE 2 PUFFS BY MOUTH EVERY 6 HOURS AS NEEDED FOR WHEEZE OR SHORTNESS OF BREATH, Disp: 18 each, Rfl: 0   amLODipine  (NORVASC ) 2.5 MG tablet, Take 1 tablet (2.5 mg total) by mouth daily., Disp: 30 tablet, Rfl: 11   aspirin  EC 81 MG tablet, Take by mouth., Disp: , Rfl:    atorvastatin (LIPITOR) 40 MG tablet, Take 40 mg by mouth daily., Disp: , Rfl:    Dextromethorphan HBr (DELSYM PO), Take by mouth as needed., Disp: , Rfl:    ezetimibe  (ZETIA ) 10 MG tablet, Take 1 tablet (10 mg total) by mouth daily., Disp: 90 tablet, Rfl: 3   ipratropium (ATROVENT ) 0.03 % nasal spray, Place 2 sprays into both nostrils every 12 (twelve) hours., Disp: 30 mL, Rfl: 12   loratadine  (CLARITIN  REDITABS) 10 MG dissolvable tablet, Dissolve 1 tablet (10 mg total) in the mouth daily., Disp: , Rfl:    losartan  (COZAAR ) 50 MG tablet, TAKE 1 TABLET BY MOUTH ONCE DAILY, Disp: 90 tablet, Rfl: 1   Misc Natural Products (OSTEO BI-FLEX TRIPLE STRENGTH) TABS, Take 2 tablets by mouth daily., Disp: , Rfl:    montelukast  (SINGULAIR ) 10 MG tablet, Take by mouth., Disp: , Rfl:    nitroGLYCERIN  (NITROSTAT ) 0.4 MG SL tablet, Place under the tongue., Disp: , Rfl:    omeprazole  (PRILOSEC) 20 MG capsule, Take 1 capsule (20 mg total) by mouth daily., Disp: 30 capsule, Rfl: 3   prasugrel (EFFIENT)  10 MG TABS tablet, Take 1 tablet by mouth daily., Disp: , Rfl:    Pseudoephedrine-guaiFENesin (MUCINEX D PO), Take by mouth as needed., Disp: , Rfl:   Past Medical History: Past Medical History:  Diagnosis Date   Anginal pain (HCC)    Arthritis    GERD (gastroesophageal reflux disease)    Hyperlipidemia     Tobacco Use: Social History   Tobacco Use  Smoking Status Never  Smokeless Tobacco Never    Labs: Review Flowsheet  More data exists      Latest Ref Rng & Units 02/01/2020 02/02/2021 02/10/2022 02/17/2023 02/20/2024  Labs for ITP Cardiac and Pulmonary Rehab  Cholestrol 100 - 199 mg/dL 872  858  863  871  869   LDL (calc) 0 - 99 mg/dL 63  69  67  61  59   HDL-C >39 mg/dL 47  52  42  44  52   Trlycerides 0 - 149 mg/dL 90  886  845  867  894   Hemoglobin A1c 4.8 - 5.6 % - 5.7  5.5  5.5  5.6      Exercise Target Goals: Exercise Program Goal: Individual exercise prescription set using results from initial 6 min walk test and THRR while considering  patient's activity barriers and safety.   Exercise Prescription Goal: Initial exercise prescription builds to 30-45 minutes a day of aerobic activity, 2-3 days  per week.  Home exercise guidelines will be given to patient during program as part of exercise prescription that the participant will acknowledge.   Education: Aerobic Exercise: - Group verbal and visual presentation on the components of exercise prescription. Introduces F.I.T.T principle from ACSM for exercise prescriptions.  Reviews F.I.T.T. principles of aerobic exercise including progression. Written material provided at class time. Flowsheet Row Cardiac Rehab from 12/08/2023 in [facility] Cardiac and Pulmonary Rehab  Date 12/01/23  Educator MB  Instruction Review Code 1- Verbalizes Understanding    Education: Resistance Exercise: - Group verbal and visual presentation on the components of exercise prescription. Introduces F.I.T.T principle from ACSM for exercise prescriptions   Reviews F.I.T.T. principles of resistance exercise including progression. Written material provided at class time.    Education: Exercise & Equipment Safety: - Individual verbal instruction and demonstration of equipment use and safety with use of the equipment. Flowsheet Row Cardiac Rehab from 12/08/2023 in [facility] Cardiac and Pulmonary Rehab  Date 10/12/23  Educator jh  Instruction Review Code 1- Verbalizes Understanding    Education: Exercise Physiology & General Exercise Guidelines: - Group verbal and written instruction with models to review the exercise physiology of the cardiovascular system and associated critical values. Provides general exercise guidelines with specific guidelines to those with heart or lung disease. Written material provided at class time. Flowsheet Row Cardiac Rehab from 12/08/2023 in [facility] Cardiac and Pulmonary Rehab  Date 11/17/23  Educator MB  Instruction Review Code 1- [employer] Understanding    Education: Flexibility, Balance, Mind/Body Relaxation: - Group verbal and visual presentation with interactive activity on the components of exercise prescription. Introduces F.I.T.T principle from ACSM for exercise prescriptions. Reviews F.I.T.T. principles of flexibility and balance exercise training including progression. Also discusses the mind body connection.  Reviews various relaxation techniques to help reduce and manage stress (i.e. Deep breathing, progressive muscle relaxation, and visualization). Balance handout provided to take home. Written material provided at class time.   Activity Barriers & Risk Stratification:  Activity Barriers & Cardiac Risk Stratification - 10/19/23 1135       Activity Barriers & Cardiac Risk Stratification   Activity Barriers Other (comment);Neck/Spine Problems;Arthritis    Comments Right and Left knees need to be replaced.    Cardiac Risk Stratification High          6 Minute Walk:  6 Minute Walk     Row Name 10/19/23  1131         6 Minute Walk   Phase Initial     Distance 1135 feet     Walk Time 6 minutes     # of Rest Breaks 0     MPH 2.15     METS 2.42     RPE 11     Perceived Dyspnea  0     VO2 Peak 8.47     Symptoms Yes (comment)     Comments knee soreness     Resting HR 66 bpm     Resting BP 136/70     Resting Oxygen Saturation  97 %     Exercise Oxygen Saturation  during 6 min walk 97 %     Max Ex. HR 95 bpm     Max Ex. BP 144/72     2 Minute Post BP 128/70        Oxygen Initial Assessment:   Oxygen Re-Evaluation:   Oxygen Discharge (Final Oxygen Re-Evaluation):   Initial Exercise Prescription:  Initial Exercise Prescription - 10/19/23 1100  Date of Initial Exercise RX and Referring Provider   Date 10/19/23    Referring Provider Neita Breeding, MD      Oxygen   Maintain Oxygen Saturation 88% or higher      Treadmill   MPH 2.2    Grade 0    Minutes 15    METs 2.68      NuStep   Level 3    SPM 80    Minutes 15    METs 2.42      REL-XR   Level 2    Speed 50    Minutes 15    METs 2.42      Prescription Details   Frequency (times per week) 2    Duration Progress to 30 minutes of continuous aerobic without signs/symptoms of physical distress      Intensity   THRR 40-80% of Max Heartrate 98-130    Ratings of Perceived Exertion 11-13    Perceived Dyspnea 0-4      Progression   Progression Continue to progress workloads to maintain intensity without signs/symptoms of physical distress.      Resistance Training   Training Prescription Yes    Weight 10 lb    Reps 10-15          Perform Capillary Blood Glucose checks as needed.  Exercise Prescription Changes:   Exercise Prescription Changes     Row Name 10/19/23 1100 11/10/23 1400 11/24/23 1700 12/08/23 1600 12/21/23 1400     Response to Exercise   Blood Pressure (Admit) 136/70 98/62 98/60  122/66 128/76   Blood Pressure (Exercise) 144/72 132/60 134/60 144/64 --   Blood Pressure (Exit)  128/70 100/60 102/80 120/64 122/70   Heart Rate (Admit) 66 bpm 81 bpm 75 bpm 99 bpm 91 bpm   Heart Rate (Exercise) 95 bpm 110 bpm 113 bpm 109 bpm 104 bpm   Heart Rate (Exit) 58 bpm 81 bpm 69 bpm 74 bpm 74 bpm   Oxygen Saturation (Admit) 97 % -- -- -- --   Oxygen Saturation (Exercise) 97 % -- -- -- --   Rating of Perceived Exertion (Exercise) 11 13 15 15 14    Perceived Dyspnea (Exercise) 0 -- -- -- --   Symptoms knee soreness none none none none   Comments Results First two weeks of exercise -- -- --   Duration -- Continue with 30 min of aerobic exercise without signs/symptoms of physical distress. Continue with 30 min of aerobic exercise without signs/symptoms of physical distress. Continue with 30 min of aerobic exercise without signs/symptoms of physical distress. Continue with 30 min of aerobic exercise without signs/symptoms of physical distress.   Intensity -- THRR unchanged THRR unchanged THRR unchanged THRR unchanged     Progression   Progression -- Continue to progress workloads to maintain intensity without signs/symptoms of physical distress. Continue to progress workloads to maintain intensity without signs/symptoms of physical distress. Continue to progress workloads to maintain intensity without signs/symptoms of physical distress. Continue to progress workloads to maintain intensity without signs/symptoms of physical distress.   Average METs -- 3.22 4.25 3.55 3.54     Resistance Training   Training Prescription -- Yes Yes Yes Yes   Weight -- 10 lb 10 lb 10 lb 10 lb   Reps -- 10-15 10-15 10-15 10-15     Interval Training   Interval Training -- No No No No     Treadmill   MPH -- 2.5 2.5 2.5 2.5   Grade -- 1 1.5  1 0   Minutes -- 15 15 15 15    METs -- 3.26 3.43 3.26 2.91     NuStep   Level -- 6 -- 7  T6 --   Minutes -- 15 -- 15 --   METs -- 5.5 -- 3.4 --     REL-XR   Level -- 2 4 10 8    Minutes -- 15 15 15 15    METs -- -- 9 6.1 3.8     T5 Nustep   Level -- 5 6  -- --   Minutes -- 15 15 -- --   METs -- 3 3.9 -- --     Oxygen   Maintain Oxygen Saturation -- 88% or higher 88% or higher 88% or higher 88% or higher    Row Name 01/03/24 1500 01/17/24 1500 01/31/24 1300 02/16/24 0800       Response to Exercise   Blood Pressure (Admit) 118/64 118/64 104/62 126/68    Blood Pressure (Exit) 124/68 102/62 122/66 110/60    Heart Rate (Admit) 69 bpm 73 bpm 55 bpm 95 bpm    Heart Rate (Exercise) 127 bpm 114 bpm 115 bpm 115 bpm    Heart Rate (Exit) 88 bpm 93 bpm 84 bpm 81 bpm    Rating of Perceived Exertion (Exercise) 16 14 14 14     Symptoms none none none none    Duration Continue with 30 min of aerobic exercise without signs/symptoms of physical distress. Continue with 30 min of aerobic exercise without signs/symptoms of physical distress. Continue with 30 min of aerobic exercise without signs/symptoms of physical distress. Continue with 30 min of aerobic exercise without signs/symptoms of physical distress.    Intensity THRR unchanged THRR unchanged THRR unchanged THRR unchanged      Progression   Progression Continue to progress workloads to maintain intensity without signs/symptoms of physical distress. Continue to progress workloads to maintain intensity without signs/symptoms of physical distress. Continue to progress workloads to maintain intensity without signs/symptoms of physical distress. Continue to progress workloads to maintain intensity without signs/symptoms of physical distress.    Average METs 4.09 4.87 4.46 4.33      Resistance Training   Training Prescription Yes Yes Yes Yes    Weight 10 lb 10 lb 10 lb 10 lb    Reps 10-15 10-15 10-15 10-15      Interval Training   Interval Training No No No No      Treadmill   MPH 2.5 2.8 2.5 2.5    Grade 3.5 0 4.5 6.5    Minutes 15 15 15 15     METs 4.12 3.14 4.47 5.15      NuStep   Level -- 6 -- 5    Minutes -- 15 -- 15    METs -- 6.6 -- 5.1      REL-XR   Level -- -- 7 6    Minutes -- --  15 15    METs -- -- 5.3 4.9      Biostep-RELP   Level 8 -- -- --    Minutes 15 -- -- --    METs 5 -- -- --      Rower   Level 5 -- -- --    Watts 55 -- -- --    Minutes 15 -- -- --      Oxygen   Maintain Oxygen Saturation 88% or higher 88% or higher 88% or higher 88% or higher       Exercise Comments:  Exercise Comments     Row Name 10/24/23 0805           Exercise Comments First full day of exercise!  Patient was oriented to gym and equipment including functions, settings, policies, and procedures.  Patient's individual exercise prescription and treatment plan were reviewed.  All starting workloads were established based on the results of the 6 minute walk test done at initial orientation visit.  The plan for exercise progression was also introduced and progression will be customized based on patient's performance and goals.          Exercise Goals and Review:   Exercise Goals     Row Name 10/19/23 1135             Exercise Goals   Increase Physical Activity Yes       Intervention Provide advice, education, support and counseling about physical activity/exercise needs.;Develop an individualized exercise prescription for aerobic and resistive training based on initial evaluation findings, risk stratification, comorbidities and participant's personal goals.       Expected Outcomes Short Term: Attend rehab on a regular basis to increase amount of physical activity.;Long Term: Add in home exercise to make exercise part of routine and to increase amount of physical activity.;Long Term: Exercising regularly at least 3-5 days a week.       Increase Strength and Stamina Yes       Intervention Provide advice, education, support and counseling about physical activity/exercise needs.;Develop an individualized exercise prescription for aerobic and resistive training based on initial evaluation findings, risk stratification, comorbidities and participant's personal goals.        Expected Outcomes Short Term: Increase workloads from initial exercise prescription for resistance, speed, and METs.;Short Term: Perform resistance training exercises routinely during rehab and add in resistance training at home;Long Term: Improve cardiorespiratory fitness, muscular endurance and strength as measured by increased METs and functional capacity ( )       Able to understand and use rate of perceived exertion (RPE) scale Yes       Intervention Provide education and explanation on how to use RPE scale       Expected Outcomes Short Term: Able to use RPE daily in rehab to express subjective intensity level;Long Term:  Able to use RPE to guide intensity level when exercising independently       Able to understand and use Dyspnea scale Yes       Intervention Provide education and explanation on how to use Dyspnea scale       Expected Outcomes Short Term: Able to use Dyspnea scale daily in rehab to express subjective sense of shortness of breath during exertion;Long Term: Able to use Dyspnea scale to guide intensity level when exercising independently       Knowledge and understanding of Target Heart Rate Range (THRR) Yes       Intervention Provide education and explanation of THRR including how the numbers were predicted and where they are located for reference       Expected Outcomes Long Term: Able to use THRR to govern intensity when exercising independently;Short Term: Able to state/look up THRR;Short Term: Able to use daily as guideline for intensity in rehab       Able to check pulse independently Yes       Intervention Provide education and demonstration on how to check pulse in carotid and radial arteries.;Review the importance of being able to check your own pulse for safety during independent exercise  Expected Outcomes Short Term: Able to explain why pulse checking is important during independent exercise;Long Term: Able to check pulse independently and accurately        Understanding of Exercise Prescription Yes       Intervention Provide education, explanation, and written materials on patient's individual exercise prescription       Expected Outcomes Short Term: Able to explain program exercise prescription;Long Term: Able to explain home exercise prescription to exercise independently          Exercise Goals Re-Evaluation :  Exercise Goals Re-Evaluation     Row Name 10/24/23 0805 11/10/23 1434 11/24/23 1759 12/08/23 1627 12/12/23 0751     Exercise Goal Re-Evaluation   Exercise Goals Review Able to understand and use rate of perceived exertion (RPE) scale;Knowledge and understanding of Target Heart Rate Range (THRR);Understanding of Exercise Prescription;Able to understand and use Dyspnea scale Increase Strength and Stamina;Increase Physical Activity;Understanding of Exercise Prescription Increase Strength and Stamina;Increase Physical Activity;Understanding of Exercise Prescription Increase Strength and Stamina;Increase Physical Activity;Understanding of Exercise Prescription Increase Physical Activity;Increase Strength and Stamina;Understanding of Exercise Prescription   Comments Reviewed RPE and dyspnea scale, THR and program prescription with pt today.  Pt voiced understanding and was given a copy of goals to take home. Jerilynn is off to a good start in the program. He did well on the treadmill at a speed of 2.5 mph with an incline of 1%. He also improved to level 6 on the T4 nustep and level 5 on the T5 nustep. We will continue to monitor his progress in the program. Jerilynn is doing well in rehab. He increased his workload on the treadmill to a speed of 2.5 mph and incline of 1.5%. He also increased to level 4 on the XR and level 6 on the T5 nustep. We will continue to monitor his progress in the program. Jerilynn continues to do well in rehab. He continues to work on the treadmill at a speed of 2.5 mph with an incline of 1%. He also improved to level 10 on  the XR and level 7 on the T6 nustep. We will continue to monitor his progress in the program. Lawerence is doing well in rehab. He is walking some at home, most of his activity is yard work.   Expected Outcomes Short: Use RPE daily to regulate intensity. Long: Follow program prescription in THR. Short: Continue to follow current exercise prescription. Long: Continue exercise to improve strength and stamina. Short: Continue to progressively increase treadmill and XR workloads. Long: Continue exercise to improve strength and stamina. Short: Continue to progressively increase workloads. Long: Continue exercise to improve strength and stamina. STG: Continue to attended rehab and increase workloads as able.    Row Name 12/21/23 1437 01/03/24 1514 01/17/24 1522 01/31/24 1339 02/16/24 0807     Exercise Goal Re-Evaluation   Exercise Goals Review Increase Physical Activity;Increase Strength and Stamina;Understanding of Exercise Prescription Increase Physical Activity;Increase Strength and Stamina;Understanding of Exercise Prescription Increase Physical Activity;Increase Strength and Stamina;Understanding of Exercise Prescription Increase Physical Activity;Increase Strength and Stamina;Understanding of Exercise Prescription Increase Physical Activity;Increase Strength and Stamina;Understanding of Exercise Prescription   Comments Jerilynn continues to do well in rehab. He was able to maintain his workload on the treadmill at a speed of 2.42mph. He was also able to use the XR at level 8. We will continue to monitor his progress in the program. Jerilynn has only attended two sessions since the last review. He was able to increase his treadmill workload  to a speed of 2.5 mph with an incline of 3.5%. He also began using the biostep at level 8 and the rower at level 5. We will continue to monitor his progress in the program. Jerilynn is doing well in rehab. He was only able to attend one session during this review period.  During his session he was able to increase his speed on the treadmill from 2.5 to 2.71mph. We will continue to monitor his progress in the program. Jerilynn is doing well in rehab but has only attended two sessions. He did increase his workload on the treadmill to a speed of 2.5 mph with an incline of 4.5%. He also continues to work at level 7 on the XR. We will continue to monitor his progress in the program. Jerilynn is doing well in rehab. He is due for his post soon and hopes to improve. He increased his workload on the treadmill to a speed of 2.5 mph with 6.5% incline. He worked at level 6 on the XR and level 5 on the T4 nustep.. We will continue to monitor his progress in the program.   Expected Outcomes Short: Try to increase treadmill incline to 2%. Long: Continue exercise to improve strength and stamina. Short: Attend rehab more consistently. Long: Continue exercise to improve strength and stamina. Short: Continue to increase treadmill workload, attend rehab more regularly. Long: Continue exercise to improve strength and stamina. Short: Attend rehab more regularly. Long: Continue exercise to improve strength and stamina. Short: Improve on post . Long: Continue to increase overall METs and stamina.      Discharge Exercise Prescription (Final Exercise Prescription Changes):  Exercise Prescription Changes - 02/16/24 0800       Response to Exercise   Blood Pressure (Admit) 126/68    Blood Pressure (Exit) 110/60    Heart Rate (Admit) 95 bpm    Heart Rate (Exercise) 115 bpm    Heart Rate (Exit) 81 bpm    Rating of Perceived Exertion (Exercise) 14    Symptoms none    Duration Continue with 30 min of aerobic exercise without signs/symptoms of physical distress.    Intensity THRR unchanged      Progression   Progression Continue to progress workloads to maintain intensity without signs/symptoms of physical distress.    Average METs 4.33      Resistance Training   Training  Prescription Yes    Weight 10 lb    Reps 10-15      Interval Training   Interval Training No      Treadmill   MPH 2.5    Grade 6.5    Minutes 15    METs 5.15      NuStep   Level 5    Minutes 15    METs 5.1      REL-XR   Level 6    Minutes 15    METs 4.9      Oxygen   Maintain Oxygen Saturation 88% or higher          Nutrition:  Target Goals: Understanding of nutrition guidelines, daily intake of sodium 1500mg , cholesterol 200mg , calories 30% from fat and 7% or less from saturated fats, daily to have 5 or more servings of fruits and vegetables.  Education: Nutrition 1 -Group instruction provided by verbal, written material, interactive activities, discussions, models, and posters to present general guidelines for heart healthy nutrition including macronutrients, label reading, and promoting whole foods over processed counterparts. Education serves as Museum/gallery conservator  discussion of heart healthy eating for all. Written material provided at class time.    Education: Nutrition 2 -Group instruction provided by verbal, written material, interactive activities, discussions, models, and posters to present general guidelines for heart healthy nutrition including sodium, cholesterol, and saturated fat. Providing guidance of habit forming to improve blood pressure, cholesterol, and body weight. Written material provided at class time.     Biometrics:  Pre Biometrics - 10/19/23 1136       Pre Biometrics   Height 5' 10.5 (1.791 m)    Weight 208 lb 4.8 oz (94.5 kg)    Waist Circumference 39 inches    Hip Circumference 40 inches    Waist to Hip Ratio 0.98 %    BMI (Calculated) 29.46    Single Leg Stand 6.55 seconds           Nutrition Therapy Plan and Nutrition Goals:  Nutrition Therapy & Goals - 10/19/23 1130       Nutrition Therapy   RD appointment deferred Yes      Intervention Plan   Intervention Prescribe, educate and counsel regarding individualized  specific dietary modifications aiming towards targeted core components such as weight, hypertension, lipid management, diabetes, heart failure and other comorbidities.    Expected Outcomes Short Term Goal: Understand basic principles of dietary content, such as calories, fat, sodium, cholesterol and nutrients.;Short Term Goal: A plan has been developed with personal nutrition goals set during dietitian appointment.;Long Term Goal: Adherence to prescribed nutrition plan.          Nutrition Assessments:  MEDIFICTS Score Key: >=70 Need to make dietary changes  40-70 Heart Healthy Diet <= 40 Therapeutic Level Cholesterol Diet   Picture Your Plate Scores: <59 Unhealthy dietary pattern with much room for improvement. 41-50 Dietary pattern unlikely to meet recommendations for good health and room for improvement. 51-60 More healthful dietary pattern, with some room for improvement.  >60 Healthy dietary pattern, although there may be some specific behaviors that could be improved.    Nutrition Goals Re-Evaluation:  Nutrition Goals Re-Evaluation     Row Name 12/12/23 0753             Goals   Comment Deferred RD appointment          Nutrition Goals Discharge (Final Nutrition Goals Re-Evaluation):  Nutrition Goals Re-Evaluation - 12/12/23 0753       Goals   Comment Deferred RD appointment          Psychosocial: Target Goals: Acknowledge presence or absence of significant depression and/or stress, maximize coping skills, provide positive support system. Participant is able to verbalize types and ability to use techniques and skills needed for reducing stress and depression.   Education: Stress, Anxiety, and Depression - Group verbal and visual presentation to define topics covered.  Reviews how body is impacted by stress, anxiety, and depression.  Also discusses healthy ways to reduce stress and to treat/manage anxiety and depression. Written material provided at class  time. Flowsheet Row Cardiac Rehab from 12/08/2023 in [facility] Cardiac and Pulmonary Rehab  Date 11/10/23  Educator SB  Instruction Review Code 1- [employer] Understanding    Education: Sleep Hygiene -Provides group verbal and written instruction about how sleep can affect your health.  Define sleep hygiene, discuss sleep cycles and impact of sleep habits. Review good sleep hygiene tips.   Initial Review & Psychosocial Screening:  Initial Psych Review & Screening - 10/12/23 1426       Initial Review  Current issues with None Identified      Family Dynamics   Good Support System? Yes    Comments He can look to his wife and two sons for support. He states no issues with his mental state and does not take any medication for his mood.      Barriers   Psychosocial barriers to participate in program The patient should benefit from training in stress management and relaxation.;There are no identifiable barriers or psychosocial needs.      Screening Interventions   Interventions Provide feedback about the scores to participant;Program counselor consult;To provide support and resources with identified psychosocial needs    Expected Outcomes Short Term goal: Utilizing psychosocial counselor, staff and physician to assist with identification of specific Stressors or current issues interfering with healing process. Setting desired goal for each stressor or current issue identified.;Long Term Goal: Stressors or current issues are controlled or eliminated.;Short Term goal: Identification and review with participant of any Quality of Life or Depression concerns found by scoring the questionnaire.;Long Term goal: The participant improves quality of Life and PHQ9 Scores as seen by post scores and/or verbalization of changes          Quality of Life Scores:   Scores of 19 and below usually indicate a poorer quality of life in these areas.  A difference of  2-3 points is a clinically meaningful difference.   A difference of 2-3 points in the total score of the Quality of Life Index has been associated with significant improvement in overall quality of life, self-image, physical symptoms, and general health in studies assessing change in quality of life.  PHQ-9: Review Flowsheet  More data exists      02/20/2024 01/24/2024 10/19/2023 08/18/2023 02/17/2023  Depression screen PHQ 2/9  Decreased Interest 0 0 0 0 0  Down, Depressed, Hopeless 0 0 0 0 0  PHQ - 2 Score 0 0 0 0 0  Altered sleeping 0 0 0 - -  Tired, decreased energy 0 0 0 - -  Change in appetite 0 0 0 - -  Feeling bad or failure about yourself  0 0 0 - -  Trouble concentrating 0 0 0 - -  Moving slowly or fidgety/restless 0 0 0 - -  Suicidal thoughts 0 0 0 - -  PHQ-9 Score 0 0 0 - -  Difficult doing work/chores Not difficult at all Not difficult at all - - -   Interpretation of Total Score  Total Score Depression Severity:  1-4 = Minimal depression, 5-9 = Mild depression, 10-14 = Moderate depression, 15-19 = Moderately severe depression, 20-27 = Severe depression   Psychosocial Evaluation and Intervention:  Psychosocial Evaluation - 10/12/23 1427       Psychosocial Evaluation & Interventions   Interventions Encouraged to exercise with the program and follow exercise prescription;Relaxation education;Stress management education    Comments He can look to his wife and two sons for support. He states no issues with his mental state and does not take any medication for his mood.    Expected Outcomes Short: Start [facility] to help with mood. Long: Maintain a healthy mental state    Continue Psychosocial Services  Follow up required by staff          Psychosocial Re-Evaluation:  Psychosocial Re-Evaluation     Row Name 12/12/23 [phone]             Psychosocial Re-Evaluation   Current issues with None Identified  Comments Jerilynn reports he is sleeping well, 6-8hr each night. Denies any stress, anxiety, or depression.        Expected Outcomes STG: Continue to attended rehab. LTG: Achieve and maintain a positive outlook on health and daily life       Interventions Encouraged to attend Cardiac Rehabilitation for the exercise       Continue Psychosocial Services  Follow up required by staff          Psychosocial Discharge (Final Psychosocial Re-Evaluation):  Psychosocial Re-Evaluation - 12/12/23 0752       Psychosocial Re-Evaluation   Current issues with None Identified    Comments Jerilynn reports he is sleeping well, 6-8hr each night. Denies any stress, anxiety, or depression.    Expected Outcomes STG: Continue to attended rehab. LTG: Achieve and maintain a positive outlook on health and daily life    Interventions Encouraged to attend Cardiac Rehabilitation for the exercise    Continue Psychosocial Services  Follow up required by staff          Vocational Rehabilitation: Provide vocational rehab assistance to qualifying candidates.   Vocational Rehab Evaluation & Intervention:   Education: Education Goals: Education classes will be provided on a variety of topics geared toward better understanding of heart health and risk factor modification. Participant will state understanding/return demonstration of topics presented as noted by education test scores.  Learning Barriers/Preferences:  Learning Barriers/Preferences - 10/12/23 1426       Learning Barriers/Preferences   Learning Barriers None    Learning Preferences None          General Cardiac Education Topics:  AED/CPR: - Group verbal and written instruction with the use of models to demonstrate the basic use of the AED with the basic ABC's of resuscitation.   Test and Procedures: - Group verbal and visual presentation and models provide information about basic cardiac anatomy and function. Reviews the testing methods done to diagnose heart disease and the outcomes of the test results. Describes the treatment choices: Medical  Management, Angioplasty, or Coronary Bypass Surgery for treating various heart conditions including Myocardial Infarction, Angina, Valve Disease, and Cardiac Arrhythmias. Written material provided at class time.   Medication Safety: - Group verbal and visual instruction to review commonly prescribed medications for heart and lung disease. Reviews the medication, class of the drug, and side effects. Includes the steps to properly store meds and maintain the prescription regimen. Written material provided at class time.   Intimacy: - Group verbal instruction through game format to discuss how heart and lung disease can affect sexual intimacy. Written material provided at class time. Flowsheet Row Cardiac Rehab from 12/08/2023 in [facility] Cardiac and Pulmonary Rehab  Date 12/01/23  Educator MB  Instruction Review Code 1- Verbalizes Understanding    Know Your Numbers and Heart Failure: - Group verbal and visual instruction to discuss disease risk factors for cardiac and pulmonary disease and treatment options.  Reviews associated critical values for Overweight/Obesity, Hypertension, Cholesterol, and Diabetes.  Discusses basics of heart failure: signs/symptoms and treatments.  Introduces Heart Failure Zone chart for action plan for heart failure. Written material provided at class time.   Infection Prevention: - Provides verbal and written material to individual with discussion of infection control including proper hand washing and proper equipment cleaning during exercise session. Flowsheet Row Cardiac Rehab from 12/08/2023 in [facility] Cardiac and Pulmonary Rehab  Date 10/12/23  Educator jh  Instruction Review Code 1- Verbalizes Understanding    Falls Prevention: - Provides  verbal and written material to individual with discussion of falls prevention and safety. Flowsheet Row Cardiac Rehab from 12/08/2023 in [facility] Cardiac and Pulmonary Rehab  Date 10/12/23  Educator jh  Instruction Review Code 1-  Verbalizes Understanding    Other: -Provides group and verbal instruction on various topics (see comments)   Knowledge Questionnaire Score:   Core Components/Risk Factors/Patient Goals at Admission:  Personal Goals and Risk Factors at Admission - 10/12/23 1425       Core Components/Risk Factors/Patient Goals on Admission    Weight Management Yes;Weight Maintenance;Weight Loss    Intervention Weight Management: Develop a combined nutrition and exercise program designed to reach desired caloric intake, while maintaining appropriate intake of nutrient and fiber, sodium and fats, and appropriate energy expenditure required for the weight goal.;Weight Management: Provide education and appropriate resources to help participant work on and attain dietary goals.;Weight Management/Obesity: Establish reasonable short term and long term weight goals.    Expected Outcomes Short Term: Continue to assess and modify interventions until short term weight is achieved;Weight Maintenance: Understanding of the daily nutrition guidelines, which includes 25-35% calories from fat, 7% or less cal from saturated fats, less than 200mg  cholesterol, less than 1.5gm of sodium, & 5 or more servings of fruits and vegetables daily;Understanding recommendations for meals to include 15-35% energy as protein, 25-35% energy from fat, 35-60% energy from carbohydrates, less than 200mg  of dietary cholesterol, 20-35 gm of total fiber daily;Weight Loss: Understanding of general recommendations for a balanced deficit meal plan, which promotes 1-2 lb weight loss per week and includes a negative energy balance of [phone] kcal/d;Understanding of distribution of calorie intake throughout the day with the consumption of 4-5 meals/snacks    Heart Failure Yes    Intervention Provide a combined exercise and nutrition program that is supplemented with education, support and counseling about heart failure. Directed toward relieving symptoms such  as shortness of breath, decreased exercise tolerance, and extremity edema.    Expected Outcomes Improve functional capacity of life;Short term: Attendance in program 2-3 days a week with increased exercise capacity. Reported lower sodium intake. Reported increased fruit and vegetable intake. Reports medication compliance.;Short term: Daily weights obtained and reported for increase. Utilizing diuretic protocols set by physician.;Long term: Adoption of self-care skills and reduction of barriers for early signs and symptoms recognition and intervention leading to self-care maintenance.    Hypertension Yes    Intervention Provide education on lifestyle modifcations including regular physical activity/exercise, weight management, moderate sodium restriction and increased consumption of fresh fruit, vegetables, and low fat dairy, alcohol moderation, and smoking cessation.;Monitor prescription use compliance.    Expected Outcomes Short Term: Continued assessment and intervention until BP is < 140/61mm HG in hypertensive participants. < 130/62mm HG in hypertensive participants with diabetes, heart failure or chronic kidney disease.;Long Term: Maintenance of blood pressure at goal levels.    Lipids Yes    Intervention Provide education and support for participant on nutrition & aerobic/resistive exercise along with prescribed medications to achieve LDL 70mg , HDL >40mg .    Expected Outcomes Short Term: Participant states understanding of desired cholesterol values and is compliant with medications prescribed. Participant is following exercise prescription and nutrition guidelines.;Long Term: Cholesterol controlled with medications as prescribed, with individualized exercise RX and with personalized nutrition plan. Value goals: LDL < 70mg , HDL > 40 mg.          Education:Diabetes - Individual verbal and written instruction to review signs/symptoms of diabetes, desired ranges of glucose level fasting, after  meals and with exercise. Acknowledge that pre and post exercise glucose checks will be done for 3 sessions at entry of program.   Core Components/Risk Factors/Patient Goals Review:   Goals and Risk Factor Review     Row Name 12/12/23 0753             Core Components/Risk Factors/Patient Goals Review   Personal Goals Review Hypertension       Review Jerilynn has a BP cuff at home and checks his BP daily, reports it is consistent and matches readings taken here at rehab.       Expected Outcomes STG: Continue to check BP at home. LTG: Manage risk factors independently          Core Components/Risk Factors/Patient Goals at Discharge (Final Review):   Goals and Risk Factor Review - 12/12/23 0753       Core Components/Risk Factors/Patient Goals Review   Personal Goals Review Hypertension    Review Jerilynn has a BP cuff at home and checks his BP daily, reports it is consistent and matches readings taken here at rehab.    Expected Outcomes STG: Continue to check BP at home. LTG: Manage risk factors independently          ITP Comments:  ITP Comments     Row Name 10/12/23 1425 10/19/23 1129 10/24/23 0804 11/09/23 1311 12/07/23 1107   ITP Comments Virtual Visit completed. Patient informed on EP and RD appointment and 6 Minute walk test. Patient also informed of patient health questionnaires on My Chart. Patient Verbalizes understanding. Visit diagnosis can be found in [facility] 09/23/2023. Patient is to bring in medication list. Completed and gym orientation for cardiac rehab. Initial ITP created and sent for review to Dr. Oneil Pinal, Medical Director. First full day of exercise!  Patient was oriented to gym and equipment including functions, settings, policies, and procedures.  Patient's individual exercise prescription and treatment plan were reviewed.  All starting workloads were established based on the results of the 6 minute walk test done at initial orientation visit.  The plan for  exercise progression was also introduced and progression will be customized based on patient's performance and goals. 30 Day review completed. Medical Director ITP review done, changes made as directed, and signed approval by Medical Director. New to program. 30 Day review completed. Medical Director ITP review done, changes made as directed, and signed approval by Medical Director.    Row Name 01/04/24 1115 02/01/24 1013 02/29/24 1049       ITP Comments 30 Day review completed. Medical Director ITP review done, changes made as directed, and signed approval by Medical Director. 30 Day review completed. Medical Director ITP review done, changes made as directed, and signed approval by Medical Director. 30 Day review completed. Medical Director ITP review done, changes made as directed, and signed approval by Medical Director.        Comments: 30 day review

## 2024-03-07 DIAGNOSIS — K080 Exfoliation of teeth due to systemic causes: Secondary | ICD-10-CM | POA: Diagnosis not present

## 2024-03-13 ENCOUNTER — Encounter: Attending: Cardiology

## 2024-03-13 VITALS — Ht 70.5 in | Wt 208.3 lb

## 2024-03-13 DIAGNOSIS — Z955 Presence of coronary angioplasty implant and graft: Secondary | ICD-10-CM | POA: Diagnosis not present

## 2024-03-13 NOTE — Patient Instructions (Signed)
 Discharge Patient Instructions  Patient Details  Name: Ryan English MRN: [id number] Date of Birth: 26-Nov-1950 Referring Provider:  [name], MD   Number of Visits: 33  Reason for Discharge:  Patient reached a stable level of exercise. Patient independent in their exercise. Patient has met program and personal goals.   Diagnosis:  Status post coronary artery stent placement   Functional Capacity:  6 Minute Walk     Row Name 10/19/23 1131 03/13/24 0935       6 Minute Walk   Phase Initial Discharge    Distance 1135 feet 1320 feet    Distance % Change -- 16 %    Distance Feet Change -- 185 ft    Walk Time 6 minutes 6 minutes    # of Rest Breaks 0 0    MPH 2.15 2.5    METS 2.42 2.69    RPE 11 10    Perceived Dyspnea  0 1    VO2 Peak 8.47 9.4    Symptoms Yes (comment) Yes (comment)    Comments knee soreness knee pain- constant    Resting HR 66 bpm 70 bpm    Resting BP 136/70 122/76    Resting Oxygen Saturation  97 % 96 %    Exercise Oxygen Saturation  during 6 min walk 97 % 92 %    Max Ex. HR 95 bpm 103 bpm    Max Ex. BP 144/72 126/64    2 Minute Post BP 128/70 --      Nutrition & Weight - Outcomes:  Pre Biometrics - 10/19/23 1136       Pre Biometrics   Height 5' 10.5 (1.791 m)    Weight 208 lb 4.8 oz (94.5 kg)    Waist Circumference 39 inches    Hip Circumference 40 inches    Waist to Hip Ratio 0.98 %    BMI (Calculated) 29.46    Single Leg Stand 6.55 seconds          Post Biometrics - 03/13/24 0938        Post  Biometrics   Height 5' 10.5 (1.791 m)    Weight 208 lb 4.8 oz (94.5 kg)    Waist Circumference 40.5 inches    Hip Circumference 39 inches    Waist to Hip Ratio 1.04 %    BMI (Calculated) 29.46    Single Leg Stand 14.75 seconds

## 2024-03-13 NOTE — Progress Notes (Signed)
 Daily Session Note  Patient Details  Name: Ryan English MRN: [id number] Date of Birth: June 03, 1951 Referring Provider:   Flowsheet Row Cardiac Rehab from 10/19/2023 in [facility] Cardiac and Pulmonary Rehab  Referring Provider Neita Breeding, MD    Encounter Date: 03/13/2024  Check In:  Session Check In - 03/13/24 9076       Check-In   Supervising physician immediately available to respond to emergencies See telemetry face sheet for immediately available ER MD    Location [facility]-Cardiac & Pulmonary Rehab    Staff Present Burnard Davenport RN,BSN,MPA;Maxon Conetta BS, Exercise Physiologist;Margaret Best, MS, Exercise Physiologist;Jason Elnor RDN,LDN    Virtual Visit No    Medication changes reported     No    Fall or balance concerns reported    No    Warm-up and Cool-down Performed on first and last piece of equipment    Resistance Training Performed Yes    VAD Patient? No    PAD/SET Patient? No      Pain Assessment   Currently in Pain? No/denies             Social History   Tobacco Use  Smoking Status Never  Smokeless Tobacco Never    Goals Met:  Independence with exercise equipment Exercise tolerated well No report of concerns or symptoms today Strength training completed today  Goals Unmet:  Not Applicable  Comments: Pt able to follow exercise prescription today without complaint.  Will continue to monitor for progression.    Dr. Oneil Pinal is Medical Director for [facility] Cardiac Rehabilitation.  Dr. Fuad Aleskerov is Medical Director for [facility] Pulmonary Rehabilitation.

## 2024-03-15 ENCOUNTER — Ambulatory Visit

## 2024-03-15 DIAGNOSIS — Z955 Presence of coronary angioplasty implant and graft: Secondary | ICD-10-CM

## 2024-03-15 NOTE — Progress Notes (Signed)
 Daily Session Note  Patient Details  Name: GAYLEN VENNING MRN: [id number] Date of Birth: [birth year] Referring Provider:   Flowsheet Row Cardiac Rehab from 10/19/2023 in [facility] Cardiac and Pulmonary Rehab  Referring Provider Neita Breeding, MD    Encounter Date: 03/15/2024  Check In:  Session Check In - 03/15/24 1405       Check-In   Supervising physician immediately available to respond to emergencies See telemetry face sheet for immediately available ER MD    Location [facility]-Cardiac & Pulmonary Rehab    Staff Present Fairy Plater RCP,RRT,BSRT;Alexavier Tsutsui RN,BSN;Noah Tickle, BS, Exercise Physiologist;Margaret Best, MS, Exercise Physiologist    Virtual Visit No    Medication changes reported     No    Fall or balance concerns reported    No    Warm-up and Cool-down Performed on first and last piece of equipment    Resistance Training Performed Yes    VAD Patient? No    PAD/SET Patient? No      Pain Assessment   Currently in Pain? No/denies             Social History   Tobacco Use  Smoking Status Never  Smokeless Tobacco Never    Goals Met:  Independence with exercise equipment Exercise tolerated well No report of concerns or symptoms today Strength training completed today  Goals Unmet:  Not Applicable  Comments: Pt able to follow exercise prescription today without complaint.  Will continue to monitor for progression.    Dr. Oneil Pinal is Medical Director for [facility] Cardiac Rehabilitation.  Dr. Fuad Aleskerov is Medical Director for [facility] Pulmonary Rehabilitation.

## 2024-03-20 ENCOUNTER — Encounter

## 2024-03-20 DIAGNOSIS — Z955 Presence of coronary angioplasty implant and graft: Secondary | ICD-10-CM | POA: Diagnosis not present

## 2024-03-20 NOTE — Progress Notes (Signed)
 Daily Session Note  Patient Details  Name: Ryan English MRN: [id number] Date of Birth: 10-11-1950 Referring Provider:   Flowsheet Row Cardiac Rehab from 10/19/2023 in [facility] Cardiac and Pulmonary Rehab  Referring Provider Neita Breeding, MD    Encounter Date: 03/20/2024  Check In:  Session Check In - 03/20/24 1119       Check-In   Supervising physician immediately available to respond to emergencies See telemetry face sheet for immediately available ER MD    Location [facility]-Cardiac & Pulmonary Rehab    Staff Present Burnard Davenport RN,BSN,MPA;Margaret Best, MS, Exercise Physiologist;Laureen Delores, BS, RRT, CPFT;Jason Elnor RDN,LDN    Virtual Visit No    Medication changes reported     No    Fall or balance concerns reported    No    Warm-up and Cool-down Performed on first and last piece of equipment    Resistance Training Performed Yes    VAD Patient? No    PAD/SET Patient? No      Pain Assessment   Currently in Pain? No/denies             Social History   Tobacco Use  Smoking Status Never  Smokeless Tobacco Never    Goals Met:  Independence with exercise equipment Exercise tolerated well No report of concerns or symptoms today Strength training completed today  Goals Unmet:  Not Applicable  Comments: Pt able to follow exercise prescription today without complaint.  Will continue to monitor for progression.    Dr. Oneil Pinal is Medical Director for [facility] Cardiac Rehabilitation.  Dr. Fuad Aleskerov is Medical Director for [facility] Pulmonary Rehabilitation.

## 2024-03-21 ENCOUNTER — Other Ambulatory Visit: Payer: Self-pay | Admitting: Family Medicine

## 2024-03-23 ENCOUNTER — Ambulatory Visit

## 2024-03-23 DIAGNOSIS — Z955 Presence of coronary angioplasty implant and graft: Secondary | ICD-10-CM

## 2024-03-23 NOTE — Progress Notes (Signed)
 Daily Session Note  Patient Details  Name: Ryan English MRN: [id number] Date of Birth: August 05, 1950 Referring Provider:   Flowsheet Row Cardiac Rehab from 10/19/2023 in [facility] Cardiac and Pulmonary Rehab  Referring Provider Neita Breeding, MD    Encounter Date: 03/23/2024  Check In:  Session Check In - 03/23/24 1114       Check-In   Supervising physician immediately available to respond to emergencies See telemetry face sheet for immediately available ER MD    Location [facility]-Cardiac & Pulmonary Rehab    Staff Present Devaughn Jaeger, BS, Exercise Physiologist;Margaret Best, MS, Exercise Physiologist;Eudelia Hiltunen RN,BSN;Joseph Rolinda RCP,RRT,BSRT    Virtual Visit No    Medication changes reported     No    Fall or balance concerns reported    No    Warm-up and Cool-down Performed on first and last piece of equipment    Resistance Training Performed Yes    VAD Patient? No    PAD/SET Patient? No      Pain Assessment   Currently in Pain? No/denies             Social History   Tobacco Use  Smoking Status Never  Smokeless Tobacco Never    Goals Met:  Independence with exercise equipment Exercise tolerated well No report of concerns or symptoms today Strength training completed today  Goals Unmet:  Not Applicable  Comments: Pt able to follow exercise prescription today without complaint.  Will continue to monitor for progression.    Dr. Oneil Pinal is Medical Director for [facility] Cardiac Rehabilitation.  Dr. Fuad Aleskerov is Medical Director for [facility] Pulmonary Rehabilitation.

## 2024-03-27 ENCOUNTER — Other Ambulatory Visit: Payer: Self-pay | Admitting: Family Medicine

## 2024-03-27 ENCOUNTER — Encounter

## 2024-03-27 DIAGNOSIS — Z955 Presence of coronary angioplasty implant and graft: Secondary | ICD-10-CM

## 2024-03-27 NOTE — Progress Notes (Signed)
 Daily Session Note  Patient Details  Name: Ryan English MRN: [id number] Date of Birth: [birth year] Referring Provider:   Flowsheet Row Cardiac Rehab from 10/19/2023 in [facility]  Referring Provider Neita Breeding, MD    Encounter Date: 03/27/2024  Check In:  Session Check In - 03/27/24 1112       Check-In   Supervising physician immediately available to respond to emergencies See telemetry face sheet for immediately available ER MD    Location [facility]-Cardiac & Pul[facility] Re[facility]    Staff Present Burnard Davenport RN,BSN,MPA;Meredith Tressa RN,BSN;Mary Peggi, RN, DNP, NE-BC;Maxon Conetta BS, Exercise Physiologist    Virtual Visit No    Medication changes reported     No    Fall or balance concerns reported    No    Warm-up and Cool-down Performed on first and last piece of equipment    Resistance Training Performed Yes    VAD Patient? No      Pain Assessment   Currently in Pain? No/denies             Social History   Tobacco Use  Smoking Status Never  Smokeless Tobacco Never    Goals Met:  Independence with exercise equipment Exercise tolerated well No report of concerns or symptoms today Strength training completed today  Goals Unmet:  Not Applicable  Comments: Pt able to follow exercise prescription today without complaint.  Will continue to monitor for progression.    Dr. Oneil Pinal is Medical Director for [facility] Card[facility] Rehabilitation.  Dr. Fuad Aleskerov is Medical Director for [facility] Pulmonary [facility].

## 2024-03-28 DIAGNOSIS — Z955 Presence of coronary angioplasty implant and graft: Secondary | ICD-10-CM

## 2024-03-28 NOTE — Progress Notes (Signed)
 Cardiac Individual Treatment Plan  Patient Details  Name: Ryan English MRN: [id number] Date of Birth: 1951/05/18 Referring Provider:   Flowsheet Row Cardiac Rehab from 10/19/2023 in [facility] Cardiac and Pulmonary Rehab  Referring Provider Neita Breeding, MD    Initial Encounter Date:  Flowsheet Row Cardiac Rehab from 10/19/2023 in [facility] Cardiac and Pulmonary Rehab  Date 10/19/23    Visit Diagnosis: Status post coronary artery stent placement  Patient's Home Medications on Admission:  Current Outpatient Medications:    acetaminophen  (TYLENOL ) 500 MG tablet, Take by mouth., Disp: , Rfl:    albuterol  (VENTOLIN  HFA) 108 (90 Base) MCG/ACT inhaler, TAKE 2 PUFFS BY MOUTH EVERY 6 HOURS AS NEEDED FOR WHEEZE OR SHORTNESS OF BREATH, Disp: 18 each, Rfl: 0   amLODipine  (NORVASC ) 2.5 MG tablet, Take 1 tablet (2.5 mg total) by mouth daily., Disp: 30 tablet, Rfl: 11   aspirin  EC 81 MG tablet, Take by mouth., Disp: , Rfl:    atorvastatin (LIPITOR) 40 MG tablet, Take 40 mg by mouth daily., Disp: , Rfl:    Dextromethorphan HBr (DELSYM PO), Take by mouth as needed., Disp: , Rfl:    ezetimibe  (ZETIA ) 10 MG tablet, Take 1 tablet (10 mg total) by mouth daily., Disp: 90 tablet, Rfl: 3   ipratropium (ATROVENT ) 0.03 % nasal spray, Place 2 sprays into both nostrils every 12 (twelve) hours., Disp: 30 mL, Rfl: 12   loratadine  (CLARITIN  REDITABS) 10 MG dissolvable tablet, Dissolve 1 tablet (10 mg total) in the mouth daily., Disp: , Rfl:    losartan  (COZAAR ) 50 MG tablet, TAKE 1 TABLET BY MOUTH ONCE DAILY, Disp: 90 tablet, Rfl: 1   Misc Natural Products (OSTEO BI-FLEX TRIPLE STRENGTH) TABS, Take 2 tablets by mouth daily., Disp: , Rfl:    montelukast  (SINGULAIR ) 10 MG tablet, TAKE 1 TABLET BY MOUTH NIGHTLY, Disp: 90 tablet, Rfl: 1   nitroGLYCERIN  (NITROSTAT ) 0.4 MG SL tablet, Place under the tongue., Disp: , Rfl:    omeprazole  (PRILOSEC) 20 MG capsule, Take 1 capsule (20 mg total) by mouth daily., Disp: 30 capsule, Rfl:  3   prasugrel (EFFIENT) 10 MG TABS tablet, Take 1 tablet by mouth daily., Disp: , Rfl:    Pseudoephedrine-guaiFENesin (MUCINEX D PO), Take by mouth as needed., Disp: , Rfl:   Past Medical History: Past Medical History:  Diagnosis Date   Anginal pain    Arthritis    GERD (gastroesophageal reflux disease)    Hyperlipidemia     Tobacco Use: Social History   Tobacco Use  Smoking Status Never  Smokeless Tobacco Never    Labs: Review Flowsheet  More data exists      Latest Ref Rng & Units 02/01/2020 02/02/2021 02/10/2022 02/17/2023 02/20/2024  Labs for ITP Cardiac and Pulmonary Rehab  Cholestrol 100 - 199 mg/dL 872  858  863  871  869   LDL (calc) 0 - 99 mg/dL 63  69  67  61  59   HDL-C >39 mg/dL 47  52  42  44  52   Trlycerides 0 - 149 mg/dL 90  886  845  867  894   Hemoglobin A1c 4.8 - 5.6 % - 5.7  5.5  5.5  5.6      Exercise Target Goals: Exercise Program Goal: Individual exercise prescription set using results from initial 6 min walk test and THRR while considering  patient's activity barriers and safety.   Exercise Prescription Goal: Initial exercise prescription builds to 30-45 minutes a day of aerobic  activity, 2-3 days per week.  Home exercise guidelines will be given to patient during program as part of exercise prescription that the participant will acknowledge.   Education: Aerobic Exercise: - Group verbal and visual presentation on the components of exercise prescription. Introduces F.I.T.T principle from ACSM for exercise prescriptions.  Reviews F.I.T.T. principles of aerobic exercise including progression. Written material provided at class time. Flowsheet Row Cardiac Rehab from 12/08/2023 in [facility] Cardiac and Pulmonary Rehab  Date 12/01/23  Educator MB  Instruction Review Code 1- Verbalizes Understanding    Education: Resistance Exercise: - Group verbal and visual presentation on the components of exercise prescription. Introduces F.I.T.T principle from ACSM for  exercise prescriptions  Reviews F.I.T.T. principles of resistance exercise including progression. Written material provided at class time.    Education: Exercise & Equipment Safety: - Individual verbal instruction and demonstration of equipment use and safety with use of the equipment. Flowsheet Row Cardiac Rehab from 12/08/2023 in [facility] Cardiac and Pulmonary Rehab  Date 10/12/23  Educator jh  Instruction Review Code 1- Verbalizes Understanding    Education: Exercise Physiology & General Exercise Guidelines: - Group verbal and written instruction with models to review the exercise physiology of the cardiovascular system and associated critical values. Provides general exercise guidelines with specific guidelines to those with heart or lung disease. Written material provided at class time. Flowsheet Row Cardiac Rehab from 12/08/2023 in [facility] Cardiac and Pulmonary Rehab  Date 11/17/23  Educator MB  Instruction Review Code 1- [employer] Understanding    Education: Flexibility, Balance, Mind/Body Relaxation: - Group verbal and visual presentation with interactive activity on the components of exercise prescription. Introduces F.I.T.T principle from ACSM for exercise prescriptions. Reviews F.I.T.T. principles of flexibility and balance exercise training including progression. Also discusses the mind body connection.  Reviews various relaxation techniques to help reduce and manage stress (i.e. Deep breathing, progressive muscle relaxation, and visualization). Balance handout provided to take home. Written material provided at class time.   Activity Barriers & Risk Stratification:  Activity Barriers & Cardiac Risk Stratification - 10/19/23 1135       Activity Barriers & Cardiac Risk Stratification   Activity Barriers Other (comment);Neck/Spine Problems;Arthritis    Comments Right and Left knees need to be replaced.    Cardiac Risk Stratification High          6 Minute Walk:  6 Minute Walk      Row Name 10/19/23 1131 03/13/24 0935       6 Minute Walk   Phase Initial Discharge    Distance 1135 feet 1320 feet    Distance % Change -- 16 %    Distance Feet Change -- 185 ft    Walk Time 6 minutes 6 minutes    # of Rest Breaks 0 0    MPH 2.15 2.5    METS 2.42 2.69    RPE 11 10    Perceived Dyspnea  0 1    VO2 Peak 8.47 9.4    Symptoms Yes (comment) Yes (comment)    Comments knee soreness knee pain- constant    Resting HR 66 bpm 70 bpm    Resting BP 136/70 122/76    Resting Oxygen Saturation  97 % 96 %    Exercise Oxygen Saturation  during 6 min walk 97 % 92 %    Max Ex. HR 95 bpm 103 bpm    Max Ex. BP 144/72 126/64    2 Minute Post BP 128/70 --  Oxygen Initial Assessment:   Oxygen Re-Evaluation:   Oxygen Discharge (Final Oxygen Re-Evaluation):   Initial Exercise Prescription:  Initial Exercise Prescription - 10/19/23 1100       Date of Initial Exercise RX and Referring Provider   Date 10/19/23    Referring Provider Neita Breeding, MD      Oxygen   Maintain Oxygen Saturation 88% or higher      Treadmill   MPH 2.2    Grade 0    Minutes 15    METs 2.68      NuStep   Level 3    SPM 80    Minutes 15    METs 2.42      REL-XR   Level 2    Speed 50    Minutes 15    METs 2.42      Prescription Details   Frequency (times per week) 2    Duration Progress to 30 minutes of continuous aerobic without signs/symptoms of physical distress      Intensity   THRR 40-80% of Max Heartrate 98-130    Ratings of Perceived Exertion 11-13    Perceived Dyspnea 0-4      Progression   Progression Continue to progress workloads to maintain intensity without signs/symptoms of physical distress.      Resistance Training   Training Prescription Yes    Weight 10 lb    Reps 10-15          Perform Capillary Blood Glucose checks as needed.  Exercise Prescription Changes:   Exercise Prescription Changes     Row Name 10/19/23 1100 11/10/23 1400 11/24/23  1700 12/08/23 1600 12/21/23 1400     Response to Exercise   Blood Pressure (Admit) 136/70 98/62 98/60  122/66 128/76   Blood Pressure (Exercise) 144/72 132/60 134/60 144/64 --   Blood Pressure (Exit) 128/70 100/60 102/80 120/64 122/70   Heart Rate (Admit) 66 bpm 81 bpm 75 bpm 99 bpm 91 bpm   Heart Rate (Exercise) 95 bpm 110 bpm 113 bpm 109 bpm 104 bpm   Heart Rate (Exit) 58 bpm 81 bpm 69 bpm 74 bpm 74 bpm   Oxygen Saturation (Admit) 97 % -- -- -- --   Oxygen Saturation (Exercise) 97 % -- -- -- --   Rating of Perceived Exertion (Exercise) 11 13 15 15 14    Perceived Dyspnea (Exercise) 0 -- -- -- --   Symptoms knee soreness none none none none   Comments Results First two weeks of exercise -- -- --   Duration -- Continue with 30 min of aerobic exercise without signs/symptoms of physical distress. Continue with 30 min of aerobic exercise without signs/symptoms of physical distress. Continue with 30 min of aerobic exercise without signs/symptoms of physical distress. Continue with 30 min of aerobic exercise without signs/symptoms of physical distress.   Intensity -- THRR unchanged THRR unchanged THRR unchanged THRR unchanged     Progression   Progression -- Continue to progress workloads to maintain intensity without signs/symptoms of physical distress. Continue to progress workloads to maintain intensity without signs/symptoms of physical distress. Continue to progress workloads to maintain intensity without signs/symptoms of physical distress. Continue to progress workloads to maintain intensity without signs/symptoms of physical distress.   Average METs -- 3.22 4.25 3.55 3.54     Resistance Training   Training Prescription -- Yes Yes Yes Yes   Weight -- 10 lb 10 lb 10 lb 10 lb   Reps -- 10-15 10-15 10-15 10-15  Interval Training   Interval Training -- No No No No     Treadmill   MPH -- 2.5 2.5 2.5 2.5   Grade -- 1 1.5 1 0   Minutes -- 15 15 15 15    METs -- 3.26 3.43 3.26 2.91      NuStep   Level -- 6 -- 7  T6 --   Minutes -- 15 -- 15 --   METs -- 5.5 -- 3.4 --     REL-XR   Level -- 2 4 10 8    Minutes -- 15 15 15 15    METs -- -- 9 6.1 3.8     T5 Nustep   Level -- 5 6 -- --   Minutes -- 15 15 -- --   METs -- 3 3.9 -- --     Oxygen   Maintain Oxygen Saturation -- 88% or higher 88% or higher 88% or higher 88% or higher    Row Name 01/03/24 1500 01/17/24 1500 01/31/24 1300 02/16/24 0800 03/01/24 1600     Response to Exercise   Blood Pressure (Admit) 118/64 118/64 104/62 126/68 108/54   Blood Pressure (Exit) 124/68 102/62 122/66 110/60 94/54   Heart Rate (Admit) 69 bpm 73 bpm 55 bpm 95 bpm 84 bpm   Heart Rate (Exercise) 127 bpm 114 bpm 115 bpm 115 bpm 106 bpm   Heart Rate (Exit) 88 bpm 93 bpm 84 bpm 81 bpm 92 bpm   Rating of Perceived Exertion (Exercise) 16 14 14 14 12    Symptoms none none none none none   Duration Continue with 30 min of aerobic exercise without signs/symptoms of physical distress. Continue with 30 min of aerobic exercise without signs/symptoms of physical distress. Continue with 30 min of aerobic exercise without signs/symptoms of physical distress. Continue with 30 min of aerobic exercise without signs/symptoms of physical distress. Continue with 30 min of aerobic exercise without signs/symptoms of physical distress.   Intensity THRR unchanged THRR unchanged THRR unchanged THRR unchanged THRR unchanged     Progression   Progression Continue to progress workloads to maintain intensity without signs/symptoms of physical distress. Continue to progress workloads to maintain intensity without signs/symptoms of physical distress. Continue to progress workloads to maintain intensity without signs/symptoms of physical distress. Continue to progress workloads to maintain intensity without signs/symptoms of physical distress. Continue to progress workloads to maintain intensity without signs/symptoms of physical distress.   Average METs 4.09 4.87  4.46 4.33 2.91     Resistance Training   Training Prescription Yes Yes Yes Yes Yes   Weight 10 lb 10 lb 10 lb 10 lb 10 lb   Reps 10-15 10-15 10-15 10-15 10-15     Interval Training   Interval Training No No No No No     Treadmill   MPH 2.5 2.8 2.5 2.5 2.5   Grade 3.5 0 4.5 6.5 0   Minutes 15 15 15 15 15    METs 4.12 3.14 4.47 5.15 1.91     NuStep   Level -- 6 -- 5 --   Minutes -- 15 -- 15 --   METs -- 6.6 -- 5.1 --     REL-XR   Level -- -- 7 6 6    Minutes -- -- 15 15 15    METs -- -- 5.3 4.9 --     Biostep-RELP   Level 8 -- -- -- --   Minutes 15 -- -- -- --   METs 5 -- -- -- --  Rower   Level 5 -- -- -- --   Watts 55 -- -- -- --   Minutes 15 -- -- -- --     Oxygen   Maintain Oxygen Saturation 88% or higher 88% or higher 88% or higher 88% or higher 88% or higher      Exercise Comments:   Exercise Comments     Row Name 10/24/23 0805           Exercise Comments First full day of exercise!  Patient was oriented to gym and equipment including functions, settings, policies, and procedures.  Patient's individual exercise prescription and treatment plan were reviewed.  All starting workloads were established based on the results of the 6 minute walk test done at initial orientation visit.  The plan for exercise progression was also introduced and progression will be customized based on patient's performance and goals.          Exercise Goals and Review:   Exercise Goals     Row Name 10/19/23 1135             Exercise Goals   Increase Physical Activity Yes       Intervention Provide advice, education, support and counseling about physical activity/exercise needs.;Develop an individualized exercise prescription for aerobic and resistive training based on initial evaluation findings, risk stratification, comorbidities and participant's personal goals.       Expected Outcomes Short Term: Attend rehab on a regular basis to increase amount of physical  activity.;Long Term: Add in home exercise to make exercise part of routine and to increase amount of physical activity.;Long Term: Exercising regularly at least 3-5 days a week.       Increase Strength and Stamina Yes       Intervention Provide advice, education, support and counseling about physical activity/exercise needs.;Develop an individualized exercise prescription for aerobic and resistive training based on initial evaluation findings, risk stratification, comorbidities and participant's personal goals.       Expected Outcomes Short Term: Increase workloads from initial exercise prescription for resistance, speed, and METs.;Short Term: Perform resistance training exercises routinely during rehab and add in resistance training at home;Long Term: Improve cardiorespiratory fitness, muscular endurance and strength as measured by increased METs and functional capacity ( )       Able to understand and use rate of perceived exertion (RPE) scale Yes       Intervention Provide education and explanation on how to use RPE scale       Expected Outcomes Short Term: Able to use RPE daily in rehab to express subjective intensity level;Long Term:  Able to use RPE to guide intensity level when exercising independently       Able to understand and use Dyspnea scale Yes       Intervention Provide education and explanation on how to use Dyspnea scale       Expected Outcomes Short Term: Able to use Dyspnea scale daily in rehab to express subjective sense of shortness of breath during exertion;Long Term: Able to use Dyspnea scale to guide intensity level when exercising independently       Knowledge and understanding of Target Heart Rate Range (THRR) Yes       Intervention Provide education and explanation of THRR including how the numbers were predicted and where they are located for reference       Expected Outcomes Long Term: Able to use THRR to govern intensity when exercising independently;Short Term: Able to  state/look up THRR;Short Term: Able to  use daily as guideline for intensity in rehab       Able to check pulse independently Yes       Intervention Provide education and demonstration on how to check pulse in carotid and radial arteries.;Review the importance of being able to check your own pulse for safety during independent exercise       Expected Outcomes Short Term: Able to explain why pulse checking is important during independent exercise;Long Term: Able to check pulse independently and accurately       Understanding of Exercise Prescription Yes       Intervention Provide education, explanation, and written materials on patient's individual exercise prescription       Expected Outcomes Short Term: Able to explain program exercise prescription;Long Term: Able to explain home exercise prescription to exercise independently          Exercise Goals Re-Evaluation :  Exercise Goals Re-Evaluation     Row Name 10/24/23 0805 11/10/23 1434 11/24/23 1759 12/08/23 1627 12/12/23 0751     Exercise Goal Re-Evaluation   Exercise Goals Review Able to understand and use rate of perceived exertion (RPE) scale;Knowledge and understanding of Target Heart Rate Range (THRR);Understanding of Exercise Prescription;Able to understand and use Dyspnea scale Increase Strength and Stamina;Increase Physical Activity;Understanding of Exercise Prescription Increase Strength and Stamina;Increase Physical Activity;Understanding of Exercise Prescription Increase Strength and Stamina;Increase Physical Activity;Understanding of Exercise Prescription Increase Physical Activity;Increase Strength and Stamina;Understanding of Exercise Prescription   Comments Reviewed RPE and dyspnea scale, THR and program prescription with pt today.  Pt voiced understanding and was given a copy of goals to take home. Ryan English is off to a good start in the program. He did well on the treadmill at a speed of 2.5 mph with an incline of 1%. He also  improved to level 6 on the T4 nustep and level 5 on the T5 nustep. We will continue to monitor his progress in the program. Ryan English is doing well in rehab. He increased his workload on the treadmill to a speed of 2.5 mph and incline of 1.5%. He also increased to level 4 on the XR and level 6 on the T5 nustep. We will continue to monitor his progress in the program. Ryan English continues to do well in rehab. He continues to work on the treadmill at a speed of 2.5 mph with an incline of 1%. He also improved to level 10 on the XR and level 7 on the T6 nustep. We will continue to monitor his progress in the program. Ryan English is doing well in rehab. He is walking some at home, most of his activity is yard work.   Expected Outcomes Short: Use RPE daily to regulate intensity. Long: Follow program prescription in THR. Short: Continue to follow current exercise prescription. Long: Continue exercise to improve strength and stamina. Short: Continue to progressively increase treadmill and XR workloads. Long: Continue exercise to improve strength and stamina. Short: Continue to progressively increase workloads. Long: Continue exercise to improve strength and stamina. STG: Continue to attended rehab and increase workloads as able.    Row Name 12/21/23 1437 01/03/24 1514 01/17/24 1522 01/31/24 1339 02/16/24 0807     Exercise Goal Re-Evaluation   Exercise Goals Review Increase Physical Activity;Increase Strength and Stamina;Understanding of Exercise Prescription Increase Physical Activity;Increase Strength and Stamina;Understanding of Exercise Prescription Increase Physical Activity;Increase Strength and Stamina;Understanding of Exercise Prescription Increase Physical Activity;Increase Strength and Stamina;Understanding of Exercise Prescription Increase Physical Activity;Increase Strength and Stamina;Understanding of Exercise Prescription   Comments  Ryan English continues to do well in rehab. He was able to maintain his workload  on the treadmill at a speed of 2.52mph. He was also able to use the XR at level 8. We will continue to monitor his progress in the program. Ryan English has only attended two sessions since the last review. He was able to increase his treadmill workload to a speed of 2.5 mph with an incline of 3.5%. He also began using the biostep at level 8 and the rower at level 5. We will continue to monitor his progress in the program. Ryan English is doing well in rehab. He was only able to attend one session during this review period. During his session he was able to increase his speed on the treadmill from 2.5 to 2.31mph. We will continue to monitor his progress in the program. Ryan English is doing well in rehab but has only attended two sessions. He did increase his workload on the treadmill to a speed of 2.5 mph with an incline of 4.5%. He also continues to work at level 7 on the XR. We will continue to monitor his progress in the program. Ryan English is doing well in rehab. He is due for his post soon and hopes to improve. He increased his workload on the treadmill to a speed of 2.5 mph with 6.5% incline. He worked at level 6 on the XR and level 5 on the T4 nustep.. We will continue to monitor his progress in the program.   Expected Outcomes Short: Try to increase treadmill incline to 2%. Long: Continue exercise to improve strength and stamina. Short: Attend rehab more consistently. Long: Continue exercise to improve strength and stamina. Short: Continue to increase treadmill workload, attend rehab more regularly. Long: Continue exercise to improve strength and stamina. Short: Attend rehab more regularly. Long: Continue exercise to improve strength and stamina. Short: Improve on post . Long: Continue to increase overall METs and stamina.    Row Name 03/01/24 1613 03/15/24 0757 03/27/24 1123         Exercise Goal Re-Evaluation   Exercise Goals Review Increase Physical Activity;Increase Strength and Stamina;Understanding  of Exercise Prescription Increase Physical Activity;Increase Strength and Stamina;Understanding of Exercise Prescription Increase Physical Activity;Increase Strength and Stamina;Understanding of Exercise Prescription     Comments Ryan English has only attended one session since the last review. He is due for his post soon and will look to improve on it. We will continue to monitor his progress in the program. Ryan English did not attend rehab during the last review period. He did return for one session of rehab on 03/13/2024. During this session he was able to complete his post andimproved by 16%. We will continue to monitor his progress in the program. Ryan English is doign well at [employer], he is feeling much better since starting the program, plans to join gym after finishing rehab     Expected Outcomes Short: Return to rehab and improve on post . Long: Continue to increase overall METs and stamina. Short: Attend rehab consistently until graduating from the program. Long: Continue to exercise independently. STG: join gym for exercise plan after rehab. LTG: Continue to exercise independently.        Discharge Exercise Prescription (Final Exercise Prescription Changes):  Exercise Prescription Changes - 03/01/24 1600       Response to Exercise   Blood Pressure (Admit) 108/54    Blood Pressure (Exit) 94/54    Heart Rate (Admit) 84 bpm    Heart Rate (Exercise) 106  bpm    Heart Rate (Exit) 92 bpm    Rating of Perceived Exertion (Exercise) 12    Symptoms none    Duration Continue with 30 min of aerobic exercise without signs/symptoms of physical distress.    Intensity THRR unchanged      Progression   Progression Continue to progress workloads to maintain intensity without signs/symptoms of physical distress.    Average METs 2.91      Resistance Training   Training Prescription Yes    Weight 10 lb    Reps 10-15      Interval Training   Interval Training No      Treadmill   MPH 2.5     Grade 0    Minutes 15    METs 1.91      REL-XR   Level 6    Minutes 15      Oxygen   Maintain Oxygen Saturation 88% or higher          Nutrition:  Target Goals: Understanding of nutrition guidelines, daily intake of sodium 1500mg , cholesterol 200mg , calories 30% from fat and 7% or less from saturated fats, daily to have 5 or more servings of fruits and vegetables.  Education: Nutrition 1 -Group instruction provided by verbal, written material, interactive activities, discussions, models, and posters to present general guidelines for heart healthy nutrition including macronutrients, label reading, and promoting whole foods over processed counterparts. Education serves as Pensions consultant of discussion of heart healthy eating for all. Written material provided at class time.    Education: Nutrition 2 -Group instruction provided by verbal, written material, interactive activities, discussions, models, and posters to present general guidelines for heart healthy nutrition including sodium, cholesterol, and saturated fat. Providing guidance of habit forming to improve blood pressure, cholesterol, and body weight. Written material provided at class time.     Biometrics:  Pre Biometrics - 10/19/23 1136       Pre Biometrics   Height 5' 10.5 (1.791 m)    Weight 208 lb 4.8 oz (94.5 kg)    Waist Circumference 39 inches    Hip Circumference 40 inches    Waist to Hip Ratio 0.98 %    BMI (Calculated) 29.46    Single Leg Stand 6.55 seconds          Post Biometrics - 03/13/24 9061        Post  Biometrics   Height 5' 10.5 (1.791 m)    Weight 208 lb 4.8 oz (94.5 kg)    Waist Circumference 40.5 inches    Hip Circumference 39 inches    Waist to Hip Ratio 1.04 %    BMI (Calculated) 29.46    Single Leg Stand 14.75 seconds          Nutrition Therapy Plan and Nutrition Goals:  Nutrition Therapy & Goals - 10/19/23 1130       Nutrition Therapy   RD appointment deferred Yes       Intervention Plan   Intervention Prescribe, educate and counsel regarding individualized specific dietary modifications aiming towards targeted core components such as weight, hypertension, lipid management, diabetes, heart failure and other comorbidities.    Expected Outcomes Short Term Goal: Understand basic principles of dietary content, such as calories, fat, sodium, cholesterol and nutrients.;Short Term Goal: A plan has been developed with personal nutrition goals set during dietitian appointment.;Long Term Goal: Adherence to prescribed nutrition plan.          Nutrition Assessments:  MEDIFICTS Score Key: >=70 Need  to make dietary changes  40-70 Heart Healthy Diet <= 40 Therapeutic Level Cholesterol Diet  Flowsheet Row Cardiac Rehab from 03/15/2024 in [facility] Cardiac and Pulmonary Rehab  Picture Your Plate Total Score on Discharge 64   Picture Your Plate Scores: <59 Unhealthy dietary pattern with much room for improvement. 41-50 Dietary pattern unlikely to meet recommendations for good health and room for improvement. 51-60 More healthful dietary pattern, with some room for improvement.  >60 Healthy dietary pattern, although there may be some specific behaviors that could be improved.    Nutrition Goals Re-Evaluation:  Nutrition Goals Re-Evaluation     Row Name 12/12/23 0753             Goals   Comment Deferred RD appointment          Nutrition Goals Discharge (Final Nutrition Goals Re-Evaluation):  Nutrition Goals Re-Evaluation - 12/12/23 0753       Goals   Comment Deferred RD appointment          Psychosocial: Target Goals: Acknowledge presence or absence of significant depression and/or stress, maximize coping skills, provide positive support system. Participant is able to verbalize types and ability to use techniques and skills needed for reducing stress and depression.   Education: Stress, Anxiety, and Depression - Group verbal and visual presentation to  define topics covered.  Reviews how body is impacted by stress, anxiety, and depression.  Also discusses healthy ways to reduce stress and to treat/manage anxiety and depression. Written material provided at class time. Flowsheet Row Cardiac Rehab from 12/08/2023 in [facility] Cardiac and Pulmonary Rehab  Date 11/10/23  Educator SB  Instruction Review Code 1- [employer] Understanding    Education: Sleep Hygiene -Provides group verbal and written instruction about how sleep can affect your health.  Define sleep hygiene, discuss sleep cycles and impact of sleep habits. Review good sleep hygiene tips.   Initial Review & Psychosocial Screening:  Initial Psych Review & Screening - 10/12/23 1426       Initial Review   Current issues with None Identified      Family Dynamics   Good Support System? Yes    Comments He can look to his wife and two sons for support. He states no issues with his mental state and does not take any medication for his mood.      Barriers   Psychosocial barriers to participate in program The patient should benefit from training in stress management and relaxation.;There are no identifiable barriers or psychosocial needs.      Screening Interventions   Interventions Provide feedback about the scores to participant;Program counselor consult;To provide support and resources with identified psychosocial needs    Expected Outcomes Short Term goal: Utilizing psychosocial counselor, staff and physician to assist with identification of specific Stressors or current issues interfering with healing process. Setting desired goal for each stressor or current issue identified.;Long Term Goal: Stressors or current issues are controlled or eliminated.;Short Term goal: Identification and review with participant of any Quality of Life or Depression concerns found by scoring the questionnaire.;Long Term goal: The participant improves quality of Life and PHQ9 Scores as seen by post scores and/or  verbalization of changes          Quality of Life Scores:   Quality of Life - 03/15/24 1409       Quality of Life   Select Quality of Life      Quality of Life Scores   Health/Function Post 22.47 %  Socioeconomic Post 22.21 %    Psych/Spiritual Post 22.5 %    Family Post 24.9 %    GLOBAL Post 22.78 %         Scores of 19 and below usually indicate a poorer quality of life in these areas.  A difference of  2-3 points is a clinically meaningful difference.  A difference of 2-3 points in the total score of the Quality of Life Index has been associated with significant improvement in overall quality of life, self-image, physical symptoms, and general health in studies assessing change in quality of life.  PHQ-9: Review Flowsheet  More data exists      03/15/2024 02/20/2024 01/24/2024 10/19/2023 08/18/2023  Depression screen PHQ 2/9  Decreased Interest 0 0 0 0 0  Down, Depressed, Hopeless 0 0 0 0 0  PHQ - 2 Score 0 0 0 0 0  Altered sleeping 0 0 0 0 -  Tired, decreased energy 0 0 0 0 -  Change in appetite 0 0 0 0 -  Feeling bad or failure about yourself  0 0 0 0 -  Trouble concentrating 0 0 0 0 -  Moving slowly or fidgety/restless 0 0 0 0 -  Suicidal thoughts 0 0 0 0 -  PHQ-9 Score 0 0 0 0 -  Difficult doing work/chores Not difficult at all Not difficult at all Not difficult at all - -   Interpretation of Total Score  Total Score Depression Severity:  1-4 = Minimal depression, 5-9 = Mild depression, 10-14 = Moderate depression, 15-19 = Moderately severe depression, 20-27 = Severe depression   Psychosocial Evaluation and Intervention:  Psychosocial Evaluation - 10/12/23 1427       Psychosocial Evaluation & Interventions   Interventions Encouraged to exercise with the program and follow exercise prescription;Relaxation education;Stress management education    Comments He can look to his wife and two sons for support. He states no issues with his mental state and does not  take any medication for his mood.    Expected Outcomes Short: Start [facility] to help with mood. Long: Maintain a healthy mental state    Continue Psychosocial Services  Follow up required by staff          Psychosocial Re-Evaluation:  Psychosocial Re-Evaluation     Row Name 12/12/23 0752 03/27/24 1125           Psychosocial Re-Evaluation   Current issues with None Identified None Identified      Comments Ryan English reports he is sleeping well, 6-8hr each night. Denies any stress, anxiety, or depression. Ryan English denies any anxiety, depression or stress at this time.He reports he is sleeping well      Expected Outcomes STG: Continue to attended rehab. LTG: Achieve and maintain a positive outlook on health and daily life STG: Graduate LTG:  Achieve and maintain a positive outlook on health and daily life      Interventions Encouraged to attend Cardiac Rehabilitation for the exercise Encouraged to attend Cardiac Rehabilitation for the exercise      Continue Psychosocial Services  Follow up required by staff Follow up required by staff         Psychosocial Discharge (Final Psychosocial Re-Evaluation):  Psychosocial Re-Evaluation - 03/27/24 1125       Psychosocial Re-Evaluation   Current issues with None Identified    Comments Ryan English denies any anxiety, depression or stress at this time.He reports he is sleeping well    Expected Outcomes STG: Graduate LTG:  Achieve  and maintain a positive outlook on health and daily life    Interventions Encouraged to attend Cardiac Rehabilitation for the exercise    Continue Psychosocial Services  Follow up required by staff          Vocational Rehabilitation: Provide vocational rehab assistance to qualifying candidates.   Vocational Rehab Evaluation & Intervention:   Education: Education Goals: Education classes will be provided on a variety of topics geared toward better understanding of heart health and risk factor modification.  Participant will state understanding/return demonstration of topics presented as noted by education test scores.  Learning Barriers/Preferences:  Learning Barriers/Preferences - 10/12/23 1426       Learning Barriers/Preferences   Learning Barriers None    Learning Preferences None          General Cardiac Education Topics:  AED/CPR: - Group verbal and written instruction with the use of models to demonstrate the basic use of the AED with the basic ABC's of resuscitation.   Test and Procedures: - Group verbal and visual presentation and models provide information about basic cardiac anatomy and function. Reviews the testing methods done to diagnose heart disease and the outcomes of the test results. Describes the treatment choices: Medical Management, Angioplasty, or Coronary Bypass Surgery for treating various heart conditions including Myocardial Infarction, Angina, Valve Disease, and Cardiac Arrhythmias. Written material provided at class time.   Medication Safety: - Group verbal and visual instruction to review commonly prescribed medications for heart and lung disease. Reviews the medication, class of the drug, and side effects. Includes the steps to properly store meds and maintain the prescription regimen. Written material provided at class time.   Intimacy: - Group verbal instruction through game format to discuss how heart and lung disease can affect sexual intimacy. Written material provided at class time. Flowsheet Row Cardiac Rehab from 12/08/2023 in [facility] Cardiac and Pulmonary Rehab  Date 12/01/23  Educator MB  Instruction Review Code 1- Verbalizes Understanding    Know Your Numbers and Heart Failure: - Group verbal and visual instruction to discuss disease risk factors for cardiac and pulmonary disease and treatment options.  Reviews associated critical values for Overweight/Obesity, Hypertension, Cholesterol, and Diabetes.  Discusses basics of heart failure:  signs/symptoms and treatments.  Introduces Heart Failure Zone chart for action plan for heart failure. Written material provided at class time.   Infection Prevention: - Provides verbal and written material to individual with discussion of infection control including proper hand washing and proper equipment cleaning during exercise session. Flowsheet Row Cardiac Rehab from 12/08/2023 in [facility] Cardiac and Pulmonary Rehab  Date 10/12/23  Educator jh  Instruction Review Code 1- Verbalizes Understanding    Falls Prevention: - Provides verbal and written material to individual with discussion of falls prevention and safety. Flowsheet Row Cardiac Rehab from 12/08/2023 in [facility] Cardiac and Pulmonary Rehab  Date 10/12/23  Educator jh  Instruction Review Code 1- Verbalizes Understanding    Other: -Provides group and verbal instruction on various topics (see comments)   Knowledge Questionnaire Score:  Knowledge Questionnaire Score - 03/15/24 1407       Knowledge Questionnaire Score   Post Score 22/26          Core Components/Risk Factors/Patient Goals at Admission:  Personal Goals and Risk Factors at Admission - 10/12/23 1425       Core Components/Risk Factors/Patient Goals on Admission    Weight Management Yes;Weight Maintenance;Weight Loss    Intervention Weight Management: Develop a combined nutrition and exercise program  designed to reach desired caloric intake, while maintaining appropriate intake of nutrient and fiber, sodium and fats, and appropriate energy expenditure required for the weight goal.;Weight Management: Provide education and appropriate resources to help participant work on and attain dietary goals.;Weight Management/Obesity: Establish reasonable short term and long term weight goals.    Expected Outcomes Short Term: Continue to assess and modify interventions until short term weight is achieved;Weight Maintenance: Understanding of the daily nutrition guidelines, which  includes 25-35% calories from fat, 7% or less cal from saturated fats, less than 200mg  cholesterol, less than 1.5gm of sodium, & 5 or more servings of fruits and vegetables daily;Understanding recommendations for meals to include 15-35% energy as protein, 25-35% energy from fat, 35-60% energy from carbohydrates, less than 200mg  of dietary cholesterol, 20-35 gm of total fiber daily;Weight Loss: Understanding of general recommendations for a balanced deficit meal plan, which promotes 1-2 lb weight loss per week and includes a negative energy balance of [phone] kcal/d;Understanding of distribution of calorie intake throughout the day with the consumption of 4-5 meals/snacks    Heart Failure Yes    Intervention Provide a combined exercise and nutrition program that is supplemented with education, support and counseling about heart failure. Directed toward relieving symptoms such as shortness of breath, decreased exercise tolerance, and extremity edema.    Expected Outcomes Improve functional capacity of life;Short term: Attendance in program 2-3 days a week with increased exercise capacity. Reported lower sodium intake. Reported increased fruit and vegetable intake. Reports medication compliance.;Short term: Daily weights obtained and reported for increase. Utilizing diuretic protocols set by physician.;Long term: Adoption of self-care skills and reduction of barriers for early signs and symptoms recognition and intervention leading to self-care maintenance.    Hypertension Yes    Intervention Provide education on lifestyle modifcations including regular physical activity/exercise, weight management, moderate sodium restriction and increased consumption of fresh fruit, vegetables, and low fat dairy, alcohol moderation, and smoking cessation.;Monitor prescription use compliance.    Expected Outcomes Short Term: Continued assessment and intervention until BP is < 140/67mm HG in hypertensive participants. < 130/64mm  HG in hypertensive participants with diabetes, heart failure or chronic kidney disease.;Long Term: Maintenance of blood pressure at goal levels.    Lipids Yes    Intervention Provide education and support for participant on nutrition & aerobic/resistive exercise along with prescribed medications to achieve LDL 70mg , HDL >40mg .    Expected Outcomes Short Term: Participant states understanding of desired cholesterol values and is compliant with medications prescribed. Participant is following exercise prescription and nutrition guidelines.;Long Term: Cholesterol controlled with medications as prescribed, with individualized exercise RX and with personalized nutrition plan. Value goals: LDL < 70mg , HDL > 40 mg.          Education:Diabetes - Individual verbal and written instruction to review signs/symptoms of diabetes, desired ranges of glucose level fasting, after meals and with exercise. Acknowledge that pre and post exercise glucose checks will be done for 3 sessions at entry of program.   Core Components/Risk Factors/Patient Goals Review:   Goals and Risk Factor Review     Row Name 12/12/23 0753 03/27/24 1127           Core Components/Risk Factors/Patient Goals Review   Personal Goals Review Hypertension Hypertension      Review Ryan English has a BP cuff at home and checks his BP daily, reports it is consistent and matches readings taken here at rehab. Ryan English reports he checks his BP at home and takes his meds as  prescribed      Expected Outcomes STG: Continue to check BP at home. LTG: Manage risk factors independently STG: Continue to check BP at home. LTG: Manage risk factors independently         Core Components/Risk Factors/Patient Goals at Discharge (Final Review):   Goals and Risk Factor Review - 03/27/24 1127       Core Components/Risk Factors/Patient Goals Review   Personal Goals Review Hypertension    Review Ryan English reports he checks his BP at home and takes his meds as  prescribed    Expected Outcomes STG: Continue to check BP at home. LTG: Manage risk factors independently          ITP Comments:  ITP Comments     Row Name 10/12/23 1425 10/19/23 1129 10/24/23 0804 11/09/23 1311 12/07/23 1107   ITP Comments Virtual Visit completed. Patient informed on EP and RD appointment and 6 Minute walk test. Patient also informed of patient health questionnaires on My Chart. Patient Verbalizes understanding. Visit diagnosis can be found in [facility] 09/23/2023. Patient is to bring in medication list. Completed and gym orientation for cardiac rehab. Initial ITP created and sent for review to Dr. Oneil Pinal, Medical Director. First full day of exercise!  Patient was oriented to gym and equipment including functions, settings, policies, and procedures.  Patient's individual exercise prescription and treatment plan were reviewed.  All starting workloads were established based on the results of the 6 minute walk test done at initial orientation visit.  The plan for exercise progression was also introduced and progression will be customized based on patient's performance and goals. 30 Day review completed. Medical Director ITP review done, changes made as directed, and signed approval by Medical Director. New to program. 30 Day review completed. Medical Director ITP review done, changes made as directed, and signed approval by Medical Director.    Row Name 01/04/24 1115 02/01/24 1013 02/29/24 1049 03/28/24 1122     ITP Comments 30 Day review completed. Medical Director ITP review done, changes made as directed, and signed approval by Medical Director. 30 Day review completed. Medical Director ITP review done, changes made as directed, and signed approval by Medical Director. 30 Day review completed. Medical Director ITP review done, changes made as directed, and signed approval by Medical Director. 30 Day review completed. Medical Director ITP review done, changes made as directed, and  signed approval by Medical Director.       Comments: 30 day review

## 2024-03-29 ENCOUNTER — Encounter: Admitting: Emergency Medicine

## 2024-03-29 DIAGNOSIS — Z955 Presence of coronary angioplasty implant and graft: Secondary | ICD-10-CM | POA: Diagnosis not present

## 2024-03-29 NOTE — Progress Notes (Signed)
 Daily Session Note  Patient Details  Name: Ryan English MRN: [id number] Date of Birth: 05/28/1951 Referring Provider:   Flowsheet Row Cardiac Rehab from 10/19/2023 in [facility] Cardiac and Pulmonary Rehab  Referring Provider Neita Breeding, MD    Encounter Date: 03/29/2024  Check In:  Session Check In - 03/29/24 1110       Check-In   Supervising physician immediately available to respond to emergencies See telemetry face sheet for immediately available ER MD    Location [facility]-Cardiac & Pulmonary Rehab    Staff Present Devaughn Jaeger, BS, Exercise Physiologist;Lenka Zhao RN,BSN;Joseph Hood RCP,RRT,BSRT;[place] BS, Exercise Physiologist    Virtual Visit No    Medication changes reported     No    Fall or balance concerns reported    No    Warm-up and Cool-down Performed on first and last piece of equipment    Resistance Training Performed Yes    VAD Patient? No    PAD/SET Patient? No      Pain Assessment   Currently in Pain? No/denies             Social History   Tobacco Use  Smoking Status Never  Smokeless Tobacco Never    Goals Met:  Independence with exercise equipment Exercise tolerated well No report of concerns or symptoms today Strength training completed today  Goals Unmet:  Not Applicable  Comments:  Ashlin graduated today from  rehab with 36 sessions completed.  Details of the patient's exercise prescription and what He needs to do in order to continue the prescription and progress were discussed with patient.  Patient was given a copy of prescription and goals.  Patient verbalized understanding. Amanuel plans to continue to exercise by joining [employer].    Dr. Oneil Pinal is Medical Director for [facility] Cardiac Rehabilitation.  Dr. Fuad Aleskerov is Medical Director for [facility] Pulmonary Rehabilitation.

## 2024-03-29 NOTE — Progress Notes (Signed)
 Discharge Summary   Ryan English  Jun 30, 1951   [name] graduated today from  rehab with 36 sessions completed.  Details of the patient's exercise prescription and what He needs to do in order to continue the prescription and progress were discussed with patient.  Patient was given a copy of prescription and goals.  Patient verbalized understanding. [name] plans to continue to exercise by joining [employer].   6 Minute Walk     Row Name 10/19/23 1131 03/13/24 0935       6 Minute Walk   Phase Initial Discharge    Distance 1135 feet 1320 feet    Distance % Change -- 16 %    Distance Feet Change -- 185 ft    Walk Time 6 minutes 6 minutes    # of Rest Breaks 0 0    MPH 2.15 2.5    METS 2.42 2.69    RPE 11 10    Perceived Dyspnea  0 1    VO2 Peak 8.47 9.4    Symptoms Yes (comment) Yes (comment)    Comments knee soreness knee pain- constant    Resting HR 66 bpm 70 bpm    Resting BP 136/70 122/76    Resting Oxygen Saturation  97 % 96 %    Exercise Oxygen Saturation  during 6 min walk 97 % 92 %    Max Ex. HR 95 bpm 103 bpm    Max Ex. BP 144/72 126/64    2 Minute Post BP 128/70 --

## 2024-03-29 NOTE — Progress Notes (Signed)
 Cardiac Individual Treatment Plan  Patient Details  Name: MACCOY HAUBNER MRN: [id number] Date of Birth: July 03, 1951 Referring Provider:   Flowsheet Row Cardiac Rehab from 10/19/2023 in [facility] Cardiac and Pulmonary Rehab  Referring Provider Neita Breeding, MD    Initial Encounter Date:  Flowsheet Row Cardiac Rehab from 10/19/2023 in [facility] Cardiac and Pulmonary Rehab  Date 10/19/23    Visit Diagnosis: Status post coronary artery stent placement  Patient's Home Medications on Admission:  Current Outpatient Medications:    acetaminophen  (TYLENOL ) 500 MG tablet, Take by mouth., Disp: , Rfl:    albuterol  (VENTOLIN  HFA) 108 (90 Base) MCG/ACT inhaler, TAKE 2 PUFFS BY MOUTH EVERY 6 HOURS AS NEEDED FOR WHEEZE OR SHORTNESS OF BREATH, Disp: 18 each, Rfl: 0   amLODipine  (NORVASC ) 2.5 MG tablet, Take 1 tablet (2.5 mg total) by mouth daily., Disp: 30 tablet, Rfl: 11   aspirin  EC 81 MG tablet, Take by mouth., Disp: , Rfl:    atorvastatin (LIPITOR) 40 MG tablet, Take 40 mg by mouth daily., Disp: , Rfl:    Dextromethorphan HBr (DELSYM PO), Take by mouth as needed., Disp: , Rfl:    ezetimibe  (ZETIA ) 10 MG tablet, Take 1 tablet (10 mg total) by mouth daily., Disp: 90 tablet, Rfl: 3   ipratropium (ATROVENT ) 0.03 % nasal spray, Place 2 sprays into both nostrils every 12 (twelve) hours., Disp: 30 mL, Rfl: 12   loratadine  (CLARITIN  REDITABS) 10 MG dissolvable tablet, Dissolve 1 tablet (10 mg total) in the mouth daily., Disp: , Rfl:    losartan  (COZAAR ) 50 MG tablet, TAKE 1 TABLET BY MOUTH ONCE DAILY, Disp: 90 tablet, Rfl: 1   Misc Natural Products (OSTEO BI-FLEX TRIPLE STRENGTH) TABS, Take 2 tablets by mouth daily., Disp: , Rfl:    montelukast  (SINGULAIR ) 10 MG tablet, TAKE 1 TABLET BY MOUTH NIGHTLY, Disp: 90 tablet, Rfl: 1   nitroGLYCERIN  (NITROSTAT ) 0.4 MG SL tablet, Place under the tongue., Disp: , Rfl:    omeprazole  (PRILOSEC) 20 MG capsule, Take 1 capsule (20 mg total) by mouth daily., Disp: 30 capsule, Rfl:  3   prasugrel (EFFIENT) 10 MG TABS tablet, Take 1 tablet by mouth daily., Disp: , Rfl:    Pseudoephedrine-guaiFENesin (MUCINEX D PO), Take by mouth as needed., Disp: , Rfl:   Past Medical History: Past Medical History:  Diagnosis Date   Anginal pain    Arthritis    GERD (gastroesophageal reflux disease)    Hyperlipidemia     Tobacco Use: Social History   Tobacco Use  Smoking Status Never  Smokeless Tobacco Never    Labs: Review Flowsheet  More data exists      Latest Ref Rng & Units 02/01/2020 02/02/2021 02/10/2022 02/17/2023 02/20/2024  Labs for ITP Cardiac and Pulmonary Rehab  Cholestrol 100 - 199 mg/dL 872  858  863  871  869   LDL (calc) 0 - 99 mg/dL 63  69  67  61  59   HDL-C >39 mg/dL 47  52  42  44  52   Trlycerides 0 - 149 mg/dL 90  886  845  867  894   Hemoglobin A1c 4.8 - 5.6 % - 5.7  5.5  5.5  5.6      Exercise Target Goals: Exercise Program Goal: Individual exercise prescription set using results from initial 6 min walk test and THRR while considering  patient's activity barriers and safety.   Exercise Prescription Goal: Initial exercise prescription builds to 30-45 minutes a day of aerobic  activity, 2-3 days per week.  Home exercise guidelines will be given to patient during program as part of exercise prescription that the participant will acknowledge.   Education: Aerobic Exercise: - Group verbal and visual presentation on the components of exercise prescription. Introduces F.I.T.T principle from ACSM for exercise prescriptions.  Reviews F.I.T.T. principles of aerobic exercise including progression. Written material provided at class time. Flowsheet Row Cardiac Rehab from 12/08/2023 in [facility] Cardiac and Pulmonary Rehab  Date 12/01/23  Educator MB  Instruction Review Code 1- Verbalizes Understanding    Education: Resistance Exercise: - Group verbal and visual presentation on the components of exercise prescription. Introduces F.I.T.T principle from ACSM for  exercise prescriptions  Reviews F.I.T.T. principles of resistance exercise including progression. Written material provided at class time.    Education: Exercise & Equipment Safety: - Individual verbal instruction and demonstration of equipment use and safety with use of the equipment. Flowsheet Row Cardiac Rehab from 12/08/2023 in [facility] Cardiac and Pulmonary Rehab  Date 10/12/23  Educator jh  Instruction Review Code 1- Verbalizes Understanding    Education: Exercise Physiology & General Exercise Guidelines: - Group verbal and written instruction with models to review the exercise physiology of the cardiovascular system and associated critical values. Provides general exercise guidelines with specific guidelines to those with heart or lung disease. Written material provided at class time. Flowsheet Row Cardiac Rehab from 12/08/2023 in [facility] Cardiac and Pulmonary Rehab  Date 11/17/23  Educator MB  Instruction Review Code 1- [employer] Understanding    Education: Flexibility, Balance, Mind/Body Relaxation: - Group verbal and visual presentation with interactive activity on the components of exercise prescription. Introduces F.I.T.T principle from ACSM for exercise prescriptions. Reviews F.I.T.T. principles of flexibility and balance exercise training including progression. Also discusses the mind body connection.  Reviews various relaxation techniques to help reduce and manage stress (i.e. Deep breathing, progressive muscle relaxation, and visualization). Balance handout provided to take home. Written material provided at class time.   Activity Barriers & Risk Stratification:  Activity Barriers & Cardiac Risk Stratification - 10/19/23 1135       Activity Barriers & Cardiac Risk Stratification   Activity Barriers Other (comment);Neck/Spine Problems;Arthritis    Comments Right and Left knees need to be replaced.    Cardiac Risk Stratification High          6 Minute Walk:  6 Minute Walk      Row Name 10/19/23 1131 03/13/24 0935       6 Minute Walk   Phase Initial Discharge    Distance 1135 feet 1320 feet    Distance % Change -- 16 %    Distance Feet Change -- 185 ft    Walk Time 6 minutes 6 minutes    # of Rest Breaks 0 0    MPH 2.15 2.5    METS 2.42 2.69    RPE 11 10    Perceived Dyspnea  0 1    VO2 Peak 8.47 9.4    Symptoms Yes (comment) Yes (comment)    Comments knee soreness knee pain- constant    Resting HR 66 bpm 70 bpm    Resting BP 136/70 122/76    Resting Oxygen Saturation  97 % 96 %    Exercise Oxygen Saturation  during 6 min walk 97 % 92 %    Max Ex. HR 95 bpm 103 bpm    Max Ex. BP 144/72 126/64    2 Minute Post BP 128/70 --  Oxygen Initial Assessment:   Oxygen Re-Evaluation:   Oxygen Discharge (Final Oxygen Re-Evaluation):   Initial Exercise Prescription:  Initial Exercise Prescription - 10/19/23 1100       Date of Initial Exercise RX and Referring Provider   Date 10/19/23    Referring Provider Neita Breeding, MD      Oxygen   Maintain Oxygen Saturation 88% or higher      Treadmill   MPH 2.2    Grade 0    Minutes 15    METs 2.68      NuStep   Level 3    SPM 80    Minutes 15    METs 2.42      REL-XR   Level 2    Speed 50    Minutes 15    METs 2.42      Prescription Details   Frequency (times per week) 2    Duration Progress to 30 minutes of continuous aerobic without signs/symptoms of physical distress      Intensity   THRR 40-80% of Max Heartrate 98-130    Ratings of Perceived Exertion 11-13    Perceived Dyspnea 0-4      Progression   Progression Continue to progress workloads to maintain intensity without signs/symptoms of physical distress.      Resistance Training   Training Prescription Yes    Weight 10 lb    Reps 10-15          Perform Capillary Blood Glucose checks as needed.  Exercise Prescription Changes:   Exercise Prescription Changes     Row Name 10/19/23 1100 11/10/23 1400 11/24/23  1700 12/08/23 1600 12/21/23 1400     Response to Exercise   Blood Pressure (Admit) 136/70 98/62 98/60  122/66 128/76   Blood Pressure (Exercise) 144/72 132/60 134/60 144/64 --   Blood Pressure (Exit) 128/70 100/60 102/80 120/64 122/70   Heart Rate (Admit) 66 bpm 81 bpm 75 bpm 99 bpm 91 bpm   Heart Rate (Exercise) 95 bpm 110 bpm 113 bpm 109 bpm 104 bpm   Heart Rate (Exit) 58 bpm 81 bpm 69 bpm 74 bpm 74 bpm   Oxygen Saturation (Admit) 97 % -- -- -- --   Oxygen Saturation (Exercise) 97 % -- -- -- --   Rating of Perceived Exertion (Exercise) 11 13 15 15 14    Perceived Dyspnea (Exercise) 0 -- -- -- --   Symptoms knee soreness none none none none   Comments Results First two weeks of exercise -- -- --   Duration -- Continue with 30 min of aerobic exercise without signs/symptoms of physical distress. Continue with 30 min of aerobic exercise without signs/symptoms of physical distress. Continue with 30 min of aerobic exercise without signs/symptoms of physical distress. Continue with 30 min of aerobic exercise without signs/symptoms of physical distress.   Intensity -- THRR unchanged THRR unchanged THRR unchanged THRR unchanged     Progression   Progression -- Continue to progress workloads to maintain intensity without signs/symptoms of physical distress. Continue to progress workloads to maintain intensity without signs/symptoms of physical distress. Continue to progress workloads to maintain intensity without signs/symptoms of physical distress. Continue to progress workloads to maintain intensity without signs/symptoms of physical distress.   Average METs -- 3.22 4.25 3.55 3.54     Resistance Training   Training Prescription -- Yes Yes Yes Yes   Weight -- 10 lb 10 lb 10 lb 10 lb   Reps -- 10-15 10-15 10-15 10-15  Interval Training   Interval Training -- No No No No     Treadmill   MPH -- 2.5 2.5 2.5 2.5   Grade -- 1 1.5 1 0   Minutes -- 15 15 15 15    METs -- 3.26 3.43 3.26 2.91      NuStep   Level -- 6 -- 7  T6 --   Minutes -- 15 -- 15 --   METs -- 5.5 -- 3.4 --     REL-XR   Level -- 2 4 10 8    Minutes -- 15 15 15 15    METs -- -- 9 6.1 3.8     T5 Nustep   Level -- 5 6 -- --   Minutes -- 15 15 -- --   METs -- 3 3.9 -- --     Oxygen   Maintain Oxygen Saturation -- 88% or higher 88% or higher 88% or higher 88% or higher    Row Name 01/03/24 1500 01/17/24 1500 01/31/24 1300 02/16/24 0800 03/01/24 1600     Response to Exercise   Blood Pressure (Admit) 118/64 118/64 104/62 126/68 108/54   Blood Pressure (Exit) 124/68 102/62 122/66 110/60 94/54   Heart Rate (Admit) 69 bpm 73 bpm 55 bpm 95 bpm 84 bpm   Heart Rate (Exercise) 127 bpm 114 bpm 115 bpm 115 bpm 106 bpm   Heart Rate (Exit) 88 bpm 93 bpm 84 bpm 81 bpm 92 bpm   Rating of Perceived Exertion (Exercise) 16 14 14 14 12    Symptoms none none none none none   Duration Continue with 30 min of aerobic exercise without signs/symptoms of physical distress. Continue with 30 min of aerobic exercise without signs/symptoms of physical distress. Continue with 30 min of aerobic exercise without signs/symptoms of physical distress. Continue with 30 min of aerobic exercise without signs/symptoms of physical distress. Continue with 30 min of aerobic exercise without signs/symptoms of physical distress.   Intensity THRR unchanged THRR unchanged THRR unchanged THRR unchanged THRR unchanged     Progression   Progression Continue to progress workloads to maintain intensity without signs/symptoms of physical distress. Continue to progress workloads to maintain intensity without signs/symptoms of physical distress. Continue to progress workloads to maintain intensity without signs/symptoms of physical distress. Continue to progress workloads to maintain intensity without signs/symptoms of physical distress. Continue to progress workloads to maintain intensity without signs/symptoms of physical distress.   Average METs 4.09 4.87  4.46 4.33 2.91     Resistance Training   Training Prescription Yes Yes Yes Yes Yes   Weight 10 lb 10 lb 10 lb 10 lb 10 lb   Reps 10-15 10-15 10-15 10-15 10-15     Interval Training   Interval Training No No No No No     Treadmill   MPH 2.5 2.8 2.5 2.5 2.5   Grade 3.5 0 4.5 6.5 0   Minutes 15 15 15 15 15    METs 4.12 3.14 4.47 5.15 1.91     NuStep   Level -- 6 -- 5 --   Minutes -- 15 -- 15 --   METs -- 6.6 -- 5.1 --     REL-XR   Level -- -- 7 6 6    Minutes -- -- 15 15 15    METs -- -- 5.3 4.9 --     Biostep-RELP   Level 8 -- -- -- --   Minutes 15 -- -- -- --   METs 5 -- -- -- --  Rower   Level 5 -- -- -- --   Watts 55 -- -- -- --   Minutes 15 -- -- -- --     Oxygen   Maintain Oxygen Saturation 88% or higher 88% or higher 88% or higher 88% or higher 88% or higher    Row Name 03/29/24 0700             Response to Exercise   Blood Pressure (Admit) 118/60       Blood Pressure (Exit) 90/60       Heart Rate (Admit) 83 bpm       Heart Rate (Exercise) 122 bpm       Heart Rate (Exit) 80 bpm       Oxygen Saturation (Admit) 95 %       Oxygen Saturation (Exercise) 91 %       Oxygen Saturation (Exit) 96 %       Rating of Perceived Exertion (Exercise) 15       Perceived Dyspnea (Exercise) 1       Symptoms none       Duration Continue with 30 min of aerobic exercise without signs/symptoms of physical distress.       Intensity THRR unchanged         Progression   Progression Continue to progress workloads to maintain intensity without signs/symptoms of physical distress.       Average METs 4.38         Resistance Training   Training Prescription Yes       Weight 10 lb       Reps 10-15         Interval Training   Interval Training No         Treadmill   MPH 2.5       Grade 3.5       Minutes 15       METs 4.12         Rower   Level 7       Watts 58       Minutes 15       METs 5.63         Oxygen   Maintain Oxygen Saturation 88% or higher           Exercise Comments:   Exercise Comments     Row Name 10/24/23 0805           Exercise Comments First full day of exercise!  Patient was oriented to gym and equipment including functions, settings, policies, and procedures.  Patient's individual exercise prescription and treatment plan were reviewed.  All starting workloads were established based on the results of the 6 minute walk test done at initial orientation visit.  The plan for exercise progression was also introduced and progression will be customized based on patient's performance and goals.          Exercise Goals and Review:   Exercise Goals     Row Name 10/19/23 1135             Exercise Goals   Increase Physical Activity Yes       Intervention Provide advice, education, support and counseling about physical activity/exercise needs.;Develop an individualized exercise prescription for aerobic and resistive training based on initial evaluation findings, risk stratification, comorbidities and participant's personal goals.       Expected Outcomes Short Term: Attend rehab on a regular basis to increase amount of physical activity.;Long Term: Add in home exercise to  make exercise part of routine and to increase amount of physical activity.;Long Term: Exercising regularly at least 3-5 days a week.       Increase Strength and Stamina Yes       Intervention Provide advice, education, support and counseling about physical activity/exercise needs.;Develop an individualized exercise prescription for aerobic and resistive training based on initial evaluation findings, risk stratification, comorbidities and participant's personal goals.       Expected Outcomes Short Term: Increase workloads from initial exercise prescription for resistance, speed, and METs.;Short Term: Perform resistance training exercises routinely during rehab and add in resistance training at home;Long Term: Improve cardiorespiratory fitness, muscular endurance and  strength as measured by increased METs and functional capacity ( )       Able to understand and use rate of perceived exertion (RPE) scale Yes       Intervention Provide education and explanation on how to use RPE scale       Expected Outcomes Short Term: Able to use RPE daily in rehab to express subjective intensity level;Long Term:  Able to use RPE to guide intensity level when exercising independently       Able to understand and use Dyspnea scale Yes       Intervention Provide education and explanation on how to use Dyspnea scale       Expected Outcomes Short Term: Able to use Dyspnea scale daily in rehab to express subjective sense of shortness of breath during exertion;Long Term: Able to use Dyspnea scale to guide intensity level when exercising independently       Knowledge and understanding of Target Heart Rate Range (THRR) Yes       Intervention Provide education and explanation of THRR including how the numbers were predicted and where they are located for reference       Expected Outcomes Long Term: Able to use THRR to govern intensity when exercising independently;Short Term: Able to state/look up THRR;Short Term: Able to use daily as guideline for intensity in rehab       Able to check pulse independently Yes       Intervention Provide education and demonstration on how to check pulse in carotid and radial arteries.;Review the importance of being able to check your own pulse for safety during independent exercise       Expected Outcomes Short Term: Able to explain why pulse checking is important during independent exercise;Long Term: Able to check pulse independently and accurately       Understanding of Exercise Prescription Yes       Intervention Provide education, explanation, and written materials on patient's individual exercise prescription       Expected Outcomes Short Term: Able to explain program exercise prescription;Long Term: Able to explain home exercise prescription to  exercise independently          Exercise Goals Re-Evaluation :  Exercise Goals Re-Evaluation     Row Name 10/24/23 0805 11/10/23 1434 11/24/23 1759 12/08/23 1627 12/12/23 0751     Exercise Goal Re-Evaluation   Exercise Goals Review Able to understand and use rate of perceived exertion (RPE) scale;Knowledge and understanding of Target Heart Rate Range (THRR);Understanding of Exercise Prescription;Able to understand and use Dyspnea scale Increase Strength and Stamina;Increase Physical Activity;Understanding of Exercise Prescription Increase Strength and Stamina;Increase Physical Activity;Understanding of Exercise Prescription Increase Strength and Stamina;Increase Physical Activity;Understanding of Exercise Prescription Increase Physical Activity;Increase Strength and Stamina;Understanding of Exercise Prescription   Comments Reviewed RPE and dyspnea scale, THR and program prescription with  pt today.  Pt voiced understanding and was given a copy of goals to take home. Jerilynn is off to a good start in the program. He did well on the treadmill at a speed of 2.5 mph with an incline of 1%. He also improved to level 6 on the T4 nustep and level 5 on the T5 nustep. We will continue to monitor his progress in the program. Jerilynn is doing well in rehab. He increased his workload on the treadmill to a speed of 2.5 mph and incline of 1.5%. He also increased to level 4 on the XR and level 6 on the T5 nustep. We will continue to monitor his progress in the program. Jerilynn continues to do well in rehab. He continues to work on the treadmill at a speed of 2.5 mph with an incline of 1%. He also improved to level 10 on the XR and level 7 on the T6 nustep. We will continue to monitor his progress in the program. Lawerence is doing well in rehab. He is walking some at home, most of his activity is yard work.   Expected Outcomes Short: Use RPE daily to regulate intensity. Long: Follow program prescription in THR. Short:  Continue to follow current exercise prescription. Long: Continue exercise to improve strength and stamina. Short: Continue to progressively increase treadmill and XR workloads. Long: Continue exercise to improve strength and stamina. Short: Continue to progressively increase workloads. Long: Continue exercise to improve strength and stamina. STG: Continue to attended rehab and increase workloads as able.    Row Name 12/21/23 1437 01/03/24 1514 01/17/24 1522 01/31/24 1339 02/16/24 0807     Exercise Goal Re-Evaluation   Exercise Goals Review Increase Physical Activity;Increase Strength and Stamina;Understanding of Exercise Prescription Increase Physical Activity;Increase Strength and Stamina;Understanding of Exercise Prescription Increase Physical Activity;Increase Strength and Stamina;Understanding of Exercise Prescription Increase Physical Activity;Increase Strength and Stamina;Understanding of Exercise Prescription Increase Physical Activity;Increase Strength and Stamina;Understanding of Exercise Prescription   Comments Jerilynn continues to do well in rehab. He was able to maintain his workload on the treadmill at a speed of 2.10mph. He was also able to use the XR at level 8. We will continue to monitor his progress in the program. Jerilynn has only attended two sessions since the last review. He was able to increase his treadmill workload to a speed of 2.5 mph with an incline of 3.5%. He also began using the biostep at level 8 and the rower at level 5. We will continue to monitor his progress in the program. Jerilynn is doing well in rehab. He was only able to attend one session during this review period. During his session he was able to increase his speed on the treadmill from 2.5 to 2.94mph. We will continue to monitor his progress in the program. Jerilynn is doing well in rehab but has only attended two sessions. He did increase his workload on the treadmill to a speed of 2.5 mph with an incline of 4.5%.  He also continues to work at level 7 on the XR. We will continue to monitor his progress in the program. Jerilynn is doing well in rehab. He is due for his post soon and hopes to improve. He increased his workload on the treadmill to a speed of 2.5 mph with 6.5% incline. He worked at level 6 on the XR and level 5 on the T4 nustep.. We will continue to monitor his progress in the program.   Expected Outcomes Short: Try to increase  treadmill incline to 2%. Long: Continue exercise to improve strength and stamina. Short: Attend rehab more consistently. Long: Continue exercise to improve strength and stamina. Short: Continue to increase treadmill workload, attend rehab more regularly. Long: Continue exercise to improve strength and stamina. Short: Attend rehab more regularly. Long: Continue exercise to improve strength and stamina. Short: Improve on post . Long: Continue to increase overall METs and stamina.    Row Name 03/01/24 1613 03/15/24 0757 03/27/24 1123 03/29/24 0753       Exercise Goal Re-Evaluation   Exercise Goals Review Increase Physical Activity;Increase Strength and Stamina;Understanding of Exercise Prescription Increase Physical Activity;Increase Strength and Stamina;Understanding of Exercise Prescription Increase Physical Activity;Increase Strength and Stamina;Understanding of Exercise Prescription Increase Physical Activity;Increase Strength and Stamina;Understanding of Exercise Prescription    Comments Jerilynn has only attended one session since the last review. He is due for his post soon and will look to improve on it. We will continue to monitor his progress in the program. Jerilynn did not attend rehab during the last review period. He did return for one session of rehab on 03/13/2024. During this session he was able to complete his post andimproved by 16%. We will continue to monitor his progress in the program. Jerilynn is doign well at [employer], he is feeling much better  since starting the program, plans to join gym after finishing rehab Jerilynn continues to do well in rehab and will graduate soon. He improved by 16% on his post with 1367ft. He increased his treadmill workload to a speed of 2. and incline of 3.5%. He also increased to level 7 on rower with an average of 58 watts. We will continue to monitor his progress in the program until graduation.    Expected Outcomes Short: Return to rehab and improve on post . Long: Continue to increase overall METs and stamina. Short: Attend rehab consistently until graduating from the program. Long: Continue to exercise independently. STG: join gym for exercise plan after rehab. LTG: Continue to exercise independently. Short: Graduate. Long: Continue to exercise independently.       Discharge Exercise Prescription (Final Exercise Prescription Changes):  Exercise Prescription Changes - 03/29/24 0700       Response to Exercise   Blood Pressure (Admit) 118/60    Blood Pressure (Exit) 90/60    Heart Rate (Admit) 83 bpm    Heart Rate (Exercise) 122 bpm    Heart Rate (Exit) 80 bpm    Oxygen Saturation (Admit) 95 %    Oxygen Saturation (Exercise) 91 %    Oxygen Saturation (Exit) 96 %    Rating of Perceived Exertion (Exercise) 15    Perceived Dyspnea (Exercise) 1    Symptoms none    Duration Continue with 30 min of aerobic exercise without signs/symptoms of physical distress.    Intensity THRR unchanged      Progression   Progression Continue to progress workloads to maintain intensity without signs/symptoms of physical distress.    Average METs 4.38      Resistance Training   Training Prescription Yes    Weight 10 lb    Reps 10-15      Interval Training   Interval Training No      Treadmill   MPH 2.5    Grade 3.5    Minutes 15    METs 4.12      Rower   Level 7    Watts 58    Minutes 15    METs 5.63  Oxygen   Maintain Oxygen Saturation 88% or higher          Nutrition:   Target Goals: Understanding of nutrition guidelines, daily intake of sodium 1500mg , cholesterol 200mg , calories 30% from fat and 7% or less from saturated fats, daily to have 5 or more servings of fruits and vegetables.  Education: Nutrition 1 -Group instruction provided by verbal, written material, interactive activities, discussions, models, and posters to present general guidelines for heart healthy nutrition including macronutrients, label reading, and promoting whole foods over processed counterparts. Education serves as Pensions consultant of discussion of heart healthy eating for all. Written material provided at class time.    Education: Nutrition 2 -Group instruction provided by verbal, written material, interactive activities, discussions, models, and posters to present general guidelines for heart healthy nutrition including sodium, cholesterol, and saturated fat. Providing guidance of habit forming to improve blood pressure, cholesterol, and body weight. Written material provided at class time.     Biometrics:  Pre Biometrics - 10/19/23 1136       Pre Biometrics   Height 5' 10.5 (1.791 m)    Weight 208 lb 4.8 oz (94.5 kg)    Waist Circumference 39 inches    Hip Circumference 40 inches    Waist to Hip Ratio 0.98 %    BMI (Calculated) 29.46    Single Leg Stand 6.55 seconds          Post Biometrics - 03/13/24 9061        Post  Biometrics   Height 5' 10.5 (1.791 m)    Weight 208 lb 4.8 oz (94.5 kg)    Waist Circumference 40.5 inches    Hip Circumference 39 inches    Waist to Hip Ratio 1.04 %    BMI (Calculated) 29.46    Single Leg Stand 14.75 seconds          Nutrition Therapy Plan and Nutrition Goals:  Nutrition Therapy & Goals - 10/19/23 1130       Nutrition Therapy   RD appointment deferred Yes      Intervention Plan   Intervention Prescribe, educate and counsel regarding individualized specific dietary modifications aiming towards targeted core components  such as weight, hypertension, lipid management, diabetes, heart failure and other comorbidities.    Expected Outcomes Short Term Goal: Understand basic principles of dietary content, such as calories, fat, sodium, cholesterol and nutrients.;Short Term Goal: A plan has been developed with personal nutrition goals set during dietitian appointment.;Long Term Goal: Adherence to prescribed nutrition plan.          Nutrition Assessments:  MEDIFICTS Score Key: >=70 Need to make dietary changes  40-70 Heart Healthy Diet <= 40 Therapeutic Level Cholesterol Diet  Flowsheet Row Cardiac Rehab from 03/15/2024 in [facility] Cardiac and Pulmonary Rehab  Picture Your Plate Total Score on Discharge 64   Picture Your Plate Scores: <59 Unhealthy dietary pattern with much room for improvement. 41-50 Dietary pattern unlikely to meet recommendations for good health and room for improvement. 51-60 More healthful dietary pattern, with some room for improvement.  >60 Healthy dietary pattern, although there may be some specific behaviors that could be improved.    Nutrition Goals Re-Evaluation:  Nutrition Goals Re-Evaluation     Row Name 12/12/23 0753             Goals   Comment Deferred RD appointment          Nutrition Goals Discharge (Final Nutrition Goals Re-Evaluation):  Nutrition Goals Re-Evaluation - 12/12/23 9246  Goals   Comment Deferred RD appointment          Psychosocial: Target Goals: Acknowledge presence or absence of significant depression and/or stress, maximize coping skills, provide positive support system. Participant is able to verbalize types and ability to use techniques and skills needed for reducing stress and depression.   Education: Stress, Anxiety, and Depression - Group verbal and visual presentation to define topics covered.  Reviews how body is impacted by stress, anxiety, and depression.  Also discusses healthy ways to reduce stress and to treat/manage anxiety  and depression. Written material provided at class time. Flowsheet Row Cardiac Rehab from 12/08/2023 in [facility] Cardiac and Pulmonary Rehab  Date 11/10/23  Educator SB  Instruction Review Code 1- [employer] Understanding    Education: Sleep Hygiene -Provides group verbal and written instruction about how sleep can affect your health.  Define sleep hygiene, discuss sleep cycles and impact of sleep habits. Review good sleep hygiene tips.   Initial Review & Psychosocial Screening:  Initial Psych Review & Screening - 10/12/23 1426       Initial Review   Current issues with None Identified      Family Dynamics   Good Support System? Yes    Comments He can look to his wife and two sons for support. He states no issues with his mental state and does not take any medication for his mood.      Barriers   Psychosocial barriers to participate in program The patient should benefit from training in stress management and relaxation.;There are no identifiable barriers or psychosocial needs.      Screening Interventions   Interventions Provide feedback about the scores to participant;Program counselor consult;To provide support and resources with identified psychosocial needs    Expected Outcomes Short Term goal: Utilizing psychosocial counselor, staff and physician to assist with identification of specific Stressors or current issues interfering with healing process. Setting desired goal for each stressor or current issue identified.;Long Term Goal: Stressors or current issues are controlled or eliminated.;Short Term goal: Identification and review with participant of any Quality of Life or Depression concerns found by scoring the questionnaire.;Long Term goal: The participant improves quality of Life and PHQ9 Scores as seen by post scores and/or verbalization of changes          Quality of Life Scores:   Quality of Life - 03/15/24 1409       Quality of Life   Select Quality of Life      Quality  of Life Scores   Health/Function Post 22.47 %    Socioeconomic Post 22.21 %    Psych/Spiritual Post 22.5 %    Family Post 24.9 %    GLOBAL Post 22.78 %         Scores of 19 and below usually indicate a poorer quality of life in these areas.  A difference of  2-3 points is a clinically meaningful difference.  A difference of 2-3 points in the total score of the Quality of Life Index has been associated with significant improvement in overall quality of life, self-image, physical symptoms, and general health in studies assessing change in quality of life.  PHQ-9: Review Flowsheet  More data exists      03/15/2024 02/20/2024 01/24/2024 10/19/2023 08/18/2023  Depression screen PHQ 2/9  Decreased Interest 0 0 0 0 0  Down, Depressed, Hopeless 0 0 0 0 0  PHQ - 2 Score 0 0 0 0 0  Altered sleeping 0 0 0 0 -  Tired, decreased energy 0 0 0 0 -  Change in appetite 0 0 0 0 -  Feeling bad or failure about yourself  0 0 0 0 -  Trouble concentrating 0 0 0 0 -  Moving slowly or fidgety/restless 0 0 0 0 -  Suicidal thoughts 0 0 0 0 -  PHQ-9 Score 0 0 0 0 -  Difficult doing work/chores Not difficult at all Not difficult at all Not difficult at all - -   Interpretation of Total Score  Total Score Depression Severity:  1-4 = Minimal depression, 5-9 = Mild depression, 10-14 = Moderate depression, 15-19 = Moderately severe depression, 20-27 = Severe depression   Psychosocial Evaluation and Intervention:  Psychosocial Evaluation - 10/12/23 1427       Psychosocial Evaluation & Interventions   Interventions Encouraged to exercise with the program and follow exercise prescription;Relaxation education;Stress management education    Comments He can look to his wife and two sons for support. He states no issues with his mental state and does not take any medication for his mood.    Expected Outcomes Short: Start [facility] to help with mood. Long: Maintain a healthy mental state    Continue Psychosocial  Services  Follow up required by staff          Psychosocial Re-Evaluation:  Psychosocial Re-Evaluation     Row Name 12/12/23 0752 03/27/24 1125           Psychosocial Re-Evaluation   Current issues with None Identified None Identified      Comments Jerilynn reports he is sleeping well, 6-8hr each night. Denies any stress, anxiety, or depression. Lawerence denies any anxiety, depression or stress at this time.He reports he is sleeping well      Expected Outcomes STG: Continue to attended rehab. LTG: Achieve and maintain a positive outlook on health and daily life STG: Graduate LTG:  Achieve and maintain a positive outlook on health and daily life      Interventions Encouraged to attend Cardiac Rehabilitation for the exercise Encouraged to attend Cardiac Rehabilitation for the exercise      Continue Psychosocial Services  Follow up required by staff Follow up required by staff         Psychosocial Discharge (Final Psychosocial Re-Evaluation):  Psychosocial Re-Evaluation - 03/27/24 1125       Psychosocial Re-Evaluation   Current issues with None Identified    Comments Lawerence denies any anxiety, depression or stress at this time.He reports he is sleeping well    Expected Outcomes STG: Graduate LTG:  Achieve and maintain a positive outlook on health and daily life    Interventions Encouraged to attend Cardiac Rehabilitation for the exercise    Continue Psychosocial Services  Follow up required by staff          Vocational Rehabilitation: Provide vocational rehab assistance to qualifying candidates.   Vocational Rehab Evaluation & Intervention:   Education: Education Goals: Education classes will be provided on a variety of topics geared toward better understanding of heart health and risk factor modification. Participant will state understanding/return demonstration of topics presented as noted by education test scores.  Learning Barriers/Preferences:  Learning  Barriers/Preferences - 10/12/23 1426       Learning Barriers/Preferences   Learning Barriers None    Learning Preferences None          General Cardiac Education Topics:  AED/CPR: - Group verbal and written instruction with the use of models to demonstrate the basic use of  the AED with the basic ABC's of resuscitation.   Test and Procedures: - Group verbal and visual presentation and models provide information about basic cardiac anatomy and function. Reviews the testing methods done to diagnose heart disease and the outcomes of the test results. Describes the treatment choices: Medical Management, Angioplasty, or Coronary Bypass Surgery for treating various heart conditions including Myocardial Infarction, Angina, Valve Disease, and Cardiac Arrhythmias. Written material provided at class time.   Medication Safety: - Group verbal and visual instruction to review commonly prescribed medications for heart and lung disease. Reviews the medication, class of the drug, and side effects. Includes the steps to properly store meds and maintain the prescription regimen. Written material provided at class time.   Intimacy: - Group verbal instruction through game format to discuss how heart and lung disease can affect sexual intimacy. Written material provided at class time. Flowsheet Row Cardiac Rehab from 12/08/2023 in [facility] Cardiac and Pulmonary Rehab  Date 12/01/23  Educator MB  Instruction Review Code 1- Verbalizes Understanding    Know Your Numbers and Heart Failure: - Group verbal and visual instruction to discuss disease risk factors for cardiac and pulmonary disease and treatment options.  Reviews associated critical values for Overweight/Obesity, Hypertension, Cholesterol, and Diabetes.  Discusses basics of heart failure: signs/symptoms and treatments.  Introduces Heart Failure Zone chart for action plan for heart failure. Written material provided at class time.   Infection  Prevention: - Provides verbal and written material to individual with discussion of infection control including proper hand washing and proper equipment cleaning during exercise session. Flowsheet Row Cardiac Rehab from 12/08/2023 in [facility] Cardiac and Pulmonary Rehab  Date 10/12/23  Educator jh  Instruction Review Code 1- Verbalizes Understanding    Falls Prevention: - Provides verbal and written material to individual with discussion of falls prevention and safety. Flowsheet Row Cardiac Rehab from 12/08/2023 in [facility] Cardiac and Pulmonary Rehab  Date 10/12/23  Educator jh  Instruction Review Code 1- Verbalizes Understanding    Other: -Provides group and verbal instruction on various topics (see comments)   Knowledge Questionnaire Score:  Knowledge Questionnaire Score - 03/15/24 1407       Knowledge Questionnaire Score   Post Score 22/26          Core Components/Risk Factors/Patient Goals at Admission:  Personal Goals and Risk Factors at Admission - 10/12/23 1425       Core Components/Risk Factors/Patient Goals on Admission    Weight Management Yes;Weight Maintenance;Weight Loss    Intervention Weight Management: Develop a combined nutrition and exercise program designed to reach desired caloric intake, while maintaining appropriate intake of nutrient and fiber, sodium and fats, and appropriate energy expenditure required for the weight goal.;Weight Management: Provide education and appropriate resources to help participant work on and attain dietary goals.;Weight Management/Obesity: Establish reasonable short term and long term weight goals.    Expected Outcomes Short Term: Continue to assess and modify interventions until short term weight is achieved;Weight Maintenance: Understanding of the daily nutrition guidelines, which includes 25-35% calories from fat, 7% or less cal from saturated fats, less than 200mg  cholesterol, less than 1.5gm of sodium, & 5 or more servings of fruits  and vegetables daily;Understanding recommendations for meals to include 15-35% energy as protein, 25-35% energy from fat, 35-60% energy from carbohydrates, less than 200mg  of dietary cholesterol, 20-35 gm of total fiber daily;Weight Loss: Understanding of general recommendations for a balanced deficit meal plan, which promotes 1-2 lb weight loss per week and includes  a negative energy balance of [phone] kcal/d;Understanding of distribution of calorie intake throughout the day with the consumption of 4-5 meals/snacks    Heart Failure Yes    Intervention Provide a combined exercise and nutrition program that is supplemented with education, support and counseling about heart failure. Directed toward relieving symptoms such as shortness of breath, decreased exercise tolerance, and extremity edema.    Expected Outcomes Improve functional capacity of life;Short term: Attendance in program 2-3 days a week with increased exercise capacity. Reported lower sodium intake. Reported increased fruit and vegetable intake. Reports medication compliance.;Short term: Daily weights obtained and reported for increase. Utilizing diuretic protocols set by physician.;Long term: Adoption of self-care skills and reduction of barriers for early signs and symptoms recognition and intervention leading to self-care maintenance.    Hypertension Yes    Intervention Provide education on lifestyle modifcations including regular physical activity/exercise, weight management, moderate sodium restriction and increased consumption of fresh fruit, vegetables, and low fat dairy, alcohol moderation, and smoking cessation.;Monitor prescription use compliance.    Expected Outcomes Short Term: Continued assessment and intervention until BP is < 140/73mm HG in hypertensive participants. < 130/63mm HG in hypertensive participants with diabetes, heart failure or chronic kidney disease.;Long Term: Maintenance of blood pressure at goal levels.    Lipids  Yes    Intervention Provide education and support for participant on nutrition & aerobic/resistive exercise along with prescribed medications to achieve LDL 70mg , HDL >40mg .    Expected Outcomes Short Term: Participant states understanding of desired cholesterol values and is compliant with medications prescribed. Participant is following exercise prescription and nutrition guidelines.;Long Term: Cholesterol controlled with medications as prescribed, with individualized exercise RX and with personalized nutrition plan. Value goals: LDL < 70mg , HDL > 40 mg.          Education:Diabetes - Individual verbal and written instruction to review signs/symptoms of diabetes, desired ranges of glucose level fasting, after meals and with exercise. Acknowledge that pre and post exercise glucose checks will be done for 3 sessions at entry of program.   Core Components/Risk Factors/Patient Goals Review:   Goals and Risk Factor Review     Row Name 12/12/23 0753 03/27/24 1127           Core Components/Risk Factors/Patient Goals Review   Personal Goals Review Hypertension Hypertension      Review Jerilynn has a BP cuff at home and checks his BP daily, reports it is consistent and matches readings taken here at rehab. Lawerence reports he checks his BP at home and takes his meds as prescribed      Expected Outcomes STG: Continue to check BP at home. LTG: Manage risk factors independently STG: Continue to check BP at home. LTG: Manage risk factors independently         Core Components/Risk Factors/Patient Goals at Discharge (Final Review):   Goals and Risk Factor Review - 03/27/24 1127       Core Components/Risk Factors/Patient Goals Review   Personal Goals Review Hypertension    Review Lawerence reports he checks his BP at home and takes his meds as prescribed    Expected Outcomes STG: Continue to check BP at home. LTG: Manage risk factors independently          ITP Comments:  ITP Comments      Row Name 10/12/23 1425 10/19/23 1129 10/24/23 0804 11/09/23 1311 12/07/23 1107   ITP Comments Virtual Visit completed. Patient informed on EP and RD appointment and 6 Minute walk test.  Patient also informed of patient health questionnaires on My Chart. Patient Verbalizes understanding. Visit diagnosis can be found in [facility] 09/23/2023. Patient is to bring in medication list. Completed and gym orientation for cardiac rehab. Initial ITP created and sent for review to Dr. Oneil Pinal, Medical Director. First full day of exercise!  Patient was oriented to gym and equipment including functions, settings, policies, and procedures.  Patient's individual exercise prescription and treatment plan were reviewed.  All starting workloads were established based on the results of the 6 minute walk test done at initial orientation visit.  The plan for exercise progression was also introduced and progression will be customized based on patient's performance and goals. 30 Day review completed. Medical Director ITP review done, changes made as directed, and signed approval by Medical Director. New to program. 30 Day review completed. Medical Director ITP review done, changes made as directed, and signed approval by Medical Director.    Row Name 01/04/24 1115 02/01/24 1013 02/29/24 1049 03/28/24 1122 03/29/24 1122   ITP Comments 30 Day review completed. Medical Director ITP review done, changes made as directed, and signed approval by Medical Director. 30 Day review completed. Medical Director ITP review done, changes made as directed, and signed approval by Medical Director. 30 Day review completed. Medical Director ITP review done, changes made as directed, and signed approval by Medical Director. 30 Day review completed. Medical Director ITP review done, changes made as directed, and signed approval by Medical Director. Primo graduated today from  rehab with 36 sessions completed.  Details of the patient's exercise  prescription and what He needs to do in order to continue the prescription and progress were discussed with patient.  Patient was given a copy of prescription and goals.  Patient verbalized understanding. Pauline plans to continue to exercise by joining [employer].      Comments: Discharge ITP

## 2024-04-12 DIAGNOSIS — I10 Essential (primary) hypertension: Secondary | ICD-10-CM | POA: Diagnosis not present

## 2024-04-12 DIAGNOSIS — I2510 Atherosclerotic heart disease of native coronary artery without angina pectoris: Secondary | ICD-10-CM | POA: Diagnosis not present

## 2024-04-12 DIAGNOSIS — I739 Peripheral vascular disease, unspecified: Secondary | ICD-10-CM | POA: Diagnosis not present

## 2024-04-18 ENCOUNTER — Ambulatory Visit: Payer: Self-pay

## 2024-04-18 NOTE — Telephone Encounter (Signed)
 Noted

## 2024-04-18 NOTE — Telephone Encounter (Signed)
 Patient has an appointment with you tomorrow.....FYI

## 2024-04-18 NOTE — Telephone Encounter (Signed)
 FYI Only or Action Required?: FYI only for provider.  Patient was last seen in primary care on 02/20/2024 by [name], MD.  Called Nurse Triage reporting Eye Problem.  Symptoms began yesterday.  Interventions attempted: Nothing.  Symptoms are: stable .  Triage Disposition: See Physician Within 24 Hours  Patient/caregiver understands and will follow disposition?: YesCopied from CRM [id number]. Topic: Clinical - Red Word Triage >> Apr 18, 2024 11:08 AM Ryan English wrote: [name] that prompted transfer to Nurse Triage: Patient states for the last 2 days has had red splotches on his right eye along with some discomfort. Reason for Disposition  [1] Bleeding on white of the eye AND [2] taking Coumadin (warfarin) or other strong blood thinner, or known bleeding disorder (e.g., thrombocytopenia)  Answer Assessment - Initial Assessment Questions 2 days ago patient developed red splotches in the his right eye and he is on blood thinners. Whole eye is not red, still with some white to the sclera. Denies vision changes, just feels irritable. Reached out to Cardiology recommended FU with PCP prior to appt with them.  Appt made with MD 10/16 to evaluate- ED/UC precautions given and understood.   1. LOCATION: Location: What's red, the eyeball or the outer eyelids? (Note: when callers say the eye is red, they usually mean the sclera is red)       Right eye with splotches of blood in the sclera 2. REDNESS OF SCLERA: Is the redness in one or both eyes? When did the redness start?      Splotches of red 3. ONSET: When did the eye become red? (e.g., hours, days)      2 days of this 4. EYELIDS: Are the eyelids red or swollen? If Yes, ask: How much?      Denies issues 5. VISION: Is there any difficulty seeing clearly?      Denies any changes 6. ITCHING: Does it feel itchy? If so ask: How bad is it (e.g., Scale 1-10; or mild, moderate, severe)     Uncomfortable- like something  is irritating it 7. PAIN: Is there any pain? If Yes, ask: How bad is it? (e.g., Scale 1-10; or mild, moderate, severe)     denies 8. CONTACT LENS: Do you wear contacts?     denies 9. CAUSE: What do you think is causing the redness?     Unsure  10. OTHER SYMPTOMS: Do you have any other symptoms? (e.g., fever, runny nose, cough, vomiting)       denies  Protocols used: Eye - Red Without Pus-A-AH

## 2024-04-19 ENCOUNTER — Ambulatory Visit: Admitting: Family Medicine

## 2024-04-19 ENCOUNTER — Encounter: Payer: Self-pay | Admitting: Family Medicine

## 2024-04-19 VITALS — BP 119/80 | HR 65 | Temp 97.7°F | Ht 70.0 in | Wt 206.3 lb

## 2024-04-19 DIAGNOSIS — H1131 Conjunctival hemorrhage, right eye: Secondary | ICD-10-CM | POA: Diagnosis not present

## 2024-04-19 DIAGNOSIS — Z7185 Encounter for immunization safety counseling: Secondary | ICD-10-CM

## 2024-04-19 NOTE — Progress Notes (Signed)
      Acute visit   [name]   DOB: 1950-09-13   73 y.o. Male  MRN: [id number] PCP: [name], MD   Chief Complaint  Patient presents with   Conjunctivitis    Redness in right eye x 4 days and some discomfort per nurse triage note. NO watering/ discharge or vision changes    Subjective    Discussed the use of AI scribe software for clinical note transcription with the patient, who gave verbal consent to proceed.  History of Present Illness   Ryan English is a 73 year old male on blood thinners who presents with redness in the right eye.  Redness in the right eye began three to four days ago, localized to the sclera, and is described as uncomfortable with a sensation similar to 'dust in the eye,' but not painful. There is no itching, discharge, or vision changes. He is on blood thinners and was in a dusty environment while mowing the yard last Friday and Saturday. There is no history of similar symptoms, conjunctivitis, or other eye conditions. He denies recent trauma or specific incidents causing the redness. No vision changes or pain with eye movement are present.        Review of Systems  Objective    BP 119/80 (BP Location: Left Arm, Patient Position: Sitting, Cuff Size: Normal)   Pulse 65   Temp 97.7 F (36.5 C) (Oral)   Ht 5' 10 (1.778 m)   Wt 206 lb 4.8 oz (93.6 kg)   SpO2 97%   BMI 29.60 kg/m   Physical Exam  Erythema of R eye medial conjunctiva, no drainage. EOMI, PERRL  No results found for any visits on 04/19/24.  Assessment & Plan     Problem List Items Addressed This Visit   None Visit Diagnoses       Subconjunctival hemorrhage of right eye    -  Primary           Subconjunctival hemorrhage, right eye Subconjunctival hemorrhage in the right eye, present for 3-4 days, likely related to blood thinner use. No associated pain, itching, discharge, or vision changes. No extension into other chambers of the eye  or vision impairment, indicating a benign course. Expected to resolve spontaneously without intervention. - Continue current blood thinner regimen. - Monitor for any vision changes or extension of hemorrhage into the colored part of the eye. - Seek ophthalmology consultation if symptoms worsen or vision changes occur.  General Health Maintenance Discussion on vaccinations including flu, COVID, and RSV. Advised to receive vaccinations as he is in season and important for health maintenance. Discussed the option of receiving all three vaccinations together or spacing them out based on personal preference and previous experiences with side effects.         No orders of the defined types were placed in this encounter.    Return if symptoms worsen or fail to improve.      Jon Myrla, MD  [facility] [phone] (phone) [phone] (fax)  [facility]

## 2024-04-20 DIAGNOSIS — M79605 Pain in left leg: Secondary | ICD-10-CM | POA: Diagnosis not present

## 2024-04-20 DIAGNOSIS — I2510 Atherosclerotic heart disease of native coronary artery without angina pectoris: Secondary | ICD-10-CM | POA: Diagnosis not present

## 2024-04-20 DIAGNOSIS — I739 Peripheral vascular disease, unspecified: Secondary | ICD-10-CM | POA: Diagnosis not present

## 2024-04-20 DIAGNOSIS — M79604 Pain in right leg: Secondary | ICD-10-CM | POA: Diagnosis not present

## 2024-05-21 DIAGNOSIS — M170 Bilateral primary osteoarthritis of knee: Secondary | ICD-10-CM | POA: Diagnosis not present

## 2024-05-29 ENCOUNTER — Other Ambulatory Visit: Payer: Self-pay | Admitting: Cardiovascular Disease

## 2024-06-02 ENCOUNTER — Telehealth: Admitting: Nurse Practitioner

## 2024-06-02 DIAGNOSIS — Z79899 Other long term (current) drug therapy: Secondary | ICD-10-CM | POA: Diagnosis not present

## 2024-06-02 DIAGNOSIS — R0602 Shortness of breath: Secondary | ICD-10-CM | POA: Diagnosis not present

## 2024-06-02 DIAGNOSIS — A084 Viral intestinal infection, unspecified: Secondary | ICD-10-CM

## 2024-06-02 DIAGNOSIS — Z7902 Long term (current) use of antithrombotics/antiplatelets: Secondary | ICD-10-CM | POA: Diagnosis not present

## 2024-06-02 DIAGNOSIS — E785 Hyperlipidemia, unspecified: Secondary | ICD-10-CM | POA: Diagnosis not present

## 2024-06-02 DIAGNOSIS — R1110 Vomiting, unspecified: Secondary | ICD-10-CM | POA: Diagnosis not present

## 2024-06-02 DIAGNOSIS — E278 Other specified disorders of adrenal gland: Secondary | ICD-10-CM | POA: Diagnosis not present

## 2024-06-02 DIAGNOSIS — K8020 Calculus of gallbladder without cholecystitis without obstruction: Secondary | ICD-10-CM | POA: Diagnosis not present

## 2024-06-02 DIAGNOSIS — I10 Essential (primary) hypertension: Secondary | ICD-10-CM | POA: Diagnosis not present

## 2024-06-02 DIAGNOSIS — I2510 Atherosclerotic heart disease of native coronary artery without angina pectoris: Secondary | ICD-10-CM | POA: Diagnosis not present

## 2024-06-02 DIAGNOSIS — R197 Diarrhea, unspecified: Secondary | ICD-10-CM | POA: Diagnosis not present

## 2024-06-02 DIAGNOSIS — Z1152 Encounter for screening for COVID-19: Secondary | ICD-10-CM | POA: Diagnosis not present

## 2024-06-02 DIAGNOSIS — E279 Disorder of adrenal gland, unspecified: Secondary | ICD-10-CM | POA: Diagnosis not present

## 2024-06-02 DIAGNOSIS — R1084 Generalized abdominal pain: Secondary | ICD-10-CM | POA: Diagnosis not present

## 2024-06-02 NOTE — Progress Notes (Signed)
 It sounds like you have gastroenteritis. It may have been from food you ate within the past 24-48 hours. Your symptoms may last up to 48 hours. I would recommend staying hydrated. Drinking pedialyte for electrolyte replacement. Eating toast, chicken soup, plain white rice, applesauce, jello, etc.   We are sorry that you are not feeling well.  Here is how we plan to help!  Based on what you have shared with me it looks like you have Acute Infectious Diarrhea.  Most cases of acute diarrhea are due to infections with virus and bacteria and are self-limited conditions lasting less than 14 days.  For your symptoms you may take Imodium 2 mg tablets that are over the counter at your local pharmacy. Take two tablet now and then one after each loose stool up to 6 a day.  Antibiotics are not needed for most people with diarrhea.   HOME CARE We recommend changing your diet to help with your symptoms for the next few days. Drink plenty of fluids that contain water salt and sugar. Sports drinks such as Gatorade may help.  You may try broths, soups, bananas, applesauce, soft breads, mashed potatoes or crackers.  You are considered infectious for as long as the diarrhea continues. Hand washing or use of alcohol based hand sanitizers is recommend. It is best to stay out of work or school until your symptoms stop.   GET HELP RIGHT AWAY If you have dark yellow colored urine or do not pass urine frequently you should drink more fluids.   If your symptoms worsen  If you feel like you are going to pass out (faint) You have a new problem  MAKE SURE YOU  Understand these instructions. Will watch your condition. Will get help right away if you are not doing well or get worse.  Your e-visit answers were reviewed by a board certified advanced clinical practitioner to complete your personal care plan.  Depending on the condition, your plan could have included both over the counter or prescription  medications.  If there is a problem please reply  once you have received a response from your provider.  Your safety is important to us .  If you have drug allergies check your prescription carefully.    You can use MyChart to ask questions about today's visit, request a non-urgent call back, or ask for a work or school excuse for 24 hours related to this e-Visit. If it has been greater than 24 hours you will need to follow up with your provider, or enter a new e-Visit to address those concerns.   You will get an e-mail in the next two days asking about your experience.  I hope that your e-visit has been valuable and will speed your recovery. Thank you for using e-visits.   I have spent 5 minutes in review of e-visit questionnaire, review and updating patient chart, medical decision making and response to patient.   Dmario Russom W Mechel Schutter, NP

## 2024-06-03 DIAGNOSIS — R1084 Generalized abdominal pain: Secondary | ICD-10-CM | POA: Diagnosis not present

## 2024-06-04 MED ORDER — EZETIMIBE 10 MG PO TABS: 10.0000 mg | ORAL_TABLET | Freq: Every day | ORAL | 0 refills | Status: AC

## 2024-06-06 ENCOUNTER — Telehealth: Payer: Self-pay | Admitting: Family Medicine

## 2024-06-06 NOTE — Telephone Encounter (Unsigned)
 Copied from CRM #[id number]. Topic: Clinical - Medication Refill >> Jun 06, 2024  8:44 AM Myrick T wrote: Patients spouse request the 90 day supply  Medication: omeprazole  (PRILOSEC) 20 MG capsule  Has the patient contacted their pharmacy? No  This is the patient's preferred pharmacy:   [facility] - [place], [place] - [address] [place] [address] Phone: [phone] Fax: [phone]  Is this the correct pharmacy for this prescription? Yes  Has the prescription been filled recently? Yes  Is the patient out of the medication? No  Has the patient been seen for an appointment in the last year OR does the patient have an upcoming appointment? Yes  Can we respond through MyChart? Yes  Agent: Please be advised that Rx refills may take up to 3 business days. We ask that you follow-up with your pharmacy.

## 2024-06-08 MED ORDER — OMEPRAZOLE 20 MG PO CPDR: 20.0000 mg | DELAYED_RELEASE_CAPSULE | Freq: Every day | ORAL | 2 refills | Status: AC

## 2024-06-08 NOTE — Telephone Encounter (Signed)
 Requested Prescriptions  Pending Prescriptions Disp Refills   omeprazole  (PRILOSEC) 20 MG capsule 90 capsule 2    Sig: Take 1 capsule (20 mg total) by mouth daily.     Gastroenterology: Proton Pump Inhibitors Passed - 06/08/2024  3:19 PM      Passed - Valid encounter within last 12 months    Recent Outpatient Visits           1 month ago Subconjunctival hemorrhage of right eye   [facility] [place], Jon HERO, MD   3 months ago Encounter for annual physical exam   [facility] [place], Jon HERO, MD   9 months ago Primary hypertension   [facility] [place], Jon HERO, MD

## 2024-06-18 ENCOUNTER — Inpatient Hospital Stay: Admitting: Family Medicine

## 2024-06-22 DIAGNOSIS — D225 Melanocytic nevi of trunk: Secondary | ICD-10-CM | POA: Diagnosis not present

## 2024-06-22 DIAGNOSIS — L538 Other specified erythematous conditions: Secondary | ICD-10-CM | POA: Diagnosis not present

## 2024-06-22 DIAGNOSIS — D2261 Melanocytic nevi of right upper limb, including shoulder: Secondary | ICD-10-CM | POA: Diagnosis not present

## 2024-06-22 DIAGNOSIS — D2262 Melanocytic nevi of left upper limb, including shoulder: Secondary | ICD-10-CM | POA: Diagnosis not present

## 2024-06-22 DIAGNOSIS — D2272 Melanocytic nevi of left lower limb, including hip: Secondary | ICD-10-CM | POA: Diagnosis not present

## 2024-06-22 DIAGNOSIS — L820 Inflamed seborrheic keratosis: Secondary | ICD-10-CM | POA: Diagnosis not present

## 2024-06-22 DIAGNOSIS — L570 Actinic keratosis: Secondary | ICD-10-CM | POA: Diagnosis not present

## 2024-07-19 ENCOUNTER — Ambulatory Visit: Admitting: Family Medicine

## 2024-07-19 ENCOUNTER — Encounter: Payer: Self-pay | Admitting: Family Medicine

## 2024-07-19 VITALS — BP 105/68 | Resp 16 | Ht 71.0 in | Wt 204.0 lb

## 2024-07-19 DIAGNOSIS — E278 Other specified disorders of adrenal gland: Secondary | ICD-10-CM | POA: Diagnosis not present

## 2024-07-19 DIAGNOSIS — K8020 Calculus of gallbladder without cholecystitis without obstruction: Secondary | ICD-10-CM | POA: Diagnosis not present

## 2024-07-19 NOTE — Progress Notes (Signed)
 "     Established patient visit   [name]   DOB: July 07, 1950   74 y.o. Male  MRN: [id number] Visit Date: 07/19/2024  Today's healthcare provider: Jon Eva, MD   Chief Complaint  Patient presents with   Hospitalization Follow-up    Hosp f/u. No other concens   Subjective    HPI HPI     Hospitalization Follow-up    Additional comments: Hosp f/u. No other concens      Last edited by Marylen Odella LITTIE, CMA on 07/19/2024  2:55 PM.       Discussed the use of AI scribe software for clinical note transcription with the patient, who gave verbal consent to proceed.  History of Present Illness   Ryan English is a 74 year old male who presents for follow-up regarding gallstones and an adrenal gland nodule found on a recent CT scan.  In late November, he had severe abdominal pain and vomiting that led to an ER visit. CT showed gallstones in the gallbladder without infection or bile duct stones. He has not had recurrent pain since.  The CT also showed a 1.8 cm right adrenal nodule, increased from 1.2 cm on a 2015 scan. The left adrenal and kidneys were normal. He has no metal implants or pacemaker and can undergo MRI despite some discomfort.  He is scheduled for colonoscopy and upper endoscopy on January 28 for surveillance of prior tubular adenomas found three years ago. He will need to stop his blood thinner five days before the procedure.  He has heart stents with cardiology follow-up planned in April. He has had multiple knee surgeries and is reluctant to pursue recommended knee replacement surgery.  He feels overwhelmed by the number of medical visits and medications. 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         Medications: Show/hide medication list[1]  Review of Systems     Objective    BP 105/68 (BP Location: Left Arm, Patient Position: Sitting, Cuff Size: Large)   Resp  16   Ht 5' 11 (1.803 m)   Wt 204 lb (92.5 kg)   BMI 28.45 kg/m    Physical Exam Vitals reviewed.  Constitutional:      General: He is not in acute distress.    Appearance: Normal appearance. He is not diaphoretic.  HENT:     Head: Normocephalic and atraumatic.  Eyes:     General: No scleral icterus.    Conjunctiva/sclera: Conjunctivae normal.  Cardiovascular:     Rate and Rhythm: Normal rate and regular rhythm.     Heart sounds: Normal heart sounds. No murmur heard. Pulmonary:     Effort: Pulmonary effort is normal. No respiratory distress.     Breath sounds: Normal breath sounds. No wheezing or rhonchi.  Abdominal:     General: There is no distension.     Palpations: Abdomen is soft.     Tenderness: There is no abdominal tenderness.  Musculoskeletal:     Cervical back: Neck supple.     Right lower leg: No edema.     Left lower leg: No edema.  Lymphadenopathy:     Cervical: No cervical adenopathy.  Skin:    General: Skin is warm and dry.     Findings: No rash.  Neurological:     Mental Status: He is alert and oriented to person, place, and time. Mental status is at baseline.  Psychiatric:        Mood and Affect: Mood normal.        Behavior: Behavior normal.      No results found for any visits on 07/19/24.  Assessment & Plan     Problem List Items Addressed This Visit       Digestive   Gallstones     Other   Adrenal mass 1 cm to 4 cm in diameter - Primary   Relevant Orders   MR Abdomen W Wo Contrast        Adrenal mass A 1.8 cm nodule on the right adrenal gland, unchanged since 2015, suggests a benign adenoma. Further characterization is needed to rule out malignancy. - Ordered MRI with adrenal gland protocol to characterize the adrenal mass.  Gallstones Incidental finding of gallstones on CT scan without evidence of infection or obstruction. No recurrent episodes of right upper quadrant pain since initial presentation. - Continue to monitor for  symptoms of gallstone complications, such as right upper quadrant pain after eating. - Will consider cholecystectomy if symptoms develop.  General Health Maintenance Scheduled for colonoscopy and upper endoscopy due to history of precancerous polyps and dysphagia. Blood thinners to be held for five days prior to procedures. - Proceed with scheduled colonoscopy and upper endoscopy. - Hold blood thinners for five days prior to procedures.        Return for as scheduled.       Jon Eva, MD  [facility] [phone] (phone) [phone] (fax)  [facility]      [1]  Outpatient Medications Prior to Visit  Medication Sig   acetaminophen  (TYLENOL ) 500 MG tablet Take by mouth.   albuterol  (VENTOLIN  HFA) 108 (90 Base) MCG/ACT inhaler TAKE 2 PUFFS BY MOUTH EVERY 6 HOURS AS NEEDED FOR WHEEZE OR SHORTNESS OF BREATH   amLODipine  (NORVASC ) 2.5 MG tablet Take 1 tablet (2.5 mg total) by mouth daily.   aspirin  EC 81 MG tablet Take by mouth.   atorvastatin (LIPITOR) 40 MG tablet Take 40 mg by mouth daily.   clopidogrel (PLAVIX) 75 MG tablet Take 75 mg by mouth daily.   Dextromethorphan HBr (DELSYM PO) Take by mouth as needed.   ezetimibe  (ZETIA ) 10 MG tablet Take 1 tablet (10 mg total) by mouth daily.   ipratropium (ATROVENT ) 0.03 % nasal spray Place 2 sprays into both nostrils every 12 (twelve) hours.   loratadine  (CLARITIN  REDITABS) 10 MG dissolvable tablet Dissolve 1 tablet (10 mg total) in the mouth daily.   losartan  (COZAAR ) 50 MG tablet TAKE 1 TABLET BY MOUTH ONCE DAILY   Misc Natural Products (OSTEO BI-FLEX TRIPLE STRENGTH) TABS Take 2 tablets by mouth daily.   montelukast  (SINGULAIR ) 10 MG tablet TAKE 1 TABLET BY MOUTH NIGHTLY   nitroGLYCERIN  (NITROSTAT ) 0.4 MG SL tablet Place under the tongue.   omeprazole  (PRILOSEC) 20 MG capsule Take 1 capsule (20 mg total) by mouth daily.   prasugrel (EFFIENT) 10 MG TABS tablet Take 1 tablet by  mouth daily.   Pseudoephedrine-guaiFENesin (MUCINEX D PO) Take by mouth as needed.   No facility-administered medications prior to visit.   "

## 2024-07-23 ENCOUNTER — Ambulatory Visit
Admission: RE | Admit: 2024-07-23 | Discharge: 2024-07-23 | Disposition: A | Discharge status: Home / Self Care | Source: Ambulatory Visit | Attending: Family Medicine | Admitting: Family Medicine

## 2024-07-23 DIAGNOSIS — E278 Other specified disorders of adrenal gland: Secondary | ICD-10-CM | POA: Insufficient documentation

## 2024-07-23 MED ORDER — GADOBUTROL 1 MMOL/ML IV SOLN
9.0000 mL | Freq: Once | INTRAVENOUS | Status: AC | PRN
  Administered 2024-07-23: 9 mL via INTRAVENOUS

## 2024-07-24 ENCOUNTER — Ambulatory Visit: Payer: Self-pay | Admitting: Family Medicine

## 2024-08-01 ENCOUNTER — Ambulatory Visit
Admission: RE | Admit: 2024-08-01 | Discharge: 2024-08-01 | Disposition: A | Discharge status: Home / Self Care | Attending: Gastroenterology | Admitting: Gastroenterology

## 2024-08-01 ENCOUNTER — Ambulatory Visit: Admitting: Certified Registered"

## 2024-08-01 ENCOUNTER — Encounter
Admission: RE | Disposition: A | Discharge status: Home / Self Care | Payer: Self-pay | Source: Home / Self Care | Attending: Gastroenterology

## 2024-08-01 ENCOUNTER — Other Ambulatory Visit: Payer: Self-pay

## 2024-08-01 ENCOUNTER — Encounter: Payer: Self-pay | Admitting: Gastroenterology

## 2024-08-01 DIAGNOSIS — Z7902 Long term (current) use of antithrombotics/antiplatelets: Secondary | ICD-10-CM | POA: Diagnosis not present

## 2024-08-01 DIAGNOSIS — R1314 Dysphagia, pharyngoesophageal phase: Secondary | ICD-10-CM | POA: Insufficient documentation

## 2024-08-01 DIAGNOSIS — I10 Essential (primary) hypertension: Secondary | ICD-10-CM | POA: Diagnosis not present

## 2024-08-01 DIAGNOSIS — D123 Benign neoplasm of transverse colon: Secondary | ICD-10-CM | POA: Diagnosis not present

## 2024-08-01 DIAGNOSIS — K2100 Gastro-esophageal reflux disease with esophagitis, without bleeding: Secondary | ICD-10-CM | POA: Insufficient documentation

## 2024-08-01 DIAGNOSIS — K5730 Diverticulosis of large intestine without perforation or abscess without bleeding: Secondary | ICD-10-CM | POA: Insufficient documentation

## 2024-08-01 DIAGNOSIS — I2510 Atherosclerotic heart disease of native coronary artery without angina pectoris: Secondary | ICD-10-CM | POA: Diagnosis not present

## 2024-08-01 MED ORDER — LIDOCAINE HCL (PF) 2 % IJ SOLN
INTRAMUSCULAR | Status: DC | PRN
  Administered 2024-08-01: 60 mg via INTRADERMAL

## 2024-08-01 MED ORDER — SODIUM CHLORIDE 0.9 % IV SOLN: INTRAVENOUS | Status: DC

## 2024-08-01 MED ORDER — PROPOFOL 10 MG/ML IV BOLUS
INTRAVENOUS | Status: DC | PRN
  Administered 2024-08-01: 20 mg via INTRAVENOUS
  Administered 2024-08-01: 50 mg via INTRAVENOUS
  Administered 2024-08-01: 40 mg via INTRAVENOUS
  Administered 2024-08-01: 30 mg via INTRAVENOUS
  Administered 2024-08-01 (×3): 20 mg via INTRAVENOUS

## 2024-08-01 NOTE — Anesthesia Preprocedure Evaluation (Signed)
"                                    Anesthesia Evaluation  Patient identified by MRN, date of birth, ID band Patient awake    Reviewed: Allergy & Precautions, H&P , NPO status , Patient's Chart, lab work & pertinent test results, reviewed documented beta blocker date and time   History of Anesthesia Complications Negative for: history of anesthetic complications  Airway Mallampati: II  TM Distance: >3 FB Neck ROM: full    Dental  (+) Dental Advidsory Given, Teeth Intact, Caps   Pulmonary neg pulmonary ROS   Pulmonary exam normal breath sounds clear to auscultation       Cardiovascular Exercise Tolerance: Good hypertension, (-) angina + CAD and + Cardiac Stents  (-) Past MI and (-) CABG Normal cardiovascular exam(-) dysrhythmias  Rhythm:regular Rate:Normal     Neuro/Psych negative neurological ROS  negative psych ROS   GI/Hepatic Neg liver ROS,GERD  ,,  Endo/Other  negative endocrine ROS    Renal/GU negative Renal ROS  negative genitourinary   Musculoskeletal   Abdominal   Peds  Hematology negative hematology ROS (+)   Anesthesia Other Findings Past Medical History: No date: Arthritis No date: GERD (gastroesophageal reflux disease) No date: Hyperlipidemia   Reproductive/Obstetrics negative OB ROS                              Anesthesia Physical Anesthesia Plan  ASA: 2  Anesthesia Plan: General   Post-op Pain Management:    Induction: Intravenous  PONV Risk Score and Plan: 2 and TIVA and Propofol  infusion  Airway Management Planned: Natural Airway and Nasal Cannula  Additional Equipment:   Intra-op Plan:   Post-operative Plan:   Informed Consent: I have reviewed the patients History and Physical, chart, labs and discussed the procedure including the risks, benefits and alternatives for the proposed anesthesia with the patient or authorized representative who has indicated his/her understanding and  acceptance.     Dental Advisory Given  Plan Discussed with: Anesthesiologist, CRNA and Surgeon  Anesthesia Plan Comments:          Anesthesia Quick Evaluation  "

## 2024-08-01 NOTE — Anesthesia Postprocedure Evaluation (Signed)
"   Anesthesia Post Note  [name]  Procedure(s) Performed: COLONOSCOPY EGD (ESOPHAGOGASTRODUODENOSCOPY) POLYPECTOMY, INTESTINE  Patient location during evaluation: Endoscopy Anesthesia Type: General Level of consciousness: awake and alert Pain management: pain level controlled Vital Signs Assessment: post-procedure vital signs reviewed and stable Respiratory status: spontaneous breathing, nonlabored ventilation, respiratory function stable and patient connected to nasal cannula oxygen Cardiovascular status: blood pressure returned to baseline and stable Postop Assessment: no apparent nausea or vomiting Anesthetic complications: no   No notable events documented.   Last Vitals:  Vitals:   08/01/24 0923 08/01/24 0933  BP: 96/62 104/73  Pulse: (!) 58 (!) 53  Resp: 14 16  Temp:    SpO2: 97% 97%    Last Pain:  Vitals:   08/01/24 0923  TempSrc:   PainSc: 0-No pain                 [name]      "

## 2024-08-01 NOTE — Op Note (Signed)
 [facility] Gastroenterology Patient Name: Ryan English Procedure Date: 08/01/2024 8:14 AM MRN: [id number] Account #: [id number] Date of Birth: 07-Mar-1951 Admit Type: Outpatient Age: 74 Room: [facility] ENDO ROOM 4 Gender: Male Note Status: Finalized Instrument Name: Upper GI Scope [id number] Procedure:             Upper GI endoscopy Indications:           Dysphagia, Follow-up of gastro-esophageal reflux                         disease Providers:             Corinn Jess Brooklyn MD, MD Referring MD:          Jon HERO. Bacigalupo (Referring MD) Medicines:             General Anesthesia Complications:         No immediate complications. Estimated blood loss: None. Procedure:             Pre-Anesthesia Assessment:                        - Prior to the procedure, a History and Physical was                         performed, and patient medications and allergies were                         reviewed. The patient is competent. The risks and                         benefits of the procedure and the sedation options and                         risks were discussed with the patient. All questions                         were answered and informed consent was obtained.                         Patient identification and proposed procedure were                         verified by the physician, the nurse, the                         anesthesiologist, the anesthetist and the technician                         in the pre-procedure area in the procedure room in the                         endoscopy suite. Mental Status Examination: alert and                         oriented. Airway Examination: normal oropharyngeal                         airway and neck mobility. Respiratory Examination:  clear to auscultation. CV Examination: normal.                         Prophylactic Antibiotics: The patient does not require                         prophylactic antibiotics.  Prior Anticoagulants: The                         patient has taken Plavix (clopidogrel), last dose was                         5 days prior to procedure. ASA Grade Assessment: III -                         A patient with severe systemic disease. After                         reviewing the risks and benefits, the patient was                         deemed in satisfactory condition to undergo the                         procedure. The anesthesia plan was to use general                         anesthesia. Immediately prior to administration of                         medications, the patient was re-assessed for adequacy                         to receive sedatives. The heart rate, respiratory                         rate, oxygen saturations, blood pressure, adequacy of                         pulmonary ventilation, and response to care were                         monitored throughout the procedure. The physical                         status of the patient was re-assessed after the                         procedure.                        After obtaining informed consent, the endoscope was                         passed under direct vision. Throughout the procedure,                         the patient's blood pressure, pulse, and oxygen  saturations were monitored continuously. The Endoscope                         was introduced through the mouth, and advanced to the                         second part of duodenum. The upper GI endoscopy was                         accomplished without difficulty. The patient tolerated                         the procedure well. Findings:      The esophagus, gastroesophageal junction and examined esophagus were       normal.      The stomach was normal.      The examined duodenum was normal.      Esophagogastric landmarks were identified: the gastroesophageal junction       was found at 45 cm from the incisors.      The cardia and  gastric fundus were normal on retroflexion. Impression:            - Normal esophagus and gastroesophageal junction.                        - Normal stomach.                        - Normal examined duodenum.                        - Esophagogastric landmarks identified.                        - No specimens collected. Recommendation:        - Discharge patient to home (with escort).                        - Resume previous diet today.                        - Continue present medications.                        - Proceed with colonoscopy as scheduled                        See colonoscopy report Procedure Code(s):     --- Professional ---                        [phone], Esophagogastroduodenoscopy, flexible,                         transoral; diagnostic, including collection of                         specimen(s) by brushing or washing, when performed                         (separate procedure) Diagnosis Code(s):     --- Professional ---  R13.10, Dysphagia, unspecified                        K21.9, Gastro-esophageal reflux disease without                         esophagitis CPT copyright 2022 American Medical Association. All rights reserved. The codes documented in this report are preliminary and upon coder review may  be revised to meet current compliance requirements. Dr. Corinn Brooklyn Corinn Jess Brooklyn MD, MD 08/01/2024 8:39:17 AM This report has been signed electronically. Number of Addenda: 0 Note Initiated On: 08/01/2024 8:14 AM Estimated Blood Loss:  Estimated blood loss: none.      [facility]

## 2024-08-01 NOTE — H&P (Signed)
 "   Ryan JONELLE Brooklyn, MD [facility], DHIP [address]  [place], [place] [address]  Main: [phone] Fax:  [phone] Pager: [phone]   Primary Care Physician:  [name], MD Primary Gastroenterologist:  Dr. Corinn JONELLE English  Pre-Procedure History & Physical: HPI:  Ryan English is a 74 y.o. male is here for an endoscopy and colonoscopy.   Past Medical History:  Diagnosis Date   Anginal pain    Arthritis    GERD (gastroesophageal reflux disease)    Hyperlipidemia     Past Surgical History:  Procedure Laterality Date   APPENDECTOMY  1999   COLONOSCOPY WITH PROPOFOL  N/A 04/08/2021   Procedure: COLONOSCOPY WITH PROPOFOL ;  Surgeon: English Ryan Skiff, MD;  Location: [facility] ENDOSCOPY;  Service: Gastroenterology;  Laterality: N/A;   LEFT HEART CATH AND CORONARY ANGIOGRAPHY Left 07/08/2023   Procedure: LEFT HEART CATH AND CORONARY ANGIOGRAPHY;  Surgeon: Ryan Bruckner, MD;  Location: [facility] INVASIVE CV LAB;  Service: Cardiovascular;  Laterality: Left;    Prior to Admission medications  Medication Sig Start Date End Date Taking? Authorizing Provider  acetaminophen  (TYLENOL ) 500 MG tablet Take by mouth.    [provider]  albuterol  (VENTOLIN  HFA) 108 (90 Base) MCG/ACT inhaler TAKE 2 PUFFS BY MOUTH EVERY 6 HOURS AS NEEDED FOR WHEEZE OR SHORTNESS OF BREATH 05/02/23   English, Jon HERO, MD  amLODipine  (NORVASC ) 2.5 MG tablet Take 1 tablet (2.5 mg total) by mouth daily. 07/08/23 07/19/24  End, Bruckner, MD  aspirin  EC 81 MG tablet Take by mouth.    [provider]  atorvastatin (LIPITOR) 40 MG tablet Take 40 mg by mouth daily. 02/20/13   [provider]  clopidogrel (PLAVIX) 75 MG tablet Take 75 mg by mouth daily. 04/12/24 04/12/25  [provider]  Dextromethorphan HBr (DELSYM PO) Take by mouth as needed.    [provider]  ezetimibe  (ZETIA ) 10 MG tablet Take 1 tablet (10 mg total) by mouth daily.  06/04/24   Ryan Frederick, NP  ipratropium (ATROVENT ) 0.03 % nasal spray Place 2 sprays into both nostrils every 12 (twelve) hours. 12/10/22   Ostwalt, Janna, PA-C  loratadine  (CLARITIN  REDITABS) 10 MG dissolvable tablet Dissolve 1 tablet (10 mg total) in the mouth daily.    [provider]  losartan  (COZAAR ) 50 MG tablet TAKE 1 TABLET BY MOUTH ONCE DAILY 02/29/24   English, Ryan M, MD  Misc Natural Products (OSTEO BI-FLEX TRIPLE STRENGTH) TABS Take 2 tablets by mouth daily.    [provider]  montelukast  (SINGULAIR ) 10 MG tablet TAKE 1 TABLET BY MOUTH NIGHTLY 03/27/24   English, Jon HERO, MD  nitroGLYCERIN  (NITROSTAT ) 0.4 MG SL tablet Place under the tongue. 08/12/23 08/11/24  [provider]  omeprazole  (PRILOSEC) 20 MG capsule Take 1 capsule (20 mg total) by mouth daily. 06/08/24   English, Ryan M, MD  prasugrel (EFFIENT) 10 MG TABS tablet Take 1 tablet by mouth daily. 09/19/23   [provider]  Pseudoephedrine-guaiFENesin [facility] D PO) Take by mouth as needed.    [provider]    Allergies as of 07/17/2024   (No Known Allergies)    Family History  Problem Relation Age of Onset   Cancer Mother    Diabetes Sister    Diabetes Sister    Heart disease Brother        MI   Heart disease Brother 61   Diabetes Brother    Diabetes Brother     Social  History   Socioeconomic History   Marital status: Married    Spouse name: Not on file   Number of children: 2   Years of education: Not on file   Highest education level: 12th grade  Occupational History   Not on file  Tobacco Use   Smoking status: Never   Smokeless tobacco: Never  Vaping Use   Vaping status: Never Used  Substance and Sexual Activity   Alcohol use: Yes    Alcohol/week: 6.0 standard drinks of alcohol    Types: 6 Cans of beer per week    Comment: occasionally   Drug use: No   Sexual activity: Not on file  Other Topics Concern   Not on file  Social History  Narrative   ** Merged History Encounter **       Social Drivers of Health   Tobacco Use: Low Risk (08/01/2024)   Patient History    Smoking Tobacco Use: Never    Smokeless Tobacco Use: Never    Passive Exposure: Not on file  Financial Resource Strain: Low Risk  (07/17/2024)   Received from [facility]   Overall Financial Resource Strain (CARDIA)    Difficulty of Paying Living Expenses: Not hard at all  Food Insecurity: No Food Insecurity (07/17/2024)   Received from [facility]   Epic    Within the past 12 months, you worried that your food would run out before you got the money to buy more.: Never true    Within the past 12 months, the food you bought just didn't last and you didn't have money to get more.: Never true  Transportation Needs: No Transportation Needs (07/17/2024)   Received from [facility] - Transportation    In the past 12 months, has lack of transportation kept you from medical appointments or from getting medications?: No    Lack of Transportation (Non-Medical): No  Physical Activity: Sufficiently Active (01/24/2024)   Exercise Vital Sign    Days of Exercise per Week: 7 days    Minutes of Exercise per Session: 60 min  Stress: No Stress Concern Present (01/24/2024)   [employer] of [employer]ational [employer] - Occupational Stress Questionnaire    Feeling of Stress: Not at all  Social Connections: Unknown (01/24/2024)   Social Connection and Isolation Panel    Frequency of Communication with Friends and Family: Not on file    Frequency of Social Gatherings with Friends and Family: Not on file    Attends Religious Services: Not on file    Active Member of Clubs or Organizations: Yes    Attends Banker Meetings: 1 to 4 times per year    Marital Status: Not on file  Recent Concern: Social Connections - Moderately Isolated (01/24/2024)   Social Connection and Isolation Panel    Frequency of  Communication with Friends and Family: More than three times a week    Frequency of Social Gatherings with Friends and Family: Three times a week    Attends Religious Services: Never    Active Member of Clubs or Organizations: No    Attends Banker Meetings: Never    Marital Status: Married  Catering Manager Violence: Not At Risk (01/24/2024)   Epic    Fear of Current or Ex-Partner: No    Emotionally Abused: No    Physically Abused: No    Sexually Abused: No  Depression (PHQ2-9): Low Risk (07/19/2024)   Depression (PHQ2-9)  PHQ-2 Score: 0  Alcohol Screen: Low Risk (01/24/2024)   Alcohol Screen    Last Alcohol Screening Score (AUDIT): 4  Housing: Low Risk  (07/17/2024)   Received [facility]   Epic    In the last 12 months, was there a time when you were not able to pay the mortgage or rent on time?: No    In the past 12 months, how many times have you moved where you were living?: 0    At any time in the past 12 months, were you homeless or living in a shelter (including now)?: No  Utilities: Not At Risk (07/17/2024)   Received from [facility]   Epic    In the past 12 months has the electric, gas, oil, or water company threatened to shut off services in your home?: No  Health Literacy: Adequate Health Literacy (01/24/2024)   B1300 Health Literacy    Frequency of need for help with medical instructions: Never    Review of Systems: See HPI, otherwise negative ROS  Physical Exam: BP 101/72   Pulse (!) 59   Temp 97.6 F (36.4 C) (Temporal)   Resp 16   Ht 5' 11 (1.803 m)   Wt 90.7 kg   SpO2 97%   BMI 27.89 kg/m  General:   Alert,  pleasant and cooperative in NAD Head:  Normocephalic and atraumatic. Neck:  Supple; no masses or thyromegaly. Lungs:  Clear throughout to auscultation.    Heart:  Regular rate and rhythm. Abdomen:  Soft, nontender and nondistended. Normal bowel sounds, without guarding, and without rebound.    Neurologic:  Alert and  oriented x4;  grossly normal neurologically.  Impression/Plan: Vearl L Botting is here for an endoscopy and colonoscopy to be performed for Esophageal dysphagia, Gastroesophageal reflux disease, Personal history of adenomatous colon polyps   Risks, benefits, limitations, and alternatives regarding  endoscopy and colonoscopy have been reviewed with the patient.  Questions have been answered.  All parties agreeable.   Ryan Brooklyn, MD  08/01/2024, 8:14 AM "

## 2024-08-01 NOTE — Op Note (Signed)
 [facility] Patient Name: Ryan English Procedure Date: 08/01/2024 8:13 AM MRN: [id number] Account #: [id number] Date of Birth: 1951/01/11 Admit Type: Outpatient Age: 74 Room: [facility] ENDO ROOM 4 Gender: Male Note Status: Finalized Instrument Name: Colon Scope [id number] Procedure:             Colonoscopy Indications:           Surveillance: Personal history of colonic polyps                         (unknown histology) on last colonoscopy 3 years ago,                         Last colonoscopy: October 2022 Providers:             Corinn Jess Brooklyn MD, MD Referring MD:          Jon HERO. Bacigalupo (Referring MD) Medicines:             General Anesthesia Complications:         No immediate complications. Estimated blood loss: None. Procedure:             Pre-Anesthesia Assessment:                        - Prior to the procedure, a History and Physical was                         performed, and patient medications and allergies were                         reviewed. The patient is competent. The risks and                         benefits of the procedure and the sedation options and                         risks were discussed with the patient. All questions                         were answered and informed consent was obtained.                         Patient identification and proposed procedure were                         verified by the physician, the nurse, the                         anesthesiologist, the anesthetist and the technician                         in the pre-procedure area in the procedure room in the                         endoscopy suite. Mental Status Examination: alert and                         oriented. Airway Examination: normal oropharyngeal  airway and neck mobility. Respiratory Examination:                         clear to auscultation. CV Examination: normal.                         Prophylactic  Antibiotics: The patient does not require                         prophylactic antibiotics. Prior Anticoagulants: The                         patient has taken Plavix (clopidogrel), last dose was                         5 days prior to procedure. ASA Grade Assessment: III -                         A patient with severe systemic disease. After                         reviewing the risks and benefits, the patient was                         deemed in satisfactory condition to undergo the                         procedure. The anesthesia plan was to use general                         anesthesia. Immediately prior to administration of                         medications, the patient was re-assessed for adequacy                         to receive sedatives. The heart rate, respiratory                         rate, oxygen saturations, blood pressure, adequacy of                         pulmonary ventilation, and response to care were                         monitored throughout the procedure. The physical                         status of the patient was re-assessed after the                         procedure.                        After obtaining informed consent, the colonoscope was                         passed under direct vision. Throughout the procedure,  the patient's blood pressure, pulse, and oxygen                         saturations were monitored continuously. The                         Colonoscope was introduced through the anus and                         advanced to the the cecum, identified by appendiceal                         orifice and ileocecal valve. The colonoscopy was                         performed without difficulty. The patient tolerated                         the procedure well. The quality of the bowel                         preparation was evaluated using the BBPS [facility] Bowel                         Preparation Scale) with scores of:  Right Colon = 3,                         Transverse Colon = 3 and Left Colon = 3 (entire mucosa                         seen well with no residual staining, small fragments                         of stool or opaque liquid). The total BBPS score                         equals 9. The ileocecal valve, appendiceal orifice,                         and rectum were photographed. Findings:      The perianal and digital rectal examinations were normal. Pertinent       negatives include normal sphincter tone and no palpable rectal lesions.      A diminutive polyp was found in the transverse colon. The polyp was       sessile. The polyp was removed with a jumbo cold forceps. Resection and       retrieval were complete. Estimated blood loss: none.      Multiple small-mouthed diverticula were found in the sigmoid colon.      The retroflexed view of the distal rectum and anal verge was normal and       showed no anal or rectal abnormalities. Impression:            - One diminutive polyp in the transverse colon,                         removed with a jumbo cold forceps. Resected and  retrieved.                        - Diverticulosis in the sigmoid colon.                        - The distal rectum and anal verge are normal on                         retroflexion view. Recommendation:        - Discharge patient to home (with escort).                        - Resume previous diet today.                        - Continue present medications.                        - Await pathology results.                        - Resume Plavix (clopidogrel) today at prior dose.                         Refer to managing physician for further adjustment of                         therapy. Procedure Code(s):     --- Professional ---                        [phone], Colonoscopy, flexible; with biopsy, single or                         multiple Diagnosis Code(s):     --- Professional ---                         Z86.010, Personal history of colonic polyps                        D12.3, Benign neoplasm of transverse colon (hepatic                         flexure or splenic flexure)                        K57.30, Diverticulosis of large intestine without                         perforation or abscess without bleeding CPT copyright 2022 American Medical Association. All rights reserved. The codes documented in this report are preliminary and upon coder review may  be revised to meet current compliance requirements. Dr. Corinn Brooklyn Corinn Jess Brooklyn MD, MD 08/01/2024 8:52:51 AM This report has been signed electronically. Number of Addenda: 0 Note Initiated On: 08/01/2024 8:13 AM Scope Withdrawal Time: 0 hours 6 minutes 5 seconds  Total Procedure Duration: 0 hours 9 minutes 7 seconds  Estimated Blood Loss:  Estimated blood loss: none.      [facility]

## 2024-08-01 NOTE — Transfer of Care (Signed)
 Immediate Anesthesia Transfer of Care Note  [name]  Procedure(s) Performed: COLONOSCOPY EGD (ESOPHAGOGASTRODUODENOSCOPY) POLYPECTOMY, INTESTINE  Patient Location: PACU and Endoscopy Unit  Anesthesia Type:MAC  Level of Consciousness: drowsy  Airway & Oxygen Therapy: Patient Spontanous Breathing and Patient connected to face mask oxygen  Post-op Assessment: Report given to RN and Post -op Vital signs reviewed and stable  Post vital signs: Reviewed and stable  Last Vitals:  Vitals Value Taken Time  BP 126/94 08/01/24 08:54  Temp    Pulse 54 08/01/24 08:54  Resp 15 08/01/24 08:54  SpO2 98 % 08/01/24 08:54    Last Pain:  Vitals:   08/01/24 0759  TempSrc: Temporal  PainSc: 0-No pain         Complications: No notable events documented.

## 2024-08-02 LAB — SURGICAL PATHOLOGY

## 2024-08-07 ENCOUNTER — Ambulatory Visit: Payer: Self-pay | Admitting: Gastroenterology

## 2025-01-29 ENCOUNTER — Ambulatory Visit

## 2025-02-21 ENCOUNTER — Encounter: Admitting: Family Medicine
